# Patient Record
Sex: Female | Born: 1945 | Race: White | Hispanic: No | Marital: Married | Smoking: Never smoker
Health system: Southern US, Community
[De-identification: ages and names within clinical notes are randomized; demographics above are authoritative.]

## PROBLEM LIST (undated history)

## (undated) DIAGNOSIS — J302 Other seasonal allergic rhinitis: Secondary | ICD-10-CM

## (undated) DIAGNOSIS — F32A Depression, unspecified: Secondary | ICD-10-CM

## (undated) DIAGNOSIS — J45909 Unspecified asthma, uncomplicated: Secondary | ICD-10-CM

## (undated) DIAGNOSIS — E785 Hyperlipidemia, unspecified: Secondary | ICD-10-CM

## (undated) DIAGNOSIS — F419 Anxiety disorder, unspecified: Secondary | ICD-10-CM

## (undated) DIAGNOSIS — G4700 Insomnia, unspecified: Secondary | ICD-10-CM

## (undated) DIAGNOSIS — I4510 Unspecified right bundle-branch block: Secondary | ICD-10-CM

## (undated) DIAGNOSIS — R410 Disorientation, unspecified: Secondary | ICD-10-CM

## (undated) DIAGNOSIS — F329 Major depressive disorder, single episode, unspecified: Secondary | ICD-10-CM

## (undated) HISTORY — DX: Insomnia, unspecified: G47.00

## (undated) HISTORY — DX: Hyperlipidemia, unspecified: E78.5

## (undated) HISTORY — PX: COLONOSCOPY: SHX174

## (undated) HISTORY — DX: Unspecified asthma, uncomplicated: J45.909

## (undated) HISTORY — DX: Unspecified right bundle-branch block: I45.10

## (undated) HISTORY — DX: Depression, unspecified: F32.A

## (undated) HISTORY — DX: Anxiety disorder, unspecified: F41.9

## (undated) HISTORY — DX: Other seasonal allergic rhinitis: J30.2

## (undated) HISTORY — DX: Disorientation, unspecified: R41.0

## (undated) HISTORY — DX: Major depressive disorder, single episode, unspecified: F32.9

---

## 1997-12-30 ENCOUNTER — Other Ambulatory Visit: Admission: RE | Admit: 1997-12-30 | Discharge: 1997-12-30 | Payer: Self-pay | Admitting: Gynecology

## 1998-07-18 ENCOUNTER — Other Ambulatory Visit: Admission: RE | Admit: 1998-07-18 | Discharge: 1998-07-18 | Payer: Self-pay | Admitting: Gynecology

## 1999-01-05 ENCOUNTER — Ambulatory Visit (HOSPITAL_COMMUNITY): Admission: RE | Admit: 1999-01-05 | Discharge: 1999-01-05 | Payer: Self-pay | Admitting: Gastroenterology

## 1999-09-25 ENCOUNTER — Other Ambulatory Visit: Admission: RE | Admit: 1999-09-25 | Discharge: 1999-09-25 | Payer: Self-pay | Admitting: Gynecology

## 2000-01-14 ENCOUNTER — Other Ambulatory Visit: Admission: RE | Admit: 2000-01-14 | Discharge: 2000-01-14 | Payer: Self-pay | Admitting: Gynecology

## 2000-09-26 ENCOUNTER — Other Ambulatory Visit: Admission: RE | Admit: 2000-09-26 | Discharge: 2000-09-26 | Payer: Self-pay | Admitting: Gynecology

## 2001-09-21 ENCOUNTER — Other Ambulatory Visit: Admission: RE | Admit: 2001-09-21 | Discharge: 2001-09-21 | Payer: Self-pay | Admitting: Gynecology

## 2002-10-18 ENCOUNTER — Other Ambulatory Visit: Admission: RE | Admit: 2002-10-18 | Discharge: 2002-10-18 | Payer: Self-pay | Admitting: Gynecology

## 2003-10-21 ENCOUNTER — Other Ambulatory Visit: Admission: RE | Admit: 2003-10-21 | Discharge: 2003-10-21 | Payer: Self-pay | Admitting: Gynecology

## 2004-07-22 ENCOUNTER — Ambulatory Visit: Payer: Self-pay | Admitting: Internal Medicine

## 2004-10-06 ENCOUNTER — Ambulatory Visit (HOSPITAL_COMMUNITY): Admission: RE | Admit: 2004-10-06 | Discharge: 2004-10-06 | Payer: Self-pay | Admitting: Internal Medicine

## 2004-10-06 ENCOUNTER — Ambulatory Visit: Payer: Self-pay | Admitting: Internal Medicine

## 2004-11-30 ENCOUNTER — Other Ambulatory Visit: Admission: RE | Admit: 2004-11-30 | Discharge: 2004-11-30 | Payer: Self-pay | Admitting: Gynecology

## 2004-12-24 ENCOUNTER — Ambulatory Visit: Payer: Self-pay | Admitting: Internal Medicine

## 2005-02-01 ENCOUNTER — Ambulatory Visit: Payer: Self-pay | Admitting: Internal Medicine

## 2005-12-27 ENCOUNTER — Ambulatory Visit: Payer: Self-pay | Admitting: Internal Medicine

## 2005-12-27 ENCOUNTER — Other Ambulatory Visit: Admission: RE | Admit: 2005-12-27 | Discharge: 2005-12-27 | Payer: Self-pay | Admitting: Gynecology

## 2006-01-06 ENCOUNTER — Ambulatory Visit: Payer: Self-pay | Admitting: Endocrinology

## 2006-01-10 ENCOUNTER — Ambulatory Visit: Payer: Self-pay | Admitting: Internal Medicine

## 2006-02-07 ENCOUNTER — Ambulatory Visit: Payer: Self-pay | Admitting: Pulmonary Disease

## 2006-02-16 ENCOUNTER — Ambulatory Visit: Payer: Self-pay | Admitting: Pulmonary Disease

## 2006-12-02 ENCOUNTER — Encounter: Payer: Self-pay | Admitting: Endocrinology

## 2006-12-02 DIAGNOSIS — F418 Other specified anxiety disorders: Secondary | ICD-10-CM | POA: Insufficient documentation

## 2007-02-06 ENCOUNTER — Ambulatory Visit: Payer: Self-pay | Admitting: Internal Medicine

## 2007-02-06 DIAGNOSIS — M79609 Pain in unspecified limb: Secondary | ICD-10-CM | POA: Insufficient documentation

## 2007-02-06 DIAGNOSIS — G4700 Insomnia, unspecified: Secondary | ICD-10-CM | POA: Insufficient documentation

## 2007-02-07 ENCOUNTER — Encounter: Payer: Self-pay | Admitting: Internal Medicine

## 2007-02-07 ENCOUNTER — Telehealth: Payer: Self-pay | Admitting: Internal Medicine

## 2007-03-17 ENCOUNTER — Ambulatory Visit: Payer: Self-pay | Admitting: Internal Medicine

## 2007-03-20 ENCOUNTER — Encounter (INDEPENDENT_AMBULATORY_CARE_PROVIDER_SITE_OTHER): Payer: Self-pay | Admitting: *Deleted

## 2007-04-03 ENCOUNTER — Encounter: Admission: RE | Admit: 2007-04-03 | Discharge: 2007-04-03 | Payer: Self-pay | Admitting: Obstetrics

## 2007-04-03 ENCOUNTER — Encounter: Payer: Self-pay | Admitting: Internal Medicine

## 2007-04-03 ENCOUNTER — Ambulatory Visit: Payer: Self-pay | Admitting: Internal Medicine

## 2007-07-11 ENCOUNTER — Encounter: Payer: Self-pay | Admitting: Internal Medicine

## 2007-08-24 ENCOUNTER — Encounter: Payer: Self-pay | Admitting: Internal Medicine

## 2007-08-28 ENCOUNTER — Ambulatory Visit: Payer: Self-pay | Admitting: Internal Medicine

## 2007-08-28 DIAGNOSIS — J069 Acute upper respiratory infection, unspecified: Secondary | ICD-10-CM | POA: Insufficient documentation

## 2007-09-06 ENCOUNTER — Encounter: Payer: Self-pay | Admitting: Internal Medicine

## 2007-09-07 ENCOUNTER — Ambulatory Visit: Payer: Self-pay | Admitting: Internal Medicine

## 2007-09-09 LAB — CONVERTED CEMR LAB
BUN: 11 mg/dL (ref 6–23)
Basophils Absolute: 0.1 10*3/uL (ref 0.0–0.1)
Calcium: 10 mg/dL (ref 8.4–10.5)
Creatinine, Ser: 0.8 mg/dL (ref 0.4–1.2)
Direct LDL: 117.8 mg/dL
Eosinophils Relative: 2.3 % (ref 0.0–5.0)
GFR calc Af Amer: 94 mL/min
GFR calc non Af Amer: 78 mL/min
Glucose, Bld: 105 mg/dL — ABNORMAL HIGH (ref 70–99)
HDL: 57.7 mg/dL (ref >39.0)
Lymphocytes Relative: 28 % (ref 12.0–46.0)
MCV: 87.4 fL (ref 78.0–100.0)
Monocytes Relative: 10.3 % (ref 3.0–12.0)
Neutro Abs: 3.7 10*3/uL (ref 1.4–7.7)
Neutrophils Relative %: 58.6 % (ref 43.0–77.0)
RBC: 4.38 M/uL (ref 3.87–5.11)
Triglycerides: 182 mg/dL — ABNORMAL HIGH (ref 0–149)
WBC: 6.4 10*3/uL (ref 4.5–10.5)

## 2007-09-28 ENCOUNTER — Ambulatory Visit: Payer: Self-pay | Admitting: Internal Medicine

## 2007-10-27 ENCOUNTER — Telehealth: Payer: Self-pay | Admitting: Internal Medicine

## 2008-04-15 ENCOUNTER — Ambulatory Visit: Payer: Self-pay | Admitting: Internal Medicine

## 2008-04-15 LAB — CONVERTED CEMR LAB
ALT: 71 units/L — ABNORMAL HIGH (ref 0–35)
AST: 41 units/L — ABNORMAL HIGH (ref 0–37)
Albumin: 4.1 g/dL (ref 3.5–5.2)
Alkaline Phosphatase: 104 units/L (ref 39–117)
Cholesterol: 201 mg/dL (ref 0–200)
Triglycerides: 125 mg/dL (ref 0–149)

## 2008-04-16 ENCOUNTER — Telehealth: Payer: Self-pay | Admitting: Internal Medicine

## 2008-04-30 ENCOUNTER — Ambulatory Visit: Payer: Self-pay | Admitting: Internal Medicine

## 2008-04-30 DIAGNOSIS — R945 Abnormal results of liver function studies: Secondary | ICD-10-CM

## 2008-05-08 ENCOUNTER — Encounter: Admission: RE | Admit: 2008-05-08 | Discharge: 2008-05-08 | Payer: Self-pay | Admitting: Internal Medicine

## 2008-05-14 ENCOUNTER — Telehealth: Payer: Self-pay | Admitting: Internal Medicine

## 2008-05-14 DIAGNOSIS — N281 Cyst of kidney, acquired: Secondary | ICD-10-CM | POA: Insufficient documentation

## 2008-05-24 ENCOUNTER — Encounter: Admission: RE | Admit: 2008-05-24 | Discharge: 2008-05-24 | Payer: Self-pay | Admitting: Internal Medicine

## 2008-06-07 ENCOUNTER — Encounter: Payer: Self-pay | Admitting: Internal Medicine

## 2008-07-18 ENCOUNTER — Ambulatory Visit: Payer: Self-pay | Admitting: Endocrinology

## 2008-07-18 DIAGNOSIS — R109 Unspecified abdominal pain: Secondary | ICD-10-CM

## 2008-07-18 LAB — CONVERTED CEMR LAB
Bilirubin Urine: NEGATIVE
Glucose, Urine, Semiquant: NEGATIVE
Nitrite: NEGATIVE
Protein, U semiquant: NEGATIVE
Specific Gravity, Urine: 1.02

## 2008-07-31 ENCOUNTER — Ambulatory Visit: Payer: Self-pay | Admitting: Internal Medicine

## 2008-07-31 LAB — CONVERTED CEMR LAB
AST: 25 units/L (ref 0–37)
Albumin: 4.2 g/dL (ref 3.5–5.2)
BUN: 14 mg/dL (ref 6–23)
Calcium: 10.2 mg/dL (ref 8.4–10.5)
Cholesterol: 204 mg/dL — ABNORMAL HIGH (ref 0–200)
GFR calc non Af Amer: 77.52 mL/min (ref >60)
Glucose, Bld: 99 mg/dL (ref 70–99)
Sodium: 144 meq/L (ref 135–145)
Total CHOL/HDL Ratio: 3
Total Protein: 7.4 g/dL (ref 6.0–8.3)
Triglycerides: 96 mg/dL (ref 0.0–149.0)
VLDL: 19.2 mg/dL (ref 0.0–40.0)

## 2008-08-06 ENCOUNTER — Ambulatory Visit: Payer: Self-pay | Admitting: Internal Medicine

## 2008-08-07 ENCOUNTER — Encounter: Payer: Self-pay | Admitting: Internal Medicine

## 2009-02-26 ENCOUNTER — Telehealth: Payer: Self-pay | Admitting: Internal Medicine

## 2009-03-22 HISTORY — PX: COLONOSCOPY: SHX174

## 2009-03-24 ENCOUNTER — Ambulatory Visit: Payer: Self-pay | Admitting: Internal Medicine

## 2009-03-25 LAB — CONVERTED CEMR LAB
Albumin: 3.8 g/dL (ref 3.5–5.2)
Alkaline Phosphatase: 93 units/L (ref 39–117)
BUN: 17 mg/dL (ref 6–23)
Bilirubin, Direct: 0.1 mg/dL (ref 0.0–0.3)
CO2: 31 meq/L (ref 19–32)
Cholesterol: 198 mg/dL (ref 0–200)
LDL Cholesterol: 100 mg/dL — ABNORMAL HIGH (ref 0–99)
Total Bilirubin: 0.8 mg/dL (ref 0.3–1.2)
Total CHOL/HDL Ratio: 3
Total Protein: 7.3 g/dL (ref 6.0–8.3)

## 2009-03-26 ENCOUNTER — Ambulatory Visit: Payer: Self-pay | Admitting: Internal Medicine

## 2009-03-28 ENCOUNTER — Ambulatory Visit (HOSPITAL_COMMUNITY): Admission: RE | Admit: 2009-03-28 | Discharge: 2009-03-28 | Payer: Self-pay | Admitting: Internal Medicine

## 2009-04-10 ENCOUNTER — Telehealth: Payer: Self-pay | Admitting: Internal Medicine

## 2009-05-19 ENCOUNTER — Ambulatory Visit: Payer: Self-pay | Admitting: Internal Medicine

## 2009-05-19 DIAGNOSIS — G8929 Other chronic pain: Secondary | ICD-10-CM | POA: Insufficient documentation

## 2009-05-19 DIAGNOSIS — M25569 Pain in unspecified knee: Secondary | ICD-10-CM

## 2009-05-19 DIAGNOSIS — M25551 Pain in right hip: Secondary | ICD-10-CM | POA: Insufficient documentation

## 2009-05-19 DIAGNOSIS — R079 Chest pain, unspecified: Secondary | ICD-10-CM

## 2009-05-22 ENCOUNTER — Telehealth: Payer: Self-pay | Admitting: Internal Medicine

## 2009-10-06 ENCOUNTER — Ambulatory Visit: Payer: Self-pay | Admitting: Internal Medicine

## 2009-10-07 LAB — CONVERTED CEMR LAB
ALT: 21 units/L (ref 0–35)
AST: 24 units/L (ref 0–37)
Albumin: 4.3 g/dL (ref 3.5–5.2)
Alkaline Phosphatase: 86 units/L (ref 39–117)
Bilirubin Urine: NEGATIVE
Bilirubin, Direct: 0.1 mg/dL (ref 0.0–0.3)
CO2: 31 meq/L (ref 19–32)
Calcium: 10 mg/dL (ref 8.4–10.5)
Chloride: 109 meq/L (ref 96–112)
Cholesterol: 206 mg/dL — ABNORMAL HIGH (ref 0–200)
Direct LDL: 123.9 mg/dL
Eosinophils Relative: 1.6 % (ref 0.0–5.0)
GFR calc non Af Amer: 75.05 mL/min (ref >60)
Glucose, Bld: 99 mg/dL (ref 70–99)
HDL: 66.3 mg/dL (ref >39.00)
Hemoglobin, Urine: NEGATIVE
Lymphocytes Relative: 31.6 % (ref 12.0–46.0)
MCHC: 34.4 g/dL (ref 30.0–36.0)
MCV: 87.7 fL (ref 78.0–100.0)
Monocytes Absolute: 0.4 10*3/uL (ref 0.1–1.0)
Monocytes Relative: 8.5 % (ref 3.0–12.0)
Neutrophils Relative %: 57.6 % (ref 43.0–77.0)
Nitrite: NEGATIVE
Platelets: 260 10*3/uL (ref 150.0–400.0)
RBC: 4.18 M/uL (ref 3.87–5.11)
Sodium: 143 meq/L (ref 135–145)
Specific Gravity, Urine: >=1.03 (ref 1.000–1.030)
TSH: 1.56 microintl units/mL (ref 0.35–5.50)
Total Protein, Urine: NEGATIVE mg/dL
Total Protein: 6.8 g/dL (ref 6.0–8.3)
Triglycerides: 82 mg/dL (ref 0.0–149.0)

## 2009-10-10 ENCOUNTER — Ambulatory Visit: Payer: Self-pay | Admitting: Internal Medicine

## 2009-10-28 ENCOUNTER — Encounter (INDEPENDENT_AMBULATORY_CARE_PROVIDER_SITE_OTHER): Payer: Self-pay | Admitting: *Deleted

## 2009-10-30 ENCOUNTER — Ambulatory Visit: Payer: Self-pay | Admitting: Internal Medicine

## 2009-11-21 ENCOUNTER — Ambulatory Visit: Payer: Self-pay | Admitting: Internal Medicine

## 2010-03-04 ENCOUNTER — Ambulatory Visit: Payer: Self-pay | Admitting: Internal Medicine

## 2010-04-23 NOTE — Assessment & Plan Note (Signed)
Summary: 6 MTH FU $50--STC   Vital Signs:  Patient profile:   65 year old female Height:      63 inches Temp:     97.9 degrees F oral Pulse rate:   69 / minute BP sitting:   100 / 70  (left arm)  Vitals Entered By: Tora Perches (March 26, 2009 11:02 AM) CC: f/u Is Patient Diabetic? No Comments pt refused to weight/vg   CC:  f/u.  History of Present Illness: F/u insomnia, hyperlipidemia, abn MRI finding  Preventive Screening-Counseling & Management  Alcohol-Tobacco     Smoking Status: never  Current Medications (verified): 1)  Lexapro 10 Mg  Tabs (Escitalopram Oxalate) .... Take 1 By Mouth Qd 2)  Aspir-Low 81 Mg Tbec (Aspirin) .Marland Kitchen.. 1 Once Daily Pc 3)  Vitamin D3 1000 Unit  Tabs (Cholecalciferol) .Marland Kitchen.. 1 Qd 4)  Simvastatin 40 Mg Tabs (Simvastatin) .... 2 By Mouth Qd 5)  Zolpidem Tartrate 10 Mg  Tabs (Zolpidem Tartrate) .... 1/2 or 1 By Mouth At [facility] Prn  Allergies (verified): No Known Drug Allergies  Past History:  Past Medical History: Last updated: 03/17/2007 Depression Insomnia Hyperlipidemia  GI Dr Kinnie Scales  Social History: Last updated: 02/06/2007 Occupation: Married Never Smoked  Review of Systems  The patient denies fever, chest pain, abdominal pain, severe indigestion/heartburn, and suspicious skin lesions.    Physical Exam  General:  overweight-appearing.   Eyes:  vision grossly intact.   Ears:  R ear normal and L ear normal.   Nose:  no external deformity and no external erythema.   Mouth:  good dentition and pharynx pink and moist.   Neck:  supple and no masses.   Lungs:  CTA Heart:  RRR Abdomen:  Bowel sounds positive,abdomen soft and non-tender without masses, organomegaly or hernias noted. Msk:  muscle bulk and strength are grossly mormal.  no obvious joint swelling.  gait is normal, painless, and steady.  there is slight tenderness at the left iliac crest.  Neurologic:  alert & oriented X3 and gait normal.   Psych:  Oriented X3,  normally interactive, good eye contact, not anxious appearing, and not depressed appearing.     Impression & Recommendations:  Problem # 1:  INSOMNIA, PERSISTENT (ICD-307.42) Assessment Unchanged On prescription therapy   Problem # 2:  HYPERLIPIDEMIA (ICD-272.4) Assessment: Improved  Her updated medication list for this problem includes:    Simvastatin 40 Mg Tabs (Simvastatin) .Marland Kitchen... 2 by mouth qd  Problem # 3:  RENAL CYST (ICD-593.2) Assessment: Comment Only Discussed. MRI odered The office visit took longer than 20 min with patient councelling for more than 50% of the 20 min    Complete Medication List: 1)  Lexapro 10 Mg Tabs (Escitalopram oxalate) .... Take 1 by mouth qd 2)  Aspir-low 81 Mg Tbec (Aspirin) .Marland Kitchen.. 1 once daily pc 3)  Vitamin D3 1000 Unit Tabs (Cholecalciferol) .Marland Kitchen.. 1 qd 4)  Simvastatin 40 Mg Tabs (Simvastatin) .... 2 by mouth qd 5)  Zolpidem Tartrate 10 Mg Tabs (Zolpidem tartrate) .... 1/2 or 1 by mouth at hs prn  Patient Instructions: 1)  Please schedule a follow-up appointment in 6 months well w/labs.

## 2010-04-23 NOTE — Assessment & Plan Note (Signed)
Summary: FELL-SATURDAY THIS PAST--DR AVP PT-DIFFICULT TO TAKE DEEP BRE...   Vital Signs:  Patient profile:   65 year old female Height:      63 inches (160.02 cm) O2 Sat:      96 % on Room air Temp:     98.5 degrees F (36.94 degrees C) oral Pulse rate:   68 / minute BP sitting:   100 / 72  (left arm) Cuff size:   regular  Vitals Entered ByMarland Kitchen Orlan Leavens (May 19, 2009 11:09 AM)  O2 Flow:  Room air CC: Fell on Sat ? (R) rib broken, also (L) knee pain Is Patient Diabetic? No Pain Assessment Patient in pain? yes     Location: (L) rib area and (L) knee Type: aching   Primary Care Provider:  Tresa Garter MD  CC:  Larey Seat on Sat ? (R) rib broken and also (L) knee pain.  History of Present Illness: here today with complaint of  pain in right ribs and left knee. onset of symptoms was 3 days ago. pain was sudden onset and now occurs in constant but waxing/waning pattern of pain intensity. problem precipitated by accidental fall when tripped over weight machine at gym problem associated with trouble deep breathing or performing sit-ups but not associated with swelling or trouble walking symptoms improved by ice and rest. symptoms worsened with "crunches". no prior hx of same symptoms.  wants to se if rib cracked "or if just bruised"  Current Medications (verified): 1)  Lexapro 10 Mg  Tabs (Escitalopram Oxalate) .... Take 1 By Mouth Qd 2)  Aspir-Low 81 Mg Tbec (Aspirin) .Marland Kitchen.. 1 Once Daily Pc 3)  Vitamin D3 1000 Unit  Tabs (Cholecalciferol) .Marland Kitchen.. 1 Qd 4)  Simvastatin 40 Mg Tabs (Simvastatin) .... 2 By Mouth Qd 5)  Zolpidem Tartrate 10 Mg  Tabs (Zolpidem Tartrate) .... 1/2 or 1 By Mouth At [facility] Prn  Allergies (verified): No Known Drug Allergies  Past History:  Past Medical History: Depression Insomnia Hyperlipidemia    GI Dr Kinnie Scales  Review of Systems  The patient denies syncope, headaches, and abdominal pain.    Physical Exam  General:  alert, well-developed,  well-nourished, and cooperative to examination.    Chest Wall:  brusing along right chest wall in mid axiollary line - ?5/6 region - tneder to touch/light palp Lungs:  normal respiratory effort, no intercostal retractions or use of accessory muscles; normal breath sounds bilaterally - no crackles and no wheezes.    Heart:  normal rate, regular rhythm, no murmur, and no rub. BLE without edema. Msk:  left knee: full range of motion, no joint effusion or swelling. no erythema or abnormal warmth. Stable to ligamentous testing. Nontender to palpation. Neurovascularly intact.    Impression & Recommendations:  Problem # 1:  RIB PAIN, RIGHT SIDED (ICD-786.50) ?rib fx s/p traumatic fall - check xray now - lung exam benign - advised avoidance of painful activities + use of NSAIDs or ice pack until pain improved Orders: T-Ribs Unilateral 2 Views (71100TC)  Problem # 2:  KNEE PAIN, LEFT (ICD-719.46)  exam benign - no ligmanetous or soft tissue injury evident -  FROM w/o swelling or pain -  NSAIDs/ice as needed  Her updated medication list for this problem includes:    Aspir-low 81 Mg Tbec (Aspirin) .Marland Kitchen... 1 once daily pc  Complete Medication List: 1)  Lexapro 10 Mg Tabs (Escitalopram oxalate) .... Take 1 by mouth qd 2)  Aspir-low 81 Mg Tbec (Aspirin) .Marland KitchenMarland KitchenMarland Kitchen 1  once daily pc 3)  Vitamin D3 1000 Unit Tabs (Cholecalciferol) .Marland Kitchen.. 1 qd 4)  Simvastatin 40 Mg Tabs (Simvastatin) .... 2 by mouth qd 5)  Zolpidem Tartrate 10 Mg Tabs (Zolpidem tartrate) .... 1/2 or 1 by mouth at hs prn  Patient Instructions: 1)  it was good to see you today. 2)  xray of ribs ordered today - your results will be called to you later today - 3)  use OTC advil or aleve for pain as needed and ice sore area(s) 2-3x/day as needed  4)  refrain from sit-ups or other painful activities for next 1-2 weeks until brusing improved 5)  if worsening pain or swelling or other problems, please let us know -

## 2010-04-23 NOTE — Procedures (Signed)
Summary: Colonoscopy  Patient: Alizah Sills Note: All result statuses are Final unless otherwise noted.  Tests: (1) Colonoscopy (COL)   COL Colonoscopy           DONE     [facility]     [address]     [place], [place]  [address]           COLONOSCOPY PROCEDURE REPORT           PATIENT:  Rebbecca, Osuna  MR#:  [id number]     BIRTHDATE:  April 20, 1945, 64 yrs. old  GENDER:  female     ENDOSCOPIST:  Iva Boop, MD, [facility]     REF. BY:  Linda Hedges. Plotnikov, M.D.     PROCEDURE DATE:  11/21/2009     PROCEDURE:  Colonoscopy 19147     ASA CLASS:  Class I     INDICATIONS:  Routine Risk Screening     MEDICATIONS:   Fentanyl 50 mcg IV, Versed 5 mg IV           DESCRIPTION OF PROCEDURE:   After the risks benefits and     alternatives of the procedure were thoroughly explained, informed     consent was obtained.  Digital rectal exam was performed and     revealed no abnormalities.   The LB [id number] endoscope     was introduced through the anus and advanced to the cecum, which     was identified by both the appendix and ileocecal valve, without     limitations.  The quality of the prep was excellent, using     MoviPrep.  The instrument was then slowly withdrawn as the colon     was fully examined. Insertion: 3:36 minutes Withdrawal: 9:44     minutes     <<PROCEDUREIMAGES>>           FINDINGS:  A normal appearing cecum, ileocecal valve, and     appendiceal orifice were identified. The ascending, hepatic     flexure, transverse, splenic flexure, descending, sigmoid colon,     and rectum appeared unremarkable.   Retroflexed views in the right     colon and rectum revealed no abnormalities.    The scope was then     withdrawn from the patient and the procedure completed.           COMPLICATIONS:  None     ENDOSCOPIC IMPRESSION:     1) Normal colonoscopy with excellent prep           REPEAT EXAM:  In 10 year(s) for routine screening colonoscopy.           Iva Boop, MD, [facility]           CC:  Linda Hedges. Plotnikov, MD and The Patient           n.     eSIGNED:   Iva Boop at 11/21/2009 02:44 PM           Sandre Kitty, [phone]  Note: An exclamation mark (!) indicates a result that was not dispersed into the flowsheet. Document Creation Date: 11/21/2009 2:45 PM _______________________________________________________________________  (1) Order result status: Final Collection or observation date-time: 11/21/2009 14:36 Requested date-time:  Receipt date-time:  Reported date-time:  Referring Physician:   Ordering Physician: Stan Head [phone]) Specimen Source:  Source: Launa Grill Order Number: [id number] Lab site:   Appended Document: Colonoscopy     Procedures Next Due Date:  Colonoscopy: 11/2019

## 2010-04-23 NOTE — Progress Notes (Signed)
Summary: REFILLS  Phone Note Call from Patient   Summary of Call: Patient is requesting a refill on celexa. She says insurance would not cover lexapro so she takes celexa 40mg  one daily. ALso needs refill on ambien and would like [facility] OK?  Initial call taken by: Lamar Sprinkles, CMA,  May 22, 2009 12:17 PM  Follow-up for Phone Call        Patient left message on triage that she also needs refill of her cholesterol med. Follow-up by: Lucious Groves,  May 23, 2009 12:10 PM  Additional Follow-up for Phone Call Additional follow up Details #1::        ok thx Additional Follow-up by: Tresa Garter MD,  May 23, 2009 1:15 PM    New/Updated Medications: CELEXA 40 MG TABS (CITALOPRAM HYDROBROMIDE) one daily Prescriptions: SIMVASTATIN 40 MG TABS (SIMVASTATIN) 2 by mouth qd  #60 x 12   Entered by:   Lamar Sprinkles, CMA   Authorized by:   Tresa Garter MD   Signed by:   Lamar Sprinkles, CMA on 05/23/2009   Method used:   [facility] to        [id number]       [address]       [place], [place]  [address]       Ph: [phone] or [phone]       Fax: [phone]   RxID:   [id number] CELEXA 40 MG TABS (CITALOPRAM HYDROBROMIDE) one daily  #90 x 1   Entered by:   Lamar Sprinkles, CMA   Authorized by:   Tresa Garter MD   Signed by:   Lamar Sprinkles, CMA on 05/23/2009   Method used:   [facility]        [facility]       [address]       [place], [place]  [address]       Ph: [phone] or [phone]       Fax: [phone]   RxID:   [id number]   Appended Document: REFILLS left message on voicemail to call back to office  Appended Document: REFILLS Spoke w/pt, ambien called in

## 2010-04-23 NOTE — Assessment & Plan Note (Signed)
Summary: CPX/ NWS #   Vital Signs:  Patient profile:   65 year old female Height:      63 inches Temp:     98.3 degrees F oral Pulse rate:   72 / minute Pulse rhythm:   regular Resp:     16 per minute BP sitting:   110 / 68  (left arm) Cuff size:   regular  Vitals Entered By: Lanier Prude, CMA(AAMA) (October 10, 2009 10:25 AM) CC: CPX Is Patient Diabetic? No   Primary Care Provider:  Tresa Garter MD  CC:  CPX.  History of Present Illness: The patient presents for a wellness examination   Preventive Screening-Counseling & Management  Caffeine-Diet-Exercise     Does Patient Exercise: yes  Current Medications (verified): 1)  Celexa 40 Mg Tabs (Citalopram Hydrobromide) .... One Daily 2)  Aspir-Low 81 Mg Tbec (Aspirin) .Marland Kitchen.. 1 Once Daily Pc 3)  Vitamin D3 1000 Unit  Tabs (Cholecalciferol) .Marland Kitchen.. 1 Qd 4)  Simvastatin 40 Mg Tabs (Simvastatin) .... 2 By Mouth Qd 5)  Zolpidem Tartrate 10 Mg  Tabs (Zolpidem Tartrate) .... 1/2 or 1 By Mouth At [facility] Prn  Allergies (verified): No Known Drug Allergies  Past History:  Past Surgical History: Last updated: 03/17/2007 Colon 2001 nl  Family History: Last updated: 02/06/2007 Family History of CAD Female 1st degree relative <60 Family History of CAD Female 1st degree relative <50 Family History Diabetes 1st degree relative  Past Medical History: Depression Insomnia Hyperlipidemia GI Dr Kinnie Scales  Social History: Occupation: Married Never Smoked Regular exercise-yes Does Patient Exercise:  yes  Review of Systems  The patient denies anorexia, fever, weight loss, weight gain, vision loss, decreased hearing, hoarseness, chest pain, syncope, dyspnea on exertion, peripheral edema, prolonged cough, headaches, hemoptysis, abdominal pain, melena, hematochezia, severe indigestion/heartburn, hematuria, incontinence, genital sores, muscle weakness, suspicious skin lesions, transient blindness, difficulty walking, depression, unusual  weight change, abnormal bleeding, enlarged lymph nodes, angioedema, and testicular masses.    Physical Exam  General:  alert, well-developed, well-nourished, and cooperative to examination.    Head:  Normocephalic and atraumatic without obvious abnormalities. No apparent alopecia or balding. Eyes:  vision grossly intact.   Ears:  R ear normal and L ear normal.   Nose:  no external deformity and no external erythema.   Mouth:  good dentition and pharynx pink and moist.   Neck:  supple and no masses.   Lungs:  normal respiratory effort, no intercostal retractions or use of accessory muscles; normal breath sounds bilaterally - no crackles and no wheezes.    Heart:  normal rate, regular rhythm, no murmur, and no rub. BLE without edema. Abdomen:  Bowel sounds positive,abdomen soft and non-tender without masses, organomegaly or hernias noted. Msk:  left knee: full range of motion, no joint effusion or swelling. no erythema or abnormal warmth. Stable to ligamentous testing. Nontender to palpation. Neurovascularly intact.  Pulses:  R and L carotid,radial,femoral,dorsalis pedis and posterior tibial pulses are full and equal bilaterally Extremities:  No clubbing, cyanosis, edema, or deformity noted with normal full range of motion of all joints.   Neurologic:  alert & oriented X3 and gait normal.   Skin:  Intact without suspicious lesions or rashes Cervical Nodes:  No lymphadenopathy noted Inguinal Nodes:  No significant adenopathy Psych:  Cognition and judgment appear intact. Alert and cooperative with normal attention span and concentration. No apparent delusions, illusions, hallucinations   Impression & Recommendations:  Problem # 1:  PHYSICAL EXAMINATION (ICD-V70.0) Assessment  New Health and age related issues were discussed. Available screening tests and vaccinations were discussed as well. Healthy life style including good diet and execise was discussed.  The labs were reviewed with the  patient.  Orders: Gastroenterology Referral (GI) Dr Kinnie Scales EKG w/ Interpretation (93000) RBBB  Problem # 2:  HYPERLIPIDEMIA (ICD-272.4) Assessment: Improved  The following medications were removed from the medication list:    Simvastatin 40 Mg Tabs (Simvastatin) .Marland Kitchen... 2 by mouth qd Her updated medication list for this problem includes:    Lipitor 40 Mg Tabs (Atorvastatin calcium) .Marland Kitchen... 1 by mouth qd  Problem # 3:  ABNORMAL LIVER FUNCTION TESTS (ICD-794.8) Assessment: Improved  Problem # 4:  DEPRESSION (ICD-311) Assessment: Unchanged  Her updated medication list for this problem includes:    Celexa 40 Mg Tabs (Citalopram hydrobromide) ..... One daily  Complete Medication List: 1)  Celexa 40 Mg Tabs (Citalopram hydrobromide) .... One daily 2)  Aspir-low 81 Mg Tbec (Aspirin) .Marland Kitchen.. 1 once daily pc 3)  Vitamin D3 1000 Unit Tabs (Cholecalciferol) .Marland Kitchen.. 1 qd 4)  Zolpidem Tartrate 10 Mg Tabs (Zolpidem tartrate) .... 1/2 or 1 by mouth at hs prn 5)  Lipitor 40 Mg Tabs (Atorvastatin calcium) .Marland Kitchen.. 1 by mouth qd  Other Orders: Tdap => 2yrs IM (98119) Admin 1st Vaccine (14782)  Patient Instructions: 1)  Please schedule a follow-up appointment in 1 year  well w/labs or in 6 mo f/u. 2)  Labs in 3 months  3)  Hepatic Panel prior to visit, ICD-9:995.20 4)  Lipid Panel prior to visit, ICD-9: Prescriptions: LIPITOR 40 MG TABS (ATORVASTATIN CALCIUM) 1 by mouth qd  #90 x 3   Entered and Authorized by:   Tresa Garter MD   Signed by:   Tresa Garter MD on 10/10/2009   Method used:   Print then Give to Patient   RxID:   [id number] CELEXA 40 MG TABS (CITALOPRAM HYDROBROMIDE) one daily  #90 x 3   Entered and Authorized by:   Tresa Garter MD   Signed by:   Tresa Garter MD on 10/10/2009   Method used:   Print then Give to Patient   RxID:   [id number] ZOLPIDEM TARTRATE 10 MG  TABS (ZOLPIDEM TARTRATE) 1/2 or 1 by mouth at hs prn  #90 x 3   Entered and  Authorized by:   Tresa Garter MD   Signed by:   Tresa Garter MD on 10/10/2009   Method used:   Print then Give to Patient   RxID:   [id number]    Immunizations Administered:  Tetanus Vaccine:    Vaccine Type: Tdap    Site: right deltoid    Mfr: GlaxoSmithKline    Dose: 0.5 ml    Route: IM    Given by: Lanier Prude, CMA(AAMA)    Exp. Date: 09/19/2011    Lot #: [id number]    VIS given: 02/07/07 version given October 10, 2009.

## 2010-04-23 NOTE — Progress Notes (Signed)
Summary: zolpidem  Phone Note Other Incoming Call back at fax   [phone] Summary of Call: refill on zolpidem  ok x 1 refill will fax back to the pharmacy/vg  Initial call taken by: Tora Perches,  April 10, 2009 1:32 PM    Prescriptions: ZOLPIDEM TARTRATE 10 MG  TABS (ZOLPIDEM TARTRATE) 1/2 or 1 by mouth at hs prn  #30 x 1   Entered by:   Tora Perches   Authorized by:   Tresa Garter MD   Signed by:   Tora Perches on 04/10/2009   Method used:   Telephoned to ...       [id number]       [address]       [place], [place]  [address]       Ph: [phone] or [phone]       Fax: [phone]   RxID:   [id number]

## 2010-04-23 NOTE — Miscellaneous (Signed)
Summary: [facility]/prep  Clinical Lists Changes  Medications: Added new medication of MOVIPREP 100 GM  SOLR (PEG-KCL-NACL-NASULF-NA ASC-C) As per prep instructions. - Signed Rx of MOVIPREP 100 GM  SOLR (PEG-KCL-NACL-NASULF-NA ASC-C) As per prep instructions.;  #1 x 0;  Signed;  Entered by: Wyona Almas RN;  Authorized by: Iva Boop MD, FACG;  Method used: [facility] [address], [place], [place]  [address], Ph: [phone] or [phone], Fax: [phone] Observations: Added new observation of NKA: T (10/30/2009 10:52)    Prescriptions: MOVIPREP 100 GM  SOLR (PEG-KCL-NACL-NASULF-NA ASC-C) As per prep instructions.  #1 x 0   Entered by:   Wyona Almas RN   Authorized by:   Iva Boop MD, [facility]   Signed by:   Wyona Almas RN on 10/30/2009   Method used:   Electronically to        [facility]       [address]       [place], [place]  [address]       Ph: [phone] or [phone]       Fax: [phone]   RxID:   [id number]

## 2010-04-23 NOTE — Assessment & Plan Note (Signed)
Summary: gastro issues-lb   Vital Signs:  Patient profile:   65 year old female Height:      63 inches Temp:     98.2 degrees F oral Pulse rate:   76 / minute Pulse rhythm:   regular Resp:     16 per minute BP sitting:   120 / 80  (left arm) Cuff size:   regular  Vitals Entered By: Lanier Prude, Beverly Gust) (March 04, 2010 4:17 PM) CC: Heartburn. "feels like bread gets stuck" Is Patient Diabetic? No   Primary Care Fotini Lemus:  Tresa Garter MD  CC:  Heartburn. "feels like bread gets stuck".  History of Present Illness: C/o GERD and bread getting stuck in the esopagus x 4-6 weeks.  Current Medications (verified): 1)  Celexa 40 Mg Tabs (Citalopram Hydrobromide) .... One Daily 2)  Aspir-Low 81 Mg Tbec (Aspirin) .Marland Kitchen.. 1 Once Daily Pc 3)  Vitamin D3 1000 Unit  Tabs (Cholecalciferol) .Marland Kitchen.. 1 Qd 4)  Zolpidem Tartrate 10 Mg  Tabs (Zolpidem Tartrate) .... 1/2 or 1 By Mouth At [facility] Prn 5)  Lipitor 40 Mg Tabs (Atorvastatin Calcium) .Marland Kitchen.. 1 By Mouth Qd 6)  Tums 500 Mg Chew (Calcium Carbonate Antacid) .... As Needed  Allergies (verified): No Known Drug Allergies  Past History:  Past Surgical History: Last updated: 03/17/2007 Colon 2001 nl  Social History: Last updated: 10/10/2009 Occupation: Married Never Smoked Regular exercise-yes  Past Medical History: Depression Insomnia Hyperlipidemia GI Dr Kinnie Scales GERD  Review of Systems       The patient complains of fever, weight loss, weight gain, and severe indigestion/heartburn.  The patient denies anorexia, chest pain, dyspnea on exertion, abdominal pain, melena, and hematochezia.    Physical Exam  General:  alert, well-developed, well-nourished, and cooperative to examination.    Head:  Normocephalic and atraumatic without obvious abnormalities. No apparent alopecia or balding. Mouth:  good dentition and pharynx pink and moist.   Neck:  supple and no masses.   Lungs:  normal respiratory effort, no intercostal  retractions or use of accessory muscles; normal breath sounds bilaterally - no crackles and no wheezes.    Heart:  normal rate, regular rhythm, no murmur, and no rub. BLE without edema. Abdomen:  Bowel sounds positive,abdomen soft and non-tender without masses, organomegaly or hernias noted. Msk:  left knee: full range of motion, no joint effusion or swelling. no erythema or abnormal warmth. Stable to ligamentous testing. Nontender to palpation. Neurovascularly intact.  Neurologic:  alert & oriented X3 and gait normal.   Skin:  Intact without suspicious lesions or rashes Psych:  Cognition and judgment appear intact. Alert and cooperative with normal attention span and concentration. No apparent delusions, illusions, hallucinations   Impression & Recommendations:  Problem # 1:  DYSPHAGIA UNSPECIFIED (ICD-787.20) Assessment New  Orders: Gastroenterology Referral (GI)  Problem # 2:  GERD (ICD-530.81) Assessment: New D/c MVI Her updated medication list for this problem includes:    Tums 500 Mg Chew (Calcium carbonate antacid) .Marland Kitchen... As needed    Omeprazole 40 Mg Cpdr (Omeprazole) .Marland Kitchen... 1 by mouth bid  Orders: Gastroenterology Referral (GI)  Problem # 3:  HYPERLIPIDEMIA (ICD-272.4) Assessment: Unchanged  Her updated medication list for this problem includes:    Lipitor 40 Mg Tabs (Atorvastatin calcium) .Marland Kitchen... 1 by mouth qd  Problem # 4:  DEPRESSION (ICD-311) Assessment: Unchanged  Her updated medication list for this problem includes:    Celexa 40 Mg Tabs (Citalopram hydrobromide) ..... One daily  Complete Medication List: 1)  Celexa 40 Mg Tabs (Citalopram hydrobromide) .... One daily 2)  Aspir-low 81 Mg Tbec (Aspirin) .Marland Kitchen.. 1 once daily pc 3)  Vitamin D3 1000 Unit Tabs (Cholecalciferol) .Marland Kitchen.. 1 qd 4)  Zolpidem Tartrate 10 Mg Tabs (Zolpidem tartrate) .... 1/2 or 1 by mouth at hs prn 5)  Lipitor 40 Mg Tabs (Atorvastatin calcium) .Marland Kitchen.. 1 by mouth qd 6)  Tums 500 Mg Chew (Calcium  carbonate antacid) .... As needed 7)  Omeprazole 40 Mg Cpdr (Omeprazole) .Marland Kitchen.. 1 by mouth bid  Patient Instructions: 1)  Call if you are not better in a reasonable amount of time or if worse.  Prescriptions: ZOLPIDEM TARTRATE 10 MG  TABS (ZOLPIDEM TARTRATE) 1/2 or 1 by mouth at hs prn  #30 x 6   Entered and Authorized by:   Tresa Garter MD   Signed by:   Tresa Garter MD on 03/04/2010   Method used:   Print then Give to Patient   RxID:   [id number] LIPITOR 40 MG TABS (ATORVASTATIN CALCIUM) 1 by mouth qd  #30 x 11   Entered and Authorized by:   Tresa Garter MD   Signed by:   Tresa Garter MD on 03/04/2010   Method used:   Electronically to        [id number] (ret[facility])       [address]       [place], [place]  [address]       Ph: [phone] or [phone]       Fax: [phone]   RxID:   [id number] CELEXA 40 MG TABS (CITALOPRAM HYDROBROMIDE) one daily  #30 x 11   Entered and Authorized by:   Tresa Garter MD   Signed by:   Tresa Garter MD on 03/04/2010   Method used:   Electronically to        [id number] (retail)       [address]       [place], [place]  [address]       Ph: [phone] or [phone]       Fax: [phone]   RxID:   [id number] OMEPRAZOLE 40 MG CPDR (OMEPRAZOLE) 1 by mouth bid  #60 x 11   Entered and Authorized by:   Tresa Garter MD   Signed by:   Tresa Garter MD on 03/04/2010   Method used:   Electronically to        [id number] (retail)       [address]       [place], [place]  [address]       Ph: [phone] or [phone]       Fax: [phone]   RxID:   [id number]    Orders Added: 1)  Gastroenterology Referral [GI] 2)  Est. Patient Level IV [585[address]]

## 2010-04-23 NOTE — Letter (Signed)
Summary: Moviprep Instructions  [facility]  [address]   [place], [place] [address]   Phone: [phone]  Fax: [phone]       [place]elyn [place]    Sep 13, 1945    MRN: [id number]        Procedure Day /Date:  Friday   11-21-09     Arrival Time:  1:00 p.m.     Procedure Time:  2:00 p.m.     Location of Procedure:                    x   [facility] (4th Floor)                        PREPARATION FOR COLONOSCOPY WITH MOVIPREP   Starting 5 days prior to your procedure  11-16-09 do not eat nuts, seeds, popcorn, corn, beans, peas,  salads, or any raw vegetables.  Do not take any fiber supplements (e.g. Metamucil, Citrucel, and Benefiber).  THE DAY BEFORE YOUR PROCEDURE         DATE:  11-20-09  DAY: Thursday  1.  Drink clear liquids the entire day-NO SOLID FOOD  2.  Do not drink anything colored red or purple.  Avoid juices with pulp.  No orange juice.  3.  Drink at least 64 oz. (8 glasses) of fluid/clear liquids during the day to prevent dehydration and help the prep work efficiently.  CLEAR LIQUIDS INCLUDE: Water Jello Ice Popsicles Tea (sugar ok, no milk/cream) Powdered fruit flavored drinks Coffee (sugar ok, no milk/cream) Gatorade Juice: apple, white grape, white cranberry  Lemonade Clear bullion, consomm, broth Carbonated beverages (any kind) Strained chicken noodle soup Hard Candy                             4.  In the morning, mix first dose of MoviPrep solution:    Empty 1 Pouch A and 1 Pouch B into the disposable container    Add lukewarm drinking water to the top line of the container. Mix to dissolve    Refrigerate (mixed solution should be used within 24 hrs)  5.  Begin drinking the prep at 5:00 p.m. The MoviPrep container is divided by 4 marks.   Every 15 minutes drink the solution down to the next mark (approximately 8 oz) until the full liter is complete.   6.  Follow completed prep with 16 oz of clear liquid of your choice  (Nothing red or purple).  Continue to drink clear liquids until bedtime.  7.  Before going to bed, mix second dose of MoviPrep solution:    Empty 1 Pouch A and 1 Pouch B into the disposable container    Add lukewarm drinking water to the top line of the container. Mix to dissolve    Refrigerate  THE DAY OF YOUR PROCEDURE      DATE: 11-21-09  DAY: Friday  Beginning at  9:00 a.m. (5 hours before procedure):         1. Every 15 minutes, drink the solution down to the next mark (approx 8 oz) until the full liter is complete.  2. Follow completed prep with 16 oz. of clear liquid of your choice.    3. You may drink clear liquids until 12:00 p.m.   (2 HOURS BEFORE PROCEDURE).   MEDICATION INSTRUCTIONS  Unless otherwise instructed, you should take regular prescription medications with a small sip  of water   as early as possible the morning of your procedure.          OTHER INSTRUCTIONS  You will need a responsible adult at least 63 years of age to accompany you and drive you home.   This person must remain in the waiting room during your procedure.  Wear loose fitting clothing that is easily removed.  Leave jewelry and other valuables at home.  However, you may wish to bring a book to read or  an iPod/MP3 player to listen to music as you wait for your procedure to start.  Remove all body piercing jewelry and leave at home.  Total time from sign-in until discharge is approximately 2-3 hours.  You should go home directly after your procedure and rest.  You can resume normal activities the  day after your procedure.  The day of your procedure you should not:   Drive   Make legal decisions   Operate machinery   Drink alcohol   Return to work  You will receive specific instructions about eating, activities and medications before you leave.    The above instructions have been reviewed and explained to me by   Evelyn Almas RN  October 30, 2009 11:27 AM     I fully  understand and can verbalize these instructions _____________________________ Date _________

## 2010-04-24 ENCOUNTER — Encounter (INDEPENDENT_AMBULATORY_CARE_PROVIDER_SITE_OTHER): Payer: Self-pay | Admitting: *Deleted

## 2010-04-29 NOTE — Letter (Addendum)
Summary: Referral - not able to see patient  [facility]  [address] [place], [place] [address]   Phone: [phone]  Fax: [phone]    April 24, 2010  Jacinta Shoe, MD [address] [place], [place] [address]   Re:   [place] DOB:  1945/11/27 MRN:   [id number]    Dear Dr. Posey Rea:  Thank you for your kind referral of the above patient.  We have attempted to schedule the recommended procedure EGD but have not been able to schedule because:  X   The patient was not available by phone and/or has not returned our calls.  ___ The patient declined to schedule the procedure at this time.  We appreciate the referral and hope that we will have the opportunity to treat this patient in the future.    Sincerely,    [facility] [phone]

## 2010-06-01 ENCOUNTER — Other Ambulatory Visit: Payer: Self-pay

## 2010-06-01 ENCOUNTER — Other Ambulatory Visit: Payer: Self-pay | Admitting: Internal Medicine

## 2010-06-01 ENCOUNTER — Encounter (INDEPENDENT_AMBULATORY_CARE_PROVIDER_SITE_OTHER): Payer: Self-pay | Admitting: *Deleted

## 2010-06-01 DIAGNOSIS — T887XXA Unspecified adverse effect of drug or medicament, initial encounter: Secondary | ICD-10-CM

## 2010-06-01 DIAGNOSIS — Z79899 Other long term (current) drug therapy: Secondary | ICD-10-CM

## 2010-06-01 LAB — LIPID PANEL
Cholesterol: 191 mg/dL (ref 0–200)
HDL: 56.2 mg/dL (ref >39.00)
VLDL: 24.8 mg/dL (ref 0.0–40.0)

## 2010-06-01 LAB — HEPATIC FUNCTION PANEL
ALT: 29 U/L (ref 0–35)
AST: 26 U/L (ref 0–37)
Albumin: 4.2 g/dL (ref 3.5–5.2)
Alkaline Phosphatase: 101 U/L (ref 39–117)

## 2010-06-08 ENCOUNTER — Encounter: Payer: Self-pay | Admitting: Endocrinology

## 2010-06-08 ENCOUNTER — Ambulatory Visit (INDEPENDENT_AMBULATORY_CARE_PROVIDER_SITE_OTHER): Payer: Medicare HMO | Admitting: Endocrinology

## 2010-06-08 DIAGNOSIS — J069 Acute upper respiratory infection, unspecified: Secondary | ICD-10-CM

## 2010-06-16 ENCOUNTER — Other Ambulatory Visit: Payer: Self-pay | Admitting: Internal Medicine

## 2010-06-18 NOTE — Assessment & Plan Note (Signed)
Summary: SORE THROAT /NWS   Vital Signs:  Patient profile:   65 year old female Height:      63 inches (160.02 cm) O2 Sat:      96 % on Room air Temp:     98.6 degrees F (37.00 degrees C) oral Pulse rate:   69 / minute Pulse rhythm:   regular BP sitting:   102 / 74  (left arm) Cuff size:   regular  Vitals Entered By: Brenton Grills CMA Duncan Dull) (June 08, 2010 10:54 AM)  O2 Flow:  Room air CC: Sore throat, cough x 2 days/AVP pt/aj Is Patient Diabetic? No   Primary Provider:  Tresa Garter MD  CC:  Sore throat and cough x 2 days/AVP pt/aj.  History of Present Illness: 2 days of mod pain at the throat, and assoc slightly prod cough.  Current Medications (verified): 1)  Celexa 40 Mg Tabs (Citalopram Hydrobromide) .... One Daily 2)  Aspir-Low 81 Mg Tbec (Aspirin) .Marland Kitchen.. 1 Once Daily Pc 3)  Vitamin D3 1000 Unit  Tabs (Cholecalciferol) .Marland Kitchen.. 1 Qd 4)  Zolpidem Tartrate 10 Mg  Tabs (Zolpidem Tartrate) .... 1/2 or 1 By Mouth At [facility] Prn 5)  Lipitor 40 Mg Tabs (Atorvastatin Calcium) .Marland Kitchen.. 1 By Mouth Qd 6)  Tums 500 Mg Chew (Calcium Carbonate Antacid) .... As Needed 7)  Omeprazole 40 Mg Cpdr (Omeprazole) .Marland Kitchen.. 1 By Mouth Bid  Allergies (verified): No Known Drug Allergies  Past History:  Past Medical History: Last updated: 03/04/2010 Depression Insomnia Hyperlipidemia GI Dr Kinnie Scales GERD  Review of Systems  The patient denies dyspnea on exertion.         fever is resolved.    Physical Exam  General:  no distress  Head:  head: no deformity eyes: no periorbital swelling, no proptosis external nose and ears are normal mouth: no lesion seen Ears:  left tm is slightly red.  right is hard to see, due to sharp turn of the eac Neck:  Supple without thyroid enlargement or tenderness.  Lungs:  Clear to auscultation bilaterally. Normal respiratory effort.    Impression & Recommendations:  Problem # 1:  uri new  Problem # 2:  DEPRESSION (ICD-311) this is a high risk of  interaction between zithromax and celexa  Medications Added to Medication List This Visit: 1)  Azithromycin 500 Mg Tabs (Azithromycin) .Marland Kitchen.. 1 tab once daily 2)  Promethazine-codeine 6.25-10 Mg/70ml Syrp (Promethazine-codeine) .Marland Kitchen.. 1 taspoon every 4 hrs as needed for cough  Other Orders: Est. Patient Level IV (62130)  Patient Instructions: 1)  take azithromycin 500 mg once daily 2)  while on this, you should take just 1/2 of your citalopram pill. 3)  here is a prescription for cough syrup.   Prescriptions: PROMETHAZINE-CODEINE 6.25-10 MG/5ML SYRP (PROMETHAZINE-CODEINE) 1 taspoon every 4 hrs as needed for cough  #8 oz x 1   Entered and Authorized by:   Minus Breeding MD   Signed by:   Minus Breeding MD on 06/08/2010   Method used:   Print then Give to Patient   RxID:   [id number] AZITHROMYCIN 500 MG TABS (AZITHROMYCIN) 1 tab once daily  #6 x 0   Entered and Authorized by:   Minus Breeding MD   Signed by:   Minus Breeding MD on 06/08/2010   Method used:   Electronically to        [id number] ([facility]       [address]  [place], [place]  [address]       Ph: [phone] or [phone]       Fax: [phone]   RxID:   [id number]    Orders Added: 1)  Est. Patient Level IV [[address]]

## 2010-08-07 NOTE — Assessment & Plan Note (Signed)
[facility]                               PULMONARY OFFICE NOTE   NAME:Hallgren, KODI GUERRERA                       MRN:          [id number]  DATE:02/07/2006                            DOB:          [birth year]    HISTORY OF PRESENT ILLNESS:  This is a patient of Dr. Loren Racer, who has a  history of hyperlipidemia who presents for an acute office visit.  The  patient complains over the last 4 weeks she has had intermittent pain along  the right elbow.  She has pain with lifting objects, especially grocery  bags.  She had been working out exercising, lifting weights, and noticed  that she was having pain just on the right side whenever she tried to lift  something such as a bag of groceries.  She denies any neck or shoulder pain,  extremity weakness, tingling or paresthesias, or chest pain.   The patient also complains over the last 4 days she has had intermittent  left ear pain and swollen glands.  She denies any nasal congestion, sore  throat, fever, or change in hearing.   PAST MEDICAL HISTORY:  Reviewed.   CURRENT MEDICATIONS:  Reviewed.   PHYSICAL EXAM:  The patient is a pleasant female in no acute distress.  She  is afebrile with stable vital signs.  O2 saturation is 97% on room air.  HEENT:  Nasal mucosa is normal.  Posterior pharynx is clear.  Right TM and  EAC are normal.  Left EAC is red and swollen.  External pinna is slightly  swollen and tender to touch.  NECK:  Supple with a positive left anterior cervical adenopathy.  Lung sounds are clear.  CARDIAC:  Regular rate.  ABDOMEN:  Soft, benign.  EXTREMITIES:  Warm without any calf tenderness, clubbing, or edema.  The  upper extremities have equal strength bilaterally.  Normal hand grips.  The  patient has tenderness along the external right elbow.  No ecchymosis or  swelling is noted.  Normal hand grips.  Cervical range of motion and should range of motion are normal without any  radicular  symptoms.  Pulses are intact bilaterally.   IMPRESSION AND PLAN:  1. External and internal otitis media.  The patient is to begin Omnicef      times 7 days, Cipro otic HC drops 3 drops to the left ear twice daily      times 7 days.  The patient is to return if symptoms do not improve.  2. Right elbow pain suspect tendinitis.  The patient is to begin Motrin      600 mg three times daily      times 5 days.  Alternate ice and heat to the area.  The patient is to      follow back up with Dr. Posey Rea as scheduled or sooner if any      bruising.      Rubye Oaks, NP  Electronically Signed      Lonzo Cloud. Kriste Basque, MD  Electronically Signed   TP/MedQ  DD: 02/07/2006  DT: 02/07/2006  Job #:  [id number] 

## 2010-08-07 NOTE — Assessment & Plan Note (Signed)
[facility]                             PRIMARY CARE OFFICE NOTE   NAME:Evelyn [place], Evelyn Murphy                       MRN:          [id number]  DATE:01/10/2006                            DOB:          Nov 24, 1945    The patient is a 65 year old female who presents for a wellness examination.   ALLERGIES:  No known drug allergies.   Past medical history, family history, social history, as per October 17, 2003  note.   CURRENT MEDICATIONS:  1. Lipitor 20 mg daily.  2. Lexapro 10 mg daily.  3. Ambien 10 mg at night.   REVIEW OF SYSTEMS:  Problems with insomnia without sleeping pill.  No muscle  aches or pain.  Denies being depressed when on Lexapro.  Exercising five  days a week.  Regular GYN care with her gynecologist.  The rest is negative.   PHYSICAL EXAMINATION:  VITAL SIGNS:  Blood pressure 102/70, pulse 78, temp  98.9.  GENERAL:  She is in no acute distress.  Looks well.  HEENT:  Moist mucosa.  NECK:  Supple.  No thyromegaly or bruits.  LUNGS:  Clear to auscultation and percussion.  No wheezes or rales.  HEART:  S1 and S2.  No murmur, no gallop.  ABDOMEN:  Soft, nontender.  No organomegaly, no masses felt.  EXTREMITIES:  Lower extremities without edema.  She is alert, oriented and  cooperative.  Denies being depressed.  SKIN:  Clear.   Labs on December 27, 2005, CBC normal, CMET normal.  Cholesterol 241, LDL 161.  TSH normal.  Urinalysis normal.   ASSESSMENT/PLAN:  Normal wellness examination:  Age/health-related issues discussed.  Healthy  lifestyle discussed.  Mammogram is pending.  Bone density scan is pending.  She is getting Pap smears yearly with Dr. Chevis Pretty.  She will be due  colonoscopy in a couple of years.  Advised to take baby      aspirin daily, calcium with vitamin D.  1. Upper respiratory tract infection, recently treated.  She has had a      chest x-ray.  2. Dyslipidemia:  Suboptimal control.  Will increase Lipitor to 40 mg  daily as tolerated.  Obtain lipids __________ in 2-3 months, fasting.    ______________________________  Georgina Quint. Plotnikov, MD    AVP/MedQ  DD: 01/13/2006  DT: 01/14/2006  Job #: [id number]

## 2010-10-21 ENCOUNTER — Telehealth: Payer: Self-pay | Admitting: *Deleted

## 2010-10-21 NOTE — Telephone Encounter (Signed)
Pharm sent fax req RF of Zolpidem 10 mg 1/2 to 1 q hs prn # 30. OK?

## 2010-10-22 NOTE — Telephone Encounter (Signed)
RX called into pharmacy/ok per MD

## 2010-10-22 NOTE — Telephone Encounter (Signed)
One month only

## 2010-11-20 ENCOUNTER — Telehealth: Payer: Self-pay | Admitting: *Deleted

## 2010-11-20 MED ORDER — ZOLPIDEM TARTRATE 10 MG PO TABS: 10.0000 mg | ORAL_TABLET | Freq: Every evening | ORAL | Status: DC | PRN

## 2010-11-20 NOTE — Telephone Encounter (Signed)
rf req for Zolpidem 10mg  1/2-1 po qhs # 30. Last filled 10/22/10. Ok to Rf in AVP's absence?

## 2010-11-20 NOTE — Telephone Encounter (Signed)
Ok to refill x 5 

## 2011-03-16 ENCOUNTER — Other Ambulatory Visit: Payer: Self-pay | Admitting: Internal Medicine

## 2011-03-30 ENCOUNTER — Other Ambulatory Visit: Payer: Self-pay | Admitting: Internal Medicine

## 2011-04-26 ENCOUNTER — Ambulatory Visit: Payer: Medicare HMO | Admitting: Internal Medicine

## 2011-06-25 ENCOUNTER — Telehealth: Payer: Self-pay | Admitting: *Deleted

## 2011-06-25 MED ORDER — ZOLPIDEM TARTRATE 10 MG PO TABS: 10.0000 mg | ORAL_TABLET | Freq: Every evening | ORAL | Status: DC | PRN

## 2011-06-25 NOTE — Telephone Encounter (Signed)
OK to fill this prescription with additional refills x5 Thank you!  

## 2011-06-25 NOTE — Telephone Encounter (Signed)
Rx printed for MD signature

## 2011-06-25 NOTE — Telephone Encounter (Signed)
R'cd fax from [facility] for refill of Zolpidem-last written 11/20/2010 #30 with 5 refills-please advise on refill

## 2011-06-29 NOTE — Telephone Encounter (Signed)
Rx signed and faxed.

## 2011-07-02 ENCOUNTER — Other Ambulatory Visit (INDEPENDENT_AMBULATORY_CARE_PROVIDER_SITE_OTHER): Payer: Medicare HMO

## 2011-07-02 ENCOUNTER — Other Ambulatory Visit: Payer: Self-pay | Admitting: *Deleted

## 2011-07-02 ENCOUNTER — Telehealth: Payer: Self-pay

## 2011-07-02 DIAGNOSIS — E785 Hyperlipidemia, unspecified: Secondary | ICD-10-CM

## 2011-07-02 DIAGNOSIS — R945 Abnormal results of liver function studies: Secondary | ICD-10-CM

## 2011-07-02 LAB — CBC WITH DIFFERENTIAL/PLATELET
Basophils Absolute: 0.1 10*3/uL (ref 0.0–0.1)
Basophils Relative: 0.8 % (ref 0.0–3.0)
Eosinophils Absolute: 0.1 10*3/uL (ref 0.0–0.7)
HCT: 39.4 % (ref 36.0–46.0)
Hemoglobin: 12.9 g/dL (ref 12.0–15.0)
Lymphocytes Relative: 29.5 % (ref 12.0–46.0)
Monocytes Absolute: 0.4 10*3/uL (ref 0.1–1.0)
Neutro Abs: 3.8 10*3/uL (ref 1.4–7.7)
Platelets: 282 10*3/uL (ref 150.0–400.0)
RBC: 4.45 Mil/uL (ref 3.87–5.11)
WBC: 6.3 10*3/uL (ref 4.5–10.5)

## 2011-07-02 LAB — LIPID PANEL
Cholesterol: 166 mg/dL (ref 0–200)
LDL Cholesterol: 100 mg/dL — ABNORMAL HIGH (ref 0–99)
Total CHOL/HDL Ratio: 3
Triglycerides: 49 mg/dL (ref 0.0–149.0)
VLDL: 9.8 mg/dL (ref 0.0–40.0)

## 2011-07-02 LAB — COMPREHENSIVE METABOLIC PANEL
ALT: 30 U/L (ref 0–35)
CO2: 29 mEq/L (ref 19–32)
Chloride: 103 mEq/L (ref 96–112)
GFR: 76.79 mL/min (ref >60.00)
Sodium: 141 mEq/L (ref 135–145)
Total Bilirubin: 0.3 mg/dL (ref 0.3–1.2)
Total Protein: 7.3 g/dL (ref 6.0–8.3)

## 2011-07-02 LAB — TSH: TSH: 2.04 u[IU]/mL (ref 0.35–5.50)

## 2011-07-02 NOTE — Telephone Encounter (Signed)
Pt came into the lab today but there are no orders in EPIC. Please advise on lab test, thanks!

## 2011-07-03 NOTE — Telephone Encounter (Signed)
Orders were entered Thx

## 2011-07-05 LAB — URINALYSIS, ROUTINE W REFLEX MICROSCOPIC
Hgb urine dipstick: NEGATIVE
Ketones, ur: NEGATIVE
Leukocytes, UA: NEGATIVE
Nitrite: NEGATIVE
Urobilinogen, UA: 0.2 (ref 0.0–1.0)

## 2011-07-06 ENCOUNTER — Encounter: Payer: Self-pay | Admitting: Internal Medicine

## 2011-07-06 ENCOUNTER — Ambulatory Visit (INDEPENDENT_AMBULATORY_CARE_PROVIDER_SITE_OTHER): Payer: BC Managed Care – PPO | Admitting: Internal Medicine

## 2011-07-06 VITALS — BP 110/82 | HR 84 | Temp 98.0°F | Resp 16 | Ht 62.0 in

## 2011-07-06 DIAGNOSIS — R945 Abnormal results of liver function studies: Secondary | ICD-10-CM

## 2011-07-06 DIAGNOSIS — F329 Major depressive disorder, single episode, unspecified: Secondary | ICD-10-CM

## 2011-07-06 DIAGNOSIS — M25519 Pain in unspecified shoulder: Secondary | ICD-10-CM

## 2011-07-06 DIAGNOSIS — M25511 Pain in right shoulder: Secondary | ICD-10-CM | POA: Insufficient documentation

## 2011-07-06 DIAGNOSIS — E785 Hyperlipidemia, unspecified: Secondary | ICD-10-CM

## 2011-07-06 DIAGNOSIS — H6120 Impacted cerumen, unspecified ear: Secondary | ICD-10-CM

## 2011-07-06 MED ORDER — VITAMIN D 1000 UNITS PO TABS: 1000.0000 [IU] | ORAL_TABLET | Freq: Every day | ORAL | Status: AC

## 2011-07-06 MED ORDER — DICLOFENAC SODIUM 1 % TD GEL: 1.0000 "application " | Freq: Four times a day (QID) | TRANSDERMAL | Status: DC

## 2011-07-06 MED ORDER — CITALOPRAM HYDROBROMIDE 40 MG PO TABS: 40.0000 mg | ORAL_TABLET | Freq: Every day | ORAL | Status: DC

## 2011-07-06 MED ORDER — ATORVASTATIN CALCIUM 40 MG PO TABS: 40.0000 mg | ORAL_TABLET | Freq: Every day | ORAL | Status: DC

## 2011-07-06 NOTE — Progress Notes (Signed)
  Subjective:    Patient ID: Evelyn Murphy, female    DOB: October 11, 1945, 66 y.o.   MRN: [id number]  HPI  C/o R shoulder pain x weeks - she did have an ortho appt and an injection.  C/o R earache F/u anxiety, insomnia  Review of Systems  Constitutional: Negative for chills, activity change, appetite change, fatigue and unexpected weight change.  HENT: Negative for congestion, mouth sores and sinus pressure.   Eyes: Negative for visual disturbance.  Respiratory: Negative for cough and chest tightness.   Gastrointestinal: Negative for nausea and abdominal pain.  Genitourinary: Negative for frequency, difficulty urinating and vaginal pain.  Musculoskeletal: Positive for arthralgias. Negative for back pain and gait problem.  Skin: Negative for pallor and rash.  Neurological: Negative for dizziness, tremors, weakness, numbness and headaches.  Psychiatric/Behavioral: Positive for sleep disturbance. Negative for suicidal ideas and confusion. The patient is not nervous/anxious.         Objective:   Physical Exam  Constitutional: She appears well-developed. No distress.  HENT:  Head: Normocephalic.  Right Ear: External ear normal.  Left Ear: External ear normal.  Nose: Nose normal.  Mouth/Throat: Oropharynx is clear and moist.  Eyes: Conjunctivae are normal. Pupils are equal, round, and reactive to light. Right eye exhibits no discharge. Left eye exhibits no discharge.  Neck: Normal range of motion. Neck supple. No JVD present. No tracheal deviation present. No thyromegaly present.  Cardiovascular: Normal rate, regular rhythm and normal heart sounds.   Pulmonary/Chest: No stridor. No respiratory distress. She has no wheezes.  Abdominal: Soft. Bowel sounds are normal. She exhibits no distension and no mass. There is no tenderness. There is no rebound and no guarding.  Musculoskeletal: She exhibits tenderness (R and L traps are tender; R>L). She exhibits no edema.  Lymphadenopathy:    She  has no cervical adenopathy.  Neurological: She displays normal reflexes. No cranial nerve deficit. She exhibits normal muscle tone. Coordination normal.  Skin: No rash noted. No erythema.  Psychiatric: She has a normal mood and affect. Her behavior is normal. Judgment and thought content normal.    Lab Results  Component Value Date   WBC 6.3 07/02/2011   HGB 12.9 07/02/2011   HCT 39.4 07/02/2011   PLT 282.0 07/02/2011   GLUCOSE 98 07/02/2011   CHOL 166 07/02/2011   TRIG 49.0 07/02/2011   HDL 55.90 07/02/2011   LDLDIRECT 123.9 10/06/2009   LDLCALC 100* 07/02/2011   ALT 30 07/02/2011   AST 28 07/02/2011   NA 141 07/02/2011   K 4.7 07/02/2011   CL 103 07/02/2011   CREATININE 0.8 07/02/2011   BUN 26* 07/02/2011   CO2 29 07/02/2011   TSH 2.04 07/02/2011      Procedure Note :     Procedure :  Ear irrigation   Indication:  Cerumen impaction R>L   Risks, including pain, dizziness, eardrum perforation, bleeding, infection and others as well as benefits were explained to the patient in detail. Verbal consent was obtained and the patient agreed to proceed.    We used "The [employer] Device" field with lukewarm water for irrigation. A large amount wax was recovered. Procedure has also required manual wax removal with an ear loop.   Tolerated well. Complications: None.   Postprocedure instructions :  Call if problems.      Assessment & Plan:

## 2011-07-06 NOTE — Assessment & Plan Note (Signed)
resolved 

## 2011-07-06 NOTE — Assessment & Plan Note (Signed)
4/13 B R>L Irrigated

## 2011-07-06 NOTE — Assessment & Plan Note (Signed)
Continue with current prescription therapy as reflected on the Med list.  

## 2011-07-06 NOTE — Assessment & Plan Note (Signed)
Continue with current prescription therapy as reflected on the Med list. Hold lipitor if myalgias

## 2011-07-06 NOTE — Assessment & Plan Note (Signed)
4/13 s/p ortho eval/inj Voltaren gel F/u w/ortho

## 2011-08-05 ENCOUNTER — Telehealth: Payer: Self-pay

## 2011-08-05 NOTE — Telephone Encounter (Signed)
Form faxed

## 2011-08-05 NOTE — Telephone Encounter (Signed)
Pt called requesting to know the status of Voltaren gel PA. Pa was received this week and is currently being processed by MD to fax back. I made several attempts to call pt to inform her but she is unable to hear me. I will continue attempting to contact her.

## 2011-08-06 NOTE — Telephone Encounter (Signed)
Approval received per Misty Stanley, CMA. Pharmacy notified of same.

## 2011-09-08 ENCOUNTER — Other Ambulatory Visit: Payer: Self-pay | Admitting: Sports Medicine

## 2011-09-08 DIAGNOSIS — M25519 Pain in unspecified shoulder: Secondary | ICD-10-CM

## 2011-09-14 ENCOUNTER — Ambulatory Visit
Admission: RE | Admit: 2011-09-14 | Discharge: 2011-09-14 | Disposition: A | Discharge status: Home / Self Care | Payer: BC Managed Care – PPO | Source: Ambulatory Visit | Attending: Sports Medicine | Admitting: Sports Medicine

## 2011-09-14 DIAGNOSIS — M25519 Pain in unspecified shoulder: Secondary | ICD-10-CM

## 2011-10-21 HISTORY — PX: SHOULDER ARTHROSCOPY: SHX128

## 2011-12-20 ENCOUNTER — Telehealth: Payer: Self-pay | Admitting: *Deleted

## 2011-12-20 NOTE — Telephone Encounter (Signed)
OK to fill this prescription with additional refills x5 Thank you!  

## 2011-12-20 NOTE — Telephone Encounter (Signed)
Rf req for Zolpidem 10 mg. Last filled 11/19/2011. Ok to Rf?

## 2011-12-21 MED ORDER — ZOLPIDEM TARTRATE 10 MG PO TABS: 10.0000 mg | ORAL_TABLET | Freq: Every evening | ORAL | Status: DC | PRN

## 2011-12-21 NOTE — Telephone Encounter (Signed)
Done

## 2011-12-24 ENCOUNTER — Other Ambulatory Visit: Payer: Self-pay | Admitting: *Deleted

## 2011-12-24 MED ORDER — ZOLPIDEM TARTRATE 10 MG PO TABS: 10.0000 mg | ORAL_TABLET | Freq: Every evening | ORAL | Status: DC | PRN

## 2012-01-06 ENCOUNTER — Other Ambulatory Visit: Payer: Self-pay | Admitting: *Deleted

## 2012-01-06 ENCOUNTER — Other Ambulatory Visit (INDEPENDENT_AMBULATORY_CARE_PROVIDER_SITE_OTHER): Payer: BC Managed Care – PPO

## 2012-01-06 DIAGNOSIS — E785 Hyperlipidemia, unspecified: Secondary | ICD-10-CM

## 2012-01-06 DIAGNOSIS — R945 Abnormal results of liver function studies: Secondary | ICD-10-CM

## 2012-01-06 LAB — CBC WITH DIFFERENTIAL/PLATELET
Basophils Relative: 0.6 % (ref 0.0–3.0)
Eosinophils Relative: 2.9 % (ref 0.0–5.0)
Lymphocytes Relative: 35.4 % (ref 12.0–46.0)
Neutrophils Relative %: 53.2 % (ref 43.0–77.0)
RBC: 4.15 Mil/uL (ref 3.87–5.11)
WBC: 4.3 10*3/uL — ABNORMAL LOW (ref 4.5–10.5)

## 2012-01-06 LAB — COMPREHENSIVE METABOLIC PANEL
Albumin: 3.9 g/dL (ref 3.5–5.2)
BUN: 23 mg/dL (ref 6–23)
CO2: 28 mEq/L (ref 19–32)
Calcium: 9.7 mg/dL (ref 8.4–10.5)
Chloride: 106 mEq/L (ref 96–112)
Glucose, Bld: 110 mg/dL — ABNORMAL HIGH (ref 70–99)
Potassium: 3.9 mEq/L (ref 3.5–5.1)

## 2012-01-06 LAB — LIPID PANEL
Cholesterol: 191 mg/dL (ref 0–200)
Total CHOL/HDL Ratio: 3
Triglycerides: 78 mg/dL (ref 0.0–149.0)

## 2012-01-11 ENCOUNTER — Encounter: Payer: Self-pay | Admitting: Internal Medicine

## 2012-01-11 ENCOUNTER — Ambulatory Visit (INDEPENDENT_AMBULATORY_CARE_PROVIDER_SITE_OTHER): Payer: BC Managed Care – PPO | Admitting: Internal Medicine

## 2012-01-11 VITALS — BP 110/74 | HR 76 | Temp 98.0°F | Resp 16 | Ht 62.5 in

## 2012-01-11 DIAGNOSIS — G4700 Insomnia, unspecified: Secondary | ICD-10-CM

## 2012-01-11 DIAGNOSIS — M25511 Pain in right shoulder: Secondary | ICD-10-CM

## 2012-01-11 DIAGNOSIS — R7989 Other specified abnormal findings of blood chemistry: Secondary | ICD-10-CM

## 2012-01-11 DIAGNOSIS — E785 Hyperlipidemia, unspecified: Secondary | ICD-10-CM

## 2012-01-11 DIAGNOSIS — M25519 Pain in unspecified shoulder: Secondary | ICD-10-CM

## 2012-01-11 MED ORDER — CITALOPRAM HYDROBROMIDE 40 MG PO TABS: 40.0000 mg | ORAL_TABLET | Freq: Every day | ORAL | Status: DC

## 2012-01-11 MED ORDER — NORTRIPTYLINE HCL 25 MG PO CAPS: 25.0000 mg | ORAL_CAPSULE | Freq: Every day | ORAL | Status: DC

## 2012-01-11 MED ORDER — DICLOFENAC SODIUM 1 % TD GEL: 4.0000 g | Freq: Four times a day (QID) | TRANSDERMAL | Status: DC

## 2012-01-11 MED ORDER — ATORVASTATIN CALCIUM 40 MG PO TABS: 40.0000 mg | ORAL_TABLET | Freq: Every day | ORAL | Status: DC

## 2012-01-11 NOTE — Assessment & Plan Note (Signed)
Mild and chronic ?etiol - repeat q 6 mo

## 2012-01-11 NOTE — Assessment & Plan Note (Signed)
Added Nortriptyline

## 2012-01-11 NOTE — Assessment & Plan Note (Signed)
Post-op pain is present Ibuprofen prn

## 2012-01-11 NOTE — Progress Notes (Signed)
  Subjective:    Patient ID: Evelyn Murphy, female    DOB: [birth year], 66 y.o.   MRN: [id number]  HPI  C/o R shoulder pain x weeks - she did have surgery 6 wks ago F/u anxiety, insomnia. Insomnia is worse  BP Readings from Last 3 Encounters:  01/11/12 110/74  07/06/11 110/82  06/08/10 102/74      Review of Systems  Constitutional: Negative for chills, activity change, appetite change, fatigue and unexpected weight change.  HENT: Negative for congestion, mouth sores and sinus pressure.   Eyes: Negative for visual disturbance.  Respiratory: Negative for cough and chest tightness.   Gastrointestinal: Negative for nausea and abdominal pain.  Genitourinary: Negative for frequency, difficulty urinating and vaginal pain.  Musculoskeletal: Positive for arthralgias. Negative for back pain and gait problem.  Skin: Negative for pallor and rash.  Neurological: Negative for dizziness, tremors, weakness, numbness and headaches.  Psychiatric/Behavioral: Positive for disturbed wake/sleep cycle. Negative for suicidal ideas and confusion. The patient is not nervous/anxious.         Objective:   Physical Exam  Constitutional: She appears well-developed. No distress.  HENT:  Head: Normocephalic.  Right Ear: External ear normal.  Left Ear: External ear normal.  Nose: Nose normal.  Mouth/Throat: Oropharynx is clear and moist.  Eyes: Conjunctivae normal are normal. Pupils are equal, round, and reactive to light. Right eye exhibits no discharge. Left eye exhibits no discharge.  Neck: Normal range of motion. Neck supple. No JVD present. No tracheal deviation present. No thyromegaly present.  Cardiovascular: Normal rate, regular rhythm and normal heart sounds.   Pulmonary/Chest: No stridor. No respiratory distress. She has no wheezes.  Abdominal: Soft. Bowel sounds are normal. She exhibits no distension and no mass. There is no tenderness. There is no rebound and no guarding.  Musculoskeletal: She  exhibits tenderness (R and L traps are tender; R>L). She exhibits no edema.  Lymphadenopathy:    She has no cervical adenopathy.  Neurological: She displays normal reflexes. No cranial nerve deficit. She exhibits normal muscle tone. Coordination normal.  Skin: No rash noted. No erythema.  Psychiatric: She has a normal mood and affect. Her behavior is normal. Judgment and thought content normal.    Lab Results  Component Value Date   WBC 4.3* 01/06/2012   HGB 11.9* 01/06/2012   HCT 36.0 01/06/2012   PLT 277.0 01/06/2012   GLUCOSE 110* 01/06/2012   CHOL 191 01/06/2012   TRIG 78.0 01/06/2012   HDL 63.00 01/06/2012   LDLDIRECT 123.9 10/06/2009   LDLCALC 112* 01/06/2012   ALT 33 01/06/2012   AST 27 01/06/2012   NA 140 01/06/2012   K 3.9 01/06/2012   CL 106 01/06/2012   CREATININE 0.7 01/06/2012   BUN 23 01/06/2012   CO2 28 01/06/2012   TSH 2.04 07/02/2011           Assessment & Plan:

## 2012-01-11 NOTE — Assessment & Plan Note (Signed)
Continue with current prescription therapy as reflected on the Med list.  

## 2012-01-17 ENCOUNTER — Other Ambulatory Visit: Payer: Self-pay | Admitting: Internal Medicine

## 2012-01-17 DIAGNOSIS — Z0000 Encounter for general adult medical examination without abnormal findings: Secondary | ICD-10-CM

## 2012-07-03 ENCOUNTER — Telehealth: Payer: Self-pay | Admitting: *Deleted

## 2012-07-03 NOTE — Telephone Encounter (Signed)
RF req for zolpidem 10 mg. Last filled 06/07/12. Ok to Rf?

## 2012-07-04 MED ORDER — ZOLPIDEM TARTRATE 10 MG PO TABS: 10.0000 mg | ORAL_TABLET | Freq: Every evening | ORAL | Status: DC | PRN

## 2012-07-04 NOTE — Telephone Encounter (Signed)
OK to fill this prescription with additional refills x5 Thank you!  

## 2012-07-04 NOTE — Telephone Encounter (Signed)
Done

## 2012-07-05 ENCOUNTER — Other Ambulatory Visit (INDEPENDENT_AMBULATORY_CARE_PROVIDER_SITE_OTHER): Payer: BC Managed Care – PPO

## 2012-07-05 DIAGNOSIS — Z0000 Encounter for general adult medical examination without abnormal findings: Secondary | ICD-10-CM

## 2012-07-05 LAB — URINALYSIS, ROUTINE W REFLEX MICROSCOPIC
Bilirubin Urine: NEGATIVE
Hgb urine dipstick: NEGATIVE
Total Protein, Urine: NEGATIVE
Urine Glucose: NEGATIVE

## 2012-07-05 LAB — BASIC METABOLIC PANEL
BUN: 20 mg/dL (ref 6–23)
CO2: 31 mEq/L (ref 19–32)
Chloride: 104 mEq/L (ref 96–112)
Creatinine, Ser: 0.7 mg/dL (ref 0.4–1.2)
Glucose, Bld: 97 mg/dL (ref 70–99)

## 2012-07-05 LAB — HEPATIC FUNCTION PANEL
Alkaline Phosphatase: 113 U/L (ref 39–117)
Bilirubin, Direct: 0.1 mg/dL (ref 0.0–0.3)
Total Bilirubin: 0.4 mg/dL (ref 0.3–1.2)
Total Protein: 7.6 g/dL (ref 6.0–8.3)

## 2012-07-05 LAB — TSH: TSH: 2 u[IU]/mL (ref 0.35–5.50)

## 2012-07-05 LAB — CBC WITH DIFFERENTIAL/PLATELET
Basophils Relative: 0.6 % (ref 0.0–3.0)
Eosinophils Absolute: 0.1 10*3/uL (ref 0.0–0.7)
Lymphocytes Relative: 34.3 % (ref 12.0–46.0)
MCHC: 33.5 g/dL (ref 30.0–36.0)
Neutrophils Relative %: 54.9 % (ref 43.0–77.0)
Platelets: 268 10*3/uL (ref 150.0–400.0)
RBC: 4.51 Mil/uL (ref 3.87–5.11)
WBC: 4.5 10*3/uL (ref 4.5–10.5)

## 2012-07-05 LAB — LIPID PANEL
HDL: 72.6 mg/dL (ref >39.00)
VLDL: 24.4 mg/dL (ref 0.0–40.0)

## 2012-07-10 ENCOUNTER — Ambulatory Visit (INDEPENDENT_AMBULATORY_CARE_PROVIDER_SITE_OTHER): Payer: BC Managed Care – PPO | Admitting: Internal Medicine

## 2012-07-10 ENCOUNTER — Encounter: Payer: Self-pay | Admitting: Internal Medicine

## 2012-07-10 VITALS — BP 110/72 | HR 80 | Temp 97.8°F | Resp 16 | Ht 63.0 in | Wt 140.0 lb

## 2012-07-10 DIAGNOSIS — R9431 Abnormal electrocardiogram [ECG] [EKG]: Secondary | ICD-10-CM | POA: Insufficient documentation

## 2012-07-10 DIAGNOSIS — M25511 Pain in right shoulder: Secondary | ICD-10-CM

## 2012-07-10 DIAGNOSIS — E785 Hyperlipidemia, unspecified: Secondary | ICD-10-CM

## 2012-07-10 DIAGNOSIS — H6123 Impacted cerumen, bilateral: Secondary | ICD-10-CM

## 2012-07-10 DIAGNOSIS — Z0000 Encounter for general adult medical examination without abnormal findings: Secondary | ICD-10-CM | POA: Insufficient documentation

## 2012-07-10 DIAGNOSIS — H6120 Impacted cerumen, unspecified ear: Secondary | ICD-10-CM

## 2012-07-10 DIAGNOSIS — G4700 Insomnia, unspecified: Secondary | ICD-10-CM

## 2012-07-10 DIAGNOSIS — M25519 Pain in unspecified shoulder: Secondary | ICD-10-CM

## 2012-07-10 DIAGNOSIS — F329 Major depressive disorder, single episode, unspecified: Secondary | ICD-10-CM

## 2012-07-10 MED ORDER — LUBIPROSTONE 24 MCG PO CAPS: 24.0000 ug | ORAL_CAPSULE | Freq: Two times a day (BID) | ORAL | Status: DC

## 2012-07-10 MED ORDER — NORTRIPTYLINE HCL 25 MG PO CAPS: 25.0000 mg | ORAL_CAPSULE | Freq: Every day | ORAL | Status: DC

## 2012-07-10 MED ORDER — CITALOPRAM HYDROBROMIDE 40 MG PO TABS: 40.0000 mg | ORAL_TABLET | Freq: Every day | ORAL | Status: DC

## 2012-07-10 MED ORDER — ZOLPIDEM TARTRATE 10 MG PO TABS: 10.0000 mg | ORAL_TABLET | Freq: Every evening | ORAL | Status: DC | PRN

## 2012-07-10 MED ORDER — VITAMIN D 1000 UNITS PO TABS: 1000.0000 [IU] | ORAL_TABLET | Freq: Every day | ORAL | Status: AC

## 2012-07-10 MED ORDER — ATORVASTATIN CALCIUM 40 MG PO TABS: 40.0000 mg | ORAL_TABLET | Freq: Every day | ORAL | Status: DC

## 2012-07-10 NOTE — Assessment & Plan Note (Signed)
Continue with current prescription therapy as reflected on the Med list.  

## 2012-07-10 NOTE — Assessment & Plan Note (Signed)
RTC for ear irrigation

## 2012-07-10 NOTE — Assessment & Plan Note (Signed)
Restart Amitriptyline 

## 2012-07-10 NOTE — Progress Notes (Signed)
Subjective:    Patient ID: Evelyn Murphy, female    DOB: April 28, 1945, 67 y.o.   MRN: [id number]  HPI  The patient is here for a wellness exam. The patient has been doing well overall without major physical or psychological issues going on lately. The patient needs to address  chronic hypertension that has been well controlled with medicines; to address chronic  hyperlipidemia controlled with medicines as well; and to address insomnia  Wt Readings from Last 3 Encounters:  07/10/12 140 lb (63.504 kg)   BP Readings from Last 3 Encounters:  07/10/12 110/72  01/11/12 110/74  07/06/11 110/82      Review of Systems  Constitutional: Negative for fever, chills, diaphoresis, activity change, appetite change, fatigue and unexpected weight change.  HENT: Negative for hearing loss, ear pain, congestion, sore throat, sneezing, mouth sores, neck pain, dental problem, voice change, postnasal drip and sinus pressure.   Eyes: Negative for pain and visual disturbance.  Respiratory: Negative for cough, chest tightness, wheezing and stridor.   Cardiovascular: Negative for chest pain, palpitations and leg swelling.  Gastrointestinal: Negative for nausea, vomiting, abdominal pain, blood in stool, abdominal distention and rectal pain.  Genitourinary: Negative for dysuria, hematuria, decreased urine volume, vaginal bleeding, vaginal discharge, difficulty urinating, vaginal pain and menstrual problem.  Musculoskeletal: Negative for back pain, joint swelling and gait problem.  Skin: Negative for color change, rash and wound.  Neurological: Negative for dizziness, tremors, syncope, speech difficulty and light-headedness.  Hematological: Negative for adenopathy.  Psychiatric/Behavioral: Positive for sleep disturbance. Negative for suicidal ideas, hallucinations, behavioral problems, confusion, dysphoric mood and decreased concentration. The patient is not hyperactive.        Objective:   Physical Exam   Constitutional: She appears well-developed. No distress.  Obese  HENT:  Head: Normocephalic.  Right Ear: External ear normal.  Left Ear: External ear normal.  Nose: Nose normal.  Mouth/Throat: Oropharynx is clear and moist.  Wax B  Eyes: Conjunctivae are normal. Pupils are equal, round, and reactive to light. Right eye exhibits no discharge. Left eye exhibits no discharge.  Neck: Normal range of motion. Neck supple. No JVD present. No tracheal deviation present. No thyromegaly present.  Cardiovascular: Normal rate, regular rhythm and normal heart sounds.   Pulmonary/Chest: No stridor. No respiratory distress. She has no wheezes.  Abdominal: Soft. Bowel sounds are normal. She exhibits no distension and no mass. There is no tenderness. There is no rebound and no guarding.  Musculoskeletal: She exhibits no edema and no tenderness.  Lymphadenopathy:    She has no cervical adenopathy.  Neurological: She displays normal reflexes. No cranial nerve deficit. She exhibits normal muscle tone. Coordination normal.  Skin: No rash noted. No erythema.  Psychiatric: She has a normal mood and affect. Her behavior is normal. Judgment and thought content normal.    Lab Results  Component Value Date   WBC 4.5 07/05/2012   HGB 13.1 07/05/2012   HCT 39.1 07/05/2012   PLT 268.0 07/05/2012   GLUCOSE 97 07/05/2012   CHOL 241* 07/05/2012   TRIG 122.0 07/05/2012   HDL 72.60 07/05/2012   LDLDIRECT 143.4 07/05/2012   LDLCALC 112* 01/06/2012   ALT 29 07/05/2012   AST 28 07/05/2012   NA 141 07/05/2012   K 4.1 07/05/2012   CL 104 07/05/2012   CREATININE 0.7 07/05/2012   BUN 20 07/05/2012   CO2 31 07/05/2012   TSH 2.00 07/05/2012         Assessment & Plan:

## 2012-07-10 NOTE — Assessment & Plan Note (Signed)
Better  

## 2012-07-10 NOTE — Assessment & Plan Note (Signed)
Card cons Dr Daleen Squibb

## 2012-07-10 NOTE — Assessment & Plan Note (Signed)
We discussed age appropriate health related issues, including available/recomended screening tests and vaccinations. We discussed a need for adhering to healthy diet and exercise. Labs/EKG were reviewed/ordered. All questions were answered.  Zostavax adviced

## 2012-07-18 ENCOUNTER — Encounter: Payer: BC Managed Care – PPO | Admitting: Internal Medicine

## 2012-07-20 ENCOUNTER — Telehealth: Payer: Self-pay | Admitting: Internal Medicine

## 2012-07-20 NOTE — Telephone Encounter (Signed)
Patient Information:  Caller Name: Saide  Phone: [phone]  Patient: Evelyn Murphy, Evelyn Murphy  Gender: Female  DOB: 1945-07-26  Age: 67 Years  PCP: Plotnikov, Alex (Adults only)  Office Follow Up:  Does the office need to follow up with this patient?:  Yes  Instructions For The Office: Pt needs an appt for tomorrow 07/21/12 for evaluation.  No appts seen in [facility].  Please call her to advise.    Symptoms  Reason For Call & Symptoms: Pt calling that she is having so wheezing early in the AM.  Started 07/18/12.   In the past she has had an Advair in the past  (10 years) and it helped.  She is having no SX now. Triaged Breathing Problems from [facility] and needs to be seen in 24 hrs for evaluation of early morning wheezing.  Reviewed Health History In EMR: Yes  Reviewed Medications In EMR: Yes  Reviewed Allergies In EMR: Yes  Reviewed Surgeries / Procedures: Yes  Date of Onset of Symptoms: 07/18/2012  Treatments Tried: Hot tea.  Treatments Tried Worked: No  Guideline(s) Used:  No Protocol Available - Information Only  Disposition Per Guideline:   Discuss with PCP and Callback by Nurse Today  Reason For Disposition Reached:   Nursing judgment  Advice Given: For any changes or worsening of SX call back.   Patient Will Follow Care Advice:  YES

## 2012-07-22 ENCOUNTER — Ambulatory Visit (INDEPENDENT_AMBULATORY_CARE_PROVIDER_SITE_OTHER): Payer: BC Managed Care – PPO | Admitting: Family Medicine

## 2012-07-22 ENCOUNTER — Encounter: Payer: Self-pay | Admitting: Family Medicine

## 2012-07-22 VITALS — BP 100/64 | Temp 97.8°F

## 2012-07-22 DIAGNOSIS — J45909 Unspecified asthma, uncomplicated: Secondary | ICD-10-CM

## 2012-07-22 MED ORDER — PREDNISONE 20 MG PO TABS: ORAL_TABLET | ORAL | Status: DC

## 2012-07-22 NOTE — Progress Notes (Signed)
  Subjective:    Patient ID: Evelyn Murphy, female    DOB: 03-17-1946, 66 y.o.   MRN: [id number]  HPI  Evelyn Murphy is a 67 year old female nonsmoker who comes in today with a five-day history of allergic rhinitis and shortness of breath and wheezing  Her wheezing seems to come and go. She's never had asthma before. She has had a history of allergic rhinitis for which she takes over-the-counter medications.  Review of Systems    review of systems otherwise negative Objective:   Physical Exam Well-developed well-nourished female no acute distress HEENT negative neck was supple no adenopathy lungs are clear except for symmetrical late mild expiratory wheezing       Assessment & Plan:  Allergic rhinitis with mild asthma plan prednisone burst and taper

## 2012-07-22 NOTE — Patient Instructions (Signed)
Prednisone 20 mg,,,,,,, 2 tabs x3 days or until you feel a lot better then begin to taper as outlined  Return when necessary

## 2012-08-02 ENCOUNTER — Other Ambulatory Visit: Payer: Self-pay | Admitting: Internal Medicine

## 2012-08-02 NOTE — Telephone Encounter (Signed)
The patient called and is hoping to get her medication for Amitiza changed to a different dose.  She states this medication is not working.    Pt's callback - [phone]

## 2012-08-03 NOTE — Telephone Encounter (Signed)
Amitiza was at max dose We can try Linzess instead Thx

## 2012-08-04 MED ORDER — LINACLOTIDE 290 MCG PO CAPS: 1.0000 | ORAL_CAPSULE | Freq: Every day | ORAL | Status: DC

## 2012-08-04 NOTE — Telephone Encounter (Signed)
Notified pt with md response. Will try Linzess inform pt will leave some samples if med work for her to give md call back & he will send rx into pharmacy...lmb

## 2012-08-18 ENCOUNTER — Institutional Professional Consult (permissible substitution): Payer: BC Managed Care – PPO | Admitting: Cardiovascular Disease

## 2012-09-05 ENCOUNTER — Encounter: Payer: Self-pay | Admitting: Internal Medicine

## 2012-09-11 ENCOUNTER — Encounter: Payer: Self-pay | Admitting: Cardiovascular Disease

## 2012-09-11 ENCOUNTER — Ambulatory Visit (INDEPENDENT_AMBULATORY_CARE_PROVIDER_SITE_OTHER): Payer: Medicare Other | Admitting: Cardiovascular Disease

## 2012-09-11 VITALS — BP 106/82 | HR 92 | Ht 63.0 in

## 2012-09-11 DIAGNOSIS — E785 Hyperlipidemia, unspecified: Secondary | ICD-10-CM | POA: Diagnosis not present

## 2012-09-11 NOTE — Assessment & Plan Note (Signed)
Evelyn Murphy presents today for a second opinion regarding risk factors for coronary artery disease. She has a history of hyperlipidemia. She has a very strong family history of coronary artery disease with multiple family members having stents, heart attacks, and bypass surgery.  Last LDL was 143.  We had a long discussion regarding her goal LDL. I suspect that her LDL should be between 70 and 100.  We discussed possibly changing her atorvastatin to Crestor. We also discussed moderating her diet. I suspect that she eats a bit more carbohydrates that she should. She exercises an hour and a half to 2 hours every day but despite that still is carrying  a little bit of extra weight.  I've given her some information on the [facility] low glycemic index diet.   She worked on a good diet exercise weight loss program. If her lipids are still elevated when I see her again in 6 months we'll consider changing her atorvastatin to Crestor.

## 2012-09-11 NOTE — Patient Instructions (Addendum)
  Your physician wants you to follow-up in: 6 months  You will receive a reminder letter in the mail two months in advance. If you don't receive a letter, please call our office to schedule the follow-up appointment. Your physician recommends that you return for a FASTING lipid profile: 1 week prior to visit  [facility] Low Glycemic Diet (Source: [facility], 2006) Low Glycemic Foods (20-49) (Decrease risk of developing heart disease) Breakfast Cereals: All-Bran All-Bran Fruit 'n Oats Fiber One Oatmeal (not instant) Oat bran Fruits and fruit juices: (Limit to 1-2 servings per day) Apples Apricots (fresh & dried) Blackberries Blueberries Cherries Cranberries Peaches Pears Plums Prunes Grapefruit Raspberries Strawberries Tangerine Apple juice Grapefruit juice Tomato juice Beans and legumes (fresh-cooked): Black-eyed peas Butter beans Chick peas Lentils  Green beans Lima beans Kidney beans Navy beans Pinto beans Snow peas Non-starchy vegetables: Asparagus, avocado, broccoli, cabbage, cauliflower, celery, cucumber, greens, lettuce, mushrooms, peppers, tomatoes, okra, onions, spinach, summer squash Grains: Barley Bulgur Rye Wild rice Nuts and oils : Almonds Peanuts Sunflower seeds Hazelnuts Pecans Walnuts Oils that are liquid at room temperature Dairy, fish, meat, soy, and eggs: Milk, skim Lowfat cheese Yogurt, lowfat, fruit sugar sweetened Lean red meat Fish  Skinless chicken & [place] Shellfish Egg whites (up to 3 daily) Soy products  Egg yolks (up to 7 or _____ per week) Moderate Glycemic Foods (50-69) Breakfast Cereals: Bran Buds Bran Chex Just Right Mini-Wheats  Special K Swiss muesli Fruits: Banana (under-ripe) Dates Figs Grapes Kiwi Mango Oranges Raisins Fruit Juices: Cranberry juice Orange juice Beans and legumes: Boston-type baked beans Canned pinto, kidney, or navy beans Green peas Vegetables: Beets Carrots  Sweet potato Yam Corn  on the cob Breads: Pita (pocket) bread Oat bran bread Pumpernickel bread Rye bread Wheat bread, high fiber  Grains: Cornmeal Rice, brown Rice, white Couscous Pasta: Macaroni Pizza, cheese Ravioli, meat filled Spaghetti, white  Nuts: Cashews Macadamia Snacks: Chocolate Ice cream, lowfat Muffin Popcorn High Glycemic Foods (70-100)  Breakfast Cereals: Cheerios Corn Chex Corn Flakes Cream of Wheat Grape Nuts Grape Nut Flakes Grits Nutri-Grain Puffed Rice Puffed Wheat Rice Chex Rice Krispies Shredded Wheat Team Total Fruits: Pineapple Watermelon Banana (over-ripe) Beverages: Sodas, sweet tea, pineapple juice Vegetables: Potato, baked, boiled, fried, mashed [place] fries Canned or frozen corn Parsnips Winter squash Breads: Most breads (white and whole grain) Bagels Bread sticks Bread stuffing Kaiser roll Dinner rolls Grains: Rice, instant Tapioca, with milk Candy and most cookies Snacks: Donuts Corn chips Jelly beans Pretzels Pastries

## 2012-09-11 NOTE — Progress Notes (Signed)
     Evelyn Murphy Date of Birth  [birth year]       [facility]    [place] [address]  [address] [place], [place]  [address]   [place], [place]  [address] [phone]     [phone]   Fax  [phone]    Fax [phone]  Problem List: 1. Strong family history of coronary artery disease 2. Hyperlipidemia  History of Present Illness:  Martika is a 67 yo with family hx of CAD ( father died at 80 of MI, mother had MI at 24, CABG x 2, brother has had multiple stents).  Both aunts diet of heart disease.  She is concerned about her risks.   She denies any CP or dyspnea.  She exercises every day - 45 minutes on treadmill, 40 minutes on stationary bike,.  She also adds weights 3-4 days a week.    She has eats a low fat diet,   Current Outpatient Prescriptions on File Prior to Visit  Medication Sig Dispense Refill  . atorvastatin (LIPITOR) 40 MG tablet Take 1 tablet (40 mg total) by mouth daily.  90 tablet  3  . cholecalciferol (VITAMIN D) 1000 UNITS tablet Take 1 tablet (1,000 Units total) by mouth daily.  100 tablet  3  . citalopram (CELEXA) 40 MG tablet Take 1 tablet (40 mg total) by mouth daily.  90 tablet  3  . Linaclotide 290 MCG CAPS Take 1 capsule by mouth daily.  14 capsule  0  . zolpidem (AMBIEN) 10 MG tablet Take 1 tablet (10 mg total) by mouth at bedtime as needed for sleep.  90 tablet  1   No current facility-administered medications on file prior to visit.    No Known Allergies  Past Medical History  Diagnosis Date  . Hyperlipidemia   . Depression   . Insomnia     Past Surgical History  Procedure Laterality Date  . Shoulder arthroscopy  10/2011    R Dr Tomasa Blase    History  Smoking status  . Never Smoker   Smokeless tobacco  . Not on file    History  Alcohol Use No    Family History  Problem Relation Age of Onset  . Heart disease Mother 39    CABG  . Diabetes Mother   . Heart disease Father   . COPD  Father   . Heart disease Brother   . Diabetes Brother     Reviw of Systems:  Reviewed in the HPI.  All other systems are negative.  Physical Exam: Blood pressure 106/82, pulse 92, height 5\' 3"  (1.6 m), weight 0 lb (0 kg). General: Well developed, well nourished, in no acute distress.  Head: Normocephalic, atraumatic, sclera non-icteric, mucus membranes are moist,   Neck: Supple. Carotids are 2 + without bruits. No JVD   Lungs: Clear   Heart: RR, normla S1, S2  Abdomen: Soft, non-tender, non-distended with normal bowel sounds.  Msk:  Strength and tone are normal   Extremities: No clubbing or cyanosis. No edema.  Distal pedal pulses are 2+ and equal    Neuro: CN II - XII intact.  Alert and oriented X 3.   Psych:  Normal   ECG: April 2014:  NSR, RBBB.    Assessment / Plan:

## 2012-10-24 DIAGNOSIS — L608 Other nail disorders: Secondary | ICD-10-CM | POA: Diagnosis not present

## 2012-12-27 DIAGNOSIS — Z23 Encounter for immunization: Secondary | ICD-10-CM | POA: Diagnosis not present

## 2013-01-15 ENCOUNTER — Ambulatory Visit: Payer: BC Managed Care – PPO | Admitting: Internal Medicine

## 2013-01-17 ENCOUNTER — Ambulatory Visit: Payer: Medicare Other | Admitting: Internal Medicine

## 2013-01-24 ENCOUNTER — Other Ambulatory Visit (INDEPENDENT_AMBULATORY_CARE_PROVIDER_SITE_OTHER): Payer: Medicare Other

## 2013-01-24 ENCOUNTER — Encounter: Payer: Self-pay | Admitting: Internal Medicine

## 2013-01-24 ENCOUNTER — Ambulatory Visit (INDEPENDENT_AMBULATORY_CARE_PROVIDER_SITE_OTHER): Payer: Medicare Other | Admitting: Internal Medicine

## 2013-01-24 VITALS — BP 106/80 | HR 80 | Temp 97.1°F | Resp 16

## 2013-01-24 DIAGNOSIS — H6120 Impacted cerumen, unspecified ear: Secondary | ICD-10-CM | POA: Diagnosis not present

## 2013-01-24 DIAGNOSIS — F3289 Other specified depressive episodes: Secondary | ICD-10-CM

## 2013-01-24 DIAGNOSIS — K219 Gastro-esophageal reflux disease without esophagitis: Secondary | ICD-10-CM

## 2013-01-24 DIAGNOSIS — F329 Major depressive disorder, single episode, unspecified: Secondary | ICD-10-CM | POA: Diagnosis not present

## 2013-01-24 DIAGNOSIS — E785 Hyperlipidemia, unspecified: Secondary | ICD-10-CM

## 2013-01-24 DIAGNOSIS — H6122 Impacted cerumen, left ear: Secondary | ICD-10-CM

## 2013-01-24 DIAGNOSIS — R1310 Dysphagia, unspecified: Secondary | ICD-10-CM

## 2013-01-24 DIAGNOSIS — Z23 Encounter for immunization: Secondary | ICD-10-CM

## 2013-01-24 LAB — HEPATIC FUNCTION PANEL
Albumin: 4.4 g/dL (ref 3.5–5.2)
Alkaline Phosphatase: 102 U/L (ref 39–117)
Total Protein: 7.6 g/dL (ref 6.0–8.3)

## 2013-01-24 LAB — BASIC METABOLIC PANEL
Chloride: 100 mEq/L (ref 96–112)
GFR: 90.63 mL/min (ref >60.00)
Potassium: 3.9 mEq/L (ref 3.5–5.1)
Sodium: 137 mEq/L (ref 135–145)

## 2013-01-24 LAB — LIPID PANEL
Cholesterol: 231 mg/dL — ABNORMAL HIGH (ref 0–200)
HDL: 72.2 mg/dL (ref >39.00)
Total CHOL/HDL Ratio: 3
Triglycerides: 154 mg/dL — ABNORMAL HIGH (ref 0.0–149.0)
VLDL: 30.8 mg/dL (ref 0.0–40.0)

## 2013-01-24 MED ORDER — PANTOPRAZOLE SODIUM 40 MG PO TBEC: 40.0000 mg | DELAYED_RELEASE_TABLET | Freq: Every day | ORAL | Status: DC

## 2013-01-24 MED ORDER — ZOLPIDEM TARTRATE 10 MG PO TABS: 10.0000 mg | ORAL_TABLET | Freq: Every evening | ORAL | Status: DC | PRN

## 2013-01-24 MED ORDER — CITALOPRAM HYDROBROMIDE 40 MG PO TABS: 40.0000 mg | ORAL_TABLET | Freq: Every day | ORAL | Status: DC

## 2013-01-24 MED ORDER — NORTRIPTYLINE HCL 25 MG PO CAPS: 25.0000 mg | ORAL_CAPSULE | ORAL | Status: DC | PRN

## 2013-01-24 MED ORDER — ATORVASTATIN CALCIUM 40 MG PO TABS: 40.0000 mg | ORAL_TABLET | Freq: Every day | ORAL | Status: DC

## 2013-01-24 NOTE — Assessment & Plan Note (Signed)
Continue with current prescription therapy as reflected on the Med list.  

## 2013-01-24 NOTE — Assessment & Plan Note (Signed)
Silent w/relapsing mild dysphagia ?stricture 11/14

## 2013-01-24 NOTE — Assessment & Plan Note (Signed)
?  2012 Silent GERD w/relapsing mild dysphagia ?stricture 11/14  Protonix qd x 2 mo GI ref if not well

## 2013-01-24 NOTE — Progress Notes (Signed)
Pre-visit discussion using our clinic review tool. No additional management support is needed unless otherwise documented below in the visit note.  

## 2013-01-24 NOTE — Progress Notes (Signed)
Subjective:    HPI   The patient needs to address  chronic hypertension that has been well controlled with medicines; to address chronic  hyperlipidemia controlled with medicines as well; and to address insomnia C/o rice and bread get stuck x wks (Has had it in the past) - PPI helped  Wt Readings from Last 3 Encounters:  07/10/12 140 lb (63.504 kg)   BP Readings from Last 3 Encounters:  01/24/13 106/80  09/11/12 106/82  07/22/12 100/64      Review of Systems  Constitutional: Negative for fever, chills, diaphoresis, activity change, appetite change, fatigue and unexpected weight change.  HENT: Negative for congestion, dental problem, ear pain, hearing loss, mouth sores, postnasal drip, sinus pressure, sneezing, sore throat and voice change.   Eyes: Negative for pain and visual disturbance.  Respiratory: Negative for cough, chest tightness, wheezing and stridor.   Cardiovascular: Negative for chest pain, palpitations and leg swelling.  Gastrointestinal: Negative for nausea, vomiting, abdominal pain, blood in stool, abdominal distention and rectal pain.  Genitourinary: Negative for dysuria, hematuria, decreased urine volume, vaginal bleeding, vaginal discharge, difficulty urinating, vaginal pain and menstrual problem.  Musculoskeletal: Negative for back pain, gait problem, joint swelling and neck pain.  Skin: Negative for color change, rash and wound.  Neurological: Negative for dizziness, tremors, syncope, speech difficulty and light-headedness.  Hematological: Negative for adenopathy.  Psychiatric/Behavioral: Positive for sleep disturbance. Negative for suicidal ideas, hallucinations, behavioral problems, confusion, dysphoric mood and decreased concentration. The patient is not hyperactive.        Objective:   Physical Exam  Constitutional: She appears well-developed. No distress.  Obese  HENT:  Head: Normocephalic.  Right Ear: External ear normal.  Left Ear: External  ear normal.  Nose: Nose normal.  Mouth/Throat: Oropharynx is clear and moist.  Wax B  Eyes: Conjunctivae are normal. Pupils are equal, round, and reactive to light. Right eye exhibits no discharge. Left eye exhibits no discharge.  Neck: Normal range of motion. Neck supple. No JVD present. No tracheal deviation present. No thyromegaly present.  Cardiovascular: Normal rate, regular rhythm and normal heart sounds.   Pulmonary/Chest: No stridor. No respiratory distress. She has no wheezes.  Abdominal: Soft. Bowel sounds are normal. She exhibits no distension and no mass. There is no tenderness. There is no rebound and no guarding.  Musculoskeletal: She exhibits no edema and no tenderness.  Lymphadenopathy:    She has no cervical adenopathy.  Neurological: She displays normal reflexes. No cranial nerve deficit. She exhibits normal muscle tone. Coordination normal.  Skin: No rash noted. No erythema.  Psychiatric: She has a normal mood and affect. Her behavior is normal. Judgment and thought content normal.  Wax L  Lab Results  Component Value Date   WBC 4.5 07/05/2012   HGB 13.1 07/05/2012   HCT 39.1 07/05/2012   PLT 268.0 07/05/2012   GLUCOSE 97 07/05/2012   CHOL 241* 07/05/2012   TRIG 122.0 07/05/2012   HDL 72.60 07/05/2012   LDLDIRECT 143.4 07/05/2012   LDLCALC 112* 01/06/2012   ALT 29 07/05/2012   AST 28 07/05/2012   NA 141 07/05/2012   K 4.1 07/05/2012   CL 104 07/05/2012   CREATININE 0.7 07/05/2012   BUN 20 07/05/2012   CO2 31 07/05/2012   TSH 2.00 07/05/2012     Procedure Note :     Procedure :  Ear irrigation   Indication:  Cerumen impaction L   Risks, including pain, dizziness, eardrum perforation, bleeding, infection and  others as well as benefits were explained to the patient in detail. Verbal consent was obtained and the patient agreed to proceed.    We used "The Elephant Ear Irrigation Device" filled with lukewarm water for irrigation. A large amount wax was recovered. Procedure  has also required manual wax removal with an ear loop.   Tolerated well. Complications: None.   Postprocedure instructions :  Call if problems.       Assessment & Plan:

## 2013-01-24 NOTE — Assessment & Plan Note (Signed)
Will irrigate 

## 2013-01-26 ENCOUNTER — Telehealth: Payer: Self-pay | Admitting: *Deleted

## 2013-01-26 DIAGNOSIS — E785 Hyperlipidemia, unspecified: Secondary | ICD-10-CM

## 2013-01-26 NOTE — Telephone Encounter (Signed)
Will discuss labs next week with Dr Elease Hashimoto.

## 2013-01-26 NOTE — Telephone Encounter (Signed)
I would stay on 40 mg/d of Lipitor Thx

## 2013-01-26 NOTE — Telephone Encounter (Signed)
Message copied by Antony Odea on Fri Jan 26, 2013 12:55 PM ------      Message from: Vesta Mixer      Created: Fri Jan 26, 2013 12:03 PM       Labs are ok . Chol and trigs are a bit high.  Work on diet and exercise       ------

## 2013-01-26 NOTE — Telephone Encounter (Signed)
Message copied by Antony Odea on Fri Jan 26, 2013 12:44 PM ------      Message from: Vesta Mixer      Created: Fri Jan 26, 2013 12:03 PM       Labs are ok . Chol and trigs are a bit high.  Work on diet and exercise       ------

## 2013-01-26 NOTE — Telephone Encounter (Signed)
Pt wants PCP to review her labs 01/24/13 and advise as to whether you think she needs any med changes. She states Dr. Elease Hashimoto suggested increasing Lipitor to 80 mg/day. She trusts both MDs, but wants PCP's opinion, due to her elevated liver enzymes. Please advise

## 2013-01-29 NOTE — Telephone Encounter (Signed)
PT WILL CONTINUE CURRENT DOSE/ 3 MONTH LAB DATE TO CHECK LIVER AGAIN/ PT WILL TRY BETTER DIET AND EXERCISE ALONG WITH CURRENT DOSE.

## 2013-01-29 NOTE — Telephone Encounter (Signed)
Left mess for patient to call back.  

## 2013-01-30 NOTE — Telephone Encounter (Signed)
February sounds good for labs Thx

## 2013-01-30 NOTE — Telephone Encounter (Signed)
Pt informed --she wants to know what labs she needs to do. She states Dr. Elease Hashimoto ordered labs for February. She wants PCP to speak to Dr. Elease Hashimoto and advise her on when and where she needs to get her labs done. Please advise

## 2013-02-01 NOTE — Telephone Encounter (Signed)
Pt.notified

## 2013-04-24 ENCOUNTER — Other Ambulatory Visit: Payer: BC Managed Care – PPO

## 2013-05-01 ENCOUNTER — Other Ambulatory Visit (INDEPENDENT_AMBULATORY_CARE_PROVIDER_SITE_OTHER): Payer: Medicare Other

## 2013-05-01 ENCOUNTER — Telehealth: Payer: Self-pay

## 2013-05-01 DIAGNOSIS — R945 Abnormal results of liver function studies: Secondary | ICD-10-CM

## 2013-05-01 DIAGNOSIS — L608 Other nail disorders: Secondary | ICD-10-CM | POA: Diagnosis not present

## 2013-05-01 LAB — HEPATIC FUNCTION PANEL
ALBUMIN: 4.1 g/dL (ref 3.5–5.2)
ALK PHOS: 109 U/L (ref 39–117)
ALT: 83 U/L — ABNORMAL HIGH (ref 0–35)
AST: 76 U/L — ABNORMAL HIGH (ref 0–37)
Bilirubin, Direct: 0 mg/dL (ref 0.0–0.3)
Total Bilirubin: 0.5 mg/dL (ref 0.3–1.2)
Total Protein: 7.2 g/dL (ref 6.0–8.3)

## 2013-05-01 NOTE — Telephone Encounter (Signed)
Received call from [facility] patient there no order in system.After reviewing chart hepatic panel order entered.

## 2013-05-03 ENCOUNTER — Other Ambulatory Visit: Payer: Self-pay | Admitting: *Deleted

## 2013-05-03 ENCOUNTER — Telehealth: Payer: Self-pay | Admitting: Cardiovascular Disease

## 2013-05-03 DIAGNOSIS — R748 Abnormal levels of other serum enzymes: Secondary | ICD-10-CM

## 2013-05-03 DIAGNOSIS — E7800 Pure hypercholesterolemia, unspecified: Secondary | ICD-10-CM

## 2013-05-03 DIAGNOSIS — E782 Mixed hyperlipidemia: Secondary | ICD-10-CM

## 2013-05-03 NOTE — Telephone Encounter (Signed)
Left pt a message to call back. 

## 2013-05-03 NOTE — Telephone Encounter (Signed)
New message         Pt is returning nurses call

## 2013-05-03 NOTE — Telephone Encounter (Signed)
Patient aware of elevated liver enzymes, need to see lipid clinic,and order to stop Lipitor per Dr.[name]. Referral sent to [facility] for lipid clinic.

## 2013-05-03 NOTE — Telephone Encounter (Signed)
Follow Up ° °Pt returned call//  °

## 2013-05-09 ENCOUNTER — Ambulatory Visit: Payer: Medicare Other | Admitting: Pharmacist

## 2013-05-16 ENCOUNTER — Ambulatory Visit (INDEPENDENT_AMBULATORY_CARE_PROVIDER_SITE_OTHER): Payer: Medicare Other | Admitting: Pharmacist

## 2013-05-16 DIAGNOSIS — Z79899 Other long term (current) drug therapy: Secondary | ICD-10-CM | POA: Diagnosis not present

## 2013-05-16 DIAGNOSIS — E785 Hyperlipidemia, unspecified: Secondary | ICD-10-CM | POA: Diagnosis not present

## 2013-05-16 LAB — HEPATIC FUNCTION PANEL
ALK PHOS: 106 U/L (ref 39–117)
ALT: 36 U/L — ABNORMAL HIGH (ref 0–35)
AST: 26 U/L (ref 0–37)
Albumin: 4.3 g/dL (ref 3.5–5.2)
Bilirubin, Direct: 0 mg/dL (ref 0.0–0.3)
Total Bilirubin: 0.4 mg/dL (ref 0.3–1.2)
Total Protein: 7.7 g/dL (ref 6.0–8.3)

## 2013-05-16 MED ORDER — ROSUVASTATIN CALCIUM 10 MG PO TABS: 10.0000 mg | ORAL_TABLET | Freq: Every day | ORAL | Status: DC

## 2013-05-16 NOTE — Patient Instructions (Signed)
1.  Get liver enzymes retested today. 2.  Start eating breakfast daily.  Prefer high fiber cereal or cereal bar + fruit.  Metamucil is also an option to increase fiber - 1 tablespoon in 8 ounces of water/juice. 3.  Start Crestor 10 mg once daily. 4.  In six weeks will check advanced lipid test and liver (06/25/13 fasting)  3 days later review with Ysidro Evert (06/28/13)

## 2013-05-16 NOTE — Progress Notes (Signed)
Patient is a pleasant 68 y.o. WF referred to lipid clinic by Dr. Acie Fredrickson after LFTs trended up while she was taking atorvastatin 40 mg qd.  She stopped atorvastatin 2 weeks ago per Dr. Elmarie Shiley recommendation, and now following up with me. Her labs in 04/2013 showed ALT 83, AST 76 on statin. Patient has been taking atorvastatin for years, but LFTs only started trending up over past few months.  Patient doesn't recall having an abdominal U/S, and I don't see one in her medical records.  She does carry her weight in her abdominal region, and given LFTs only slightly elevated, they may be elevated due to fatty liver.  She has very strong family history of early heart disease, therefore a compelling reason to be on daily statin therapy.  Her LDL was last 135 mg/dL on lipitor 40 mg qd.  She doesn't recall taking other lipid lowering medications in the past.  Risk Factors:  Family h/o CAD, age - LDL goal < 100 preferably given strong family history; non-HDL goal < 130 Meds:  Off lipitor 40 mg qd x 2 weeks due to LFTs trending up Intolerant:  None  Family history:  Father died at 29 y.o. With MI, Mother had MI and CABG on two occasions (13 and 51 y.o.), brother has multiple stents, aunt has h/o CVA. Social history:  Denies alcohol use.  Denies tobacco use. Diet:  Skips breakfast typically, and often skips lunch as well.  She "snacks" most days of the week on salty/sugary foods such as potato chips or cookies.  Dinner is typically cooked at home, but she goes out a few times per week.  Denies fried foods or soda.  Eats red meat 1-2 times per week.  Rarely eats fish.  She is not opposed to eating breakfast, just doesn't do it.  She won't eat oatmeal.  She wants to try and cut out snack food as well. Exercise:  Great with this.  Works out 60 minutes daily on treadmill, then another 20 minutes on exercise bike.  She gets her 10,000 steps each day before she leaves the gym.  Goes to gym 6 days per week.  Labs:   Today  (05/16/13):  ALT 36 ,   AST 26 (off lipitor x 2 weeks) Early 05/03/2013:  ALT 83, AST 76 (on lipitor 40 mg qd) 01/2013:  TC 231, TG 154, HDL 72, LDL 135, ALT 51, AST 39 (on lipitor 40 mg qd)  Current Outpatient Prescriptions  Medication Sig Dispense Refill  . cholecalciferol (VITAMIN D) 1000 UNITS tablet Take 1 tablet (1,000 Units total) by mouth daily.  100 tablet  3  . citalopram (CELEXA) 40 MG tablet Take 1 tablet (40 mg total) by mouth daily.  90 tablet  3  . Linaclotide 290 MCG CAPS Take 1 capsule by mouth daily.  14 capsule  0  . nortriptyline (PAMELOR) 25 MG capsule Take 1-2 capsules (25-50 mg total) by mouth as needed.  180 capsule  3  . pantoprazole (PROTONIX) 40 MG tablet Take 1 tablet (40 mg total) by mouth daily.  30 tablet  3  . zolpidem (AMBIEN) 10 MG tablet Take 1 tablet (10 mg total) by mouth at bedtime as needed for sleep.  90 tablet  1   No current facility-administered medications for this visit.   No Known Allergies  Family History  Problem Relation Age of Onset  . Heart disease Mother 32    CABG  . Diabetes Mother   . Heart  disease Father   . COPD Father   . Heart disease Brother   . Diabetes Brother

## 2013-05-16 NOTE — Assessment & Plan Note (Addendum)
Given LFTs improved off atorvastatin, will restart statin today, but will use a different agent.  Given elevated LDL on lipitor, will start with Crestor 10 mg qd.  Given very strong family h/o CAD at young age, will check an NMR in 6 weeks and see if LDL-P is at her goal of < 1000 nmol/L (at least < 1300).  She is agreeable to dietary changes.  Exercise is great.  Hopefully with weight loss her LFTs will continue to improve.  1.  Got liver enzymes retested today - LFTs improved off lipitor x 2 weeks.  Patient notified. 2.  Start eating breakfast daily.  Prefer high fiber cereal or cereal bar + fruit.  Metamucil is also an option to increase fiber - 1 tablespoon in 8 ounces of water/juice. 3.  Significantly reduce her snacking with salty/sugary foods.  She agrees to reduce this or find healthier alternative. 4.  Start Crestor 10 mg once daily. 5.  In six weeks will check advanced lipid test and liver (06/25/13 fasting)  3 days later review with Ysidro Evert (06/28/13)

## 2013-05-21 DIAGNOSIS — N390 Urinary tract infection, site not specified: Secondary | ICD-10-CM | POA: Diagnosis not present

## 2013-05-21 DIAGNOSIS — Z130 Encounter for screening for diseases of the blood and blood-forming organs and certain disorders involving the immune mechanism: Secondary | ICD-10-CM | POA: Diagnosis not present

## 2013-05-21 DIAGNOSIS — Z124 Encounter for screening for malignant neoplasm of cervix: Secondary | ICD-10-CM | POA: Diagnosis not present

## 2013-05-21 DIAGNOSIS — N760 Acute vaginitis: Secondary | ICD-10-CM | POA: Diagnosis not present

## 2013-05-21 DIAGNOSIS — Z1231 Encounter for screening mammogram for malignant neoplasm of breast: Secondary | ICD-10-CM | POA: Diagnosis not present

## 2013-06-11 DIAGNOSIS — R1032 Left lower quadrant pain: Secondary | ICD-10-CM | POA: Diagnosis not present

## 2013-06-11 DIAGNOSIS — R319 Hematuria, unspecified: Secondary | ICD-10-CM | POA: Diagnosis not present

## 2013-06-11 DIAGNOSIS — K5900 Constipation, unspecified: Secondary | ICD-10-CM | POA: Diagnosis not present

## 2013-06-25 ENCOUNTER — Other Ambulatory Visit: Payer: Medicare Other

## 2013-06-25 ENCOUNTER — Other Ambulatory Visit (INDEPENDENT_AMBULATORY_CARE_PROVIDER_SITE_OTHER): Payer: Medicare Other

## 2013-06-25 DIAGNOSIS — E785 Hyperlipidemia, unspecified: Secondary | ICD-10-CM | POA: Diagnosis not present

## 2013-06-25 DIAGNOSIS — Z79899 Other long term (current) drug therapy: Secondary | ICD-10-CM

## 2013-06-25 LAB — HEPATIC FUNCTION PANEL
ALT: 33 U/L (ref 0–35)
AST: 34 U/L (ref 0–37)
Albumin: 4.3 g/dL (ref 3.5–5.2)
Alkaline Phosphatase: 94 U/L (ref 39–117)
BILIRUBIN DIRECT: 0 mg/dL (ref 0.0–0.3)
TOTAL PROTEIN: 7.7 g/dL (ref 6.0–8.3)
Total Bilirubin: 0.8 mg/dL (ref 0.3–1.2)

## 2013-06-26 LAB — NMR LIPOPROFILE WITH LIPIDS
CHOLESTEROL, TOTAL: 251 mg/dL — AB (ref <200)
HDL PARTICLE NUMBER: 53.6 umol/L (ref >=30.5)
HDL Size: 9.1 nm — ABNORMAL LOW (ref >=9.2)
HDL-C: 76 mg/dL (ref >=40)
LARGE HDL: 11.5 umol/L (ref >=4.8)
LARGE VLDL-P: 4 nmol/L — AB (ref <=2.7)
LDL (calc): 150 mg/dL — ABNORMAL HIGH (ref <100)
LDL Particle Number: 2053 nmol/L — ABNORMAL HIGH (ref <1000)
LDL Size: 20.7 nm (ref >20.5)
LP-IR Score: 60 — ABNORMAL HIGH (ref <=45)
Small LDL Particle Number: 976 nmol/L — ABNORMAL HIGH (ref <=527)
TRIGLYCERIDES: 127 mg/dL (ref <150)
VLDL Size: 58.7 nm — ABNORMAL HIGH (ref <=46.6)

## 2013-06-28 ENCOUNTER — Ambulatory Visit (INDEPENDENT_AMBULATORY_CARE_PROVIDER_SITE_OTHER): Payer: Medicare Other | Admitting: Pharmacist

## 2013-06-28 DIAGNOSIS — Z79899 Other long term (current) drug therapy: Secondary | ICD-10-CM | POA: Diagnosis not present

## 2013-06-28 DIAGNOSIS — E785 Hyperlipidemia, unspecified: Secondary | ICD-10-CM | POA: Diagnosis not present

## 2013-06-28 MED ORDER — ROSUVASTATIN CALCIUM 40 MG PO TABS: 40.0000 mg | ORAL_TABLET | Freq: Every day | ORAL | Status: DC

## 2013-06-28 NOTE — Assessment & Plan Note (Signed)
Given LDL-P number > 2000 nmol/L on Crestor 10 mg qd, and her strong family h/o premature CAD, will increase Crestor to 40 mg qd, and continue her current diet/exercise regimen.  Will recheck blood work in 3 months.  If LDL-P number remains well above 1300 nmol/L at that time, may consider combination therapy.  She has been working on weight loss.  She has not weighed herself, and wants to wait another month to do so.  She refused being weighed this morning.   Plan: 1.  Increase Crestor to 40 mg once daily.  If start getting muscle aches okay to start Co-Enzyme Q-10 at dose of 200 mg daily (over the counter).  If muscle aches still occur, reduce Crestor to 1/2 tablet daily (20 mg) 2.  Continue diet and exercise regimen. 3.  Recheck cholesterol 10/03/13 (anytime after 7:30 am - fasting), then see me 10/04/13 at 11:00 AM

## 2013-06-28 NOTE — Patient Instructions (Signed)
1.  Increase Crestor to 40 mg once daily.  If start getting muscle aches okay to start Co-Enzyme Q-10 at dose of 200 mg daily (over the counter).  If muscle aches still occur, reduce Crestor to 1/2 tablet daily (20 mg) 2.  Continue diet and exercise regimen. 3.  Recheck cholesterol 10/03/13 (anytime after 7:30 am - fasting), then see me 10/04/13 at 11:00 AM

## 2013-06-28 NOTE — Progress Notes (Signed)
Patient is a pleasant 68 y.o. WF referred to lipid clinic by Dr. Acie Fredrickson after LFTs trended up while she was taking atorvastatin 40 mg qd.  She stopped atorvastatin for 2 weeks, LFTs normalized, and then she was started on Crestor 10 mg qd. Her labs in 04/2013 showed ALT 83, AST 76 on statin, and LFTs were ALT 36, AST 26 off statin, and now ALT 33, AST 34 on Crestor 10 mg qd.  Liver enzymes are normal on daily Crestor and she is slowly losing weight as well.  Patient doesn't recall having an abdominal U/S, and I don't see one in her medical records.  She does carry her weight in her abdominal region, and given LFTs only slightly elevated in past, they may have been elevated due to fatty liver.  She has very strong family history of early heart disease, therefore a compelling reason to be on daily statin therapy.  Her LDL was last 135 mg/dL on lipitor 40 mg qd, and now LDL is 150 on Crestor 10 mg qd.  LDL-P number is 2000 nmol/L on Crestor 10 mg qd as well.  Risk Factors:  Family h/o CAD, age - LDL goal < 100 preferably given strong family history; non-HDL goal < 130;  LDL-P number goal at least < 1300, and prefer < 1000 if possible. Meds:  Crestor 10 mg qd Intolerant:  None, however LFTs slightly elevated on Lipitor 40 mg in the past.  Family history:  Father died at 44 y.o. With MI, Mother had MI and CABG on two occasions (94 and 57 y.o.), brother has multiple stents, aunt has h/o CVA. Social history:  Denies alcohol use.  Denies tobacco use. Diet:  Much improved.  She now drinks a fruit and kale smoothie every morning for breakfast, and a low fat option lunch.  Dinner is typically cooked at home, but she goes out a few times per week.  Denies fried foods or soda.  Eats red meat 1-2 times per week.  Rarely eats fish.  She has cut out all the snack food she was eating in the past (potato chips, cookies, etc). Exercise:  Great with this.  Works out 60 minutes daily on treadmill, then another 20 minutes on  exercise bike.  She gets her 10,000 steps each day before she leaves the gym.  Goes to gym 6-7 days per week.  Labs:   06/2013:  LDL-P number 2053, LDL 150, HDL 76, TG 127, TC 251, small LDL particle number [id number], LFTs normal (Crestor 10 mg qd) 05/16/13:  ALT 36 ,   AST 26 (off lipitor x 2 weeks) Early 05/03/2013:  ALT 83, AST 76 (on lipitor 40 mg qd) 01/2013:  TC 231, TG 154, HDL 72, LDL 135, ALT 51, AST 39 (on lipitor 40 mg qd)  Current Outpatient Prescriptions  Medication Sig Dispense Refill  . cholecalciferol (VITAMIN D) 1000 UNITS tablet Take 1 tablet (1,000 Units total) by mouth daily.  100 tablet  3  . citalopram (CELEXA) 40 MG tablet Take 1 tablet (40 mg total) by mouth daily.  90 tablet  3  . Linaclotide 290 MCG CAPS Take 1 capsule by mouth daily.  14 capsule  0  . nortriptyline (PAMELOR) 25 MG capsule Take 1-2 capsules (25-50 mg total) by mouth as needed.  180 capsule  3  . pantoprazole (PROTONIX) 40 MG tablet Take 1 tablet (40 mg total) by mouth daily.  30 tablet  3  . rosuvastatin (CRESTOR) 10 MG tablet Take  1 tablet (10 mg total) by mouth daily.  30 tablet  5  . zolpidem (AMBIEN) 10 MG tablet Take 1 tablet (10 mg total) by mouth at bedtime as needed for sleep.  90 tablet  1   No current facility-administered medications for this visit.   No Known Allergies  Family History  Problem Relation Age of Onset  . Heart disease Mother 83    CABG  . Diabetes Mother   . Heart disease Father   . COPD Father   . Heart disease Brother   . Diabetes Brother

## 2013-06-29 ENCOUNTER — Telehealth: Payer: Self-pay | Admitting: Cardiovascular Disease

## 2013-06-29 NOTE — Telephone Encounter (Signed)
New message     For Evelyn Murphy Pt want to talk to you regarding medication you put her on

## 2013-07-02 MED ORDER — EZETIMIBE-SIMVASTATIN 10-40 MG PO TABS: 1.0000 | ORAL_TABLET | Freq: Every day | ORAL | Status: DC

## 2013-07-02 NOTE — Telephone Encounter (Signed)
Follow up      Talk to Ysidro Evert Have questions regarding her medication you prescribed

## 2013-07-02 NOTE — Telephone Encounter (Signed)
Patient called to tell me that Crestor isn't covered by her insurance, and it will cost > $200 per month.  Was able to give her samples of Crestor 10 mg qd initially, and this got LDL-P down to 2,000 nmol/L.  Since she needed further reduction tried to increase to Crestor 40 mg qd.  Stopped lipitor in past due to elevated LFTs, which may have been due to fatty liver.  Risk Factors: Family h/o CAD, age - LDL goal < 100 preferably given strong family history; non-HDL goal < 130; LDL-P number goal at least < 1300, and prefer < 1000 if possible.   Plan: 1.  Will change Crestor 40 mg qd to Vytorin 10/40 mg qd.  She will call back if this costs too much and will consider simvastatin 40 mg alone or trying atorvastatin again. 2.  Patient agreeable to plan.  To Dr. Acie Fredrickson as FYI only.

## 2013-07-03 ENCOUNTER — Telehealth: Payer: Self-pay | Admitting: *Deleted

## 2013-07-03 NOTE — Telephone Encounter (Signed)
Follow Up  Pt states that her Insurance wouldn't cover Vytorin either. Requests a call back to discuss

## 2013-07-03 NOTE — Telephone Encounter (Signed)
PA done for VYTORIN for insurer [facility]

## 2013-07-04 ENCOUNTER — Other Ambulatory Visit: Payer: Self-pay

## 2013-07-04 MED ORDER — SIMVASTATIN 40 MG PO TABS: 40.0000 mg | ORAL_TABLET | Freq: Every day | ORAL | Status: DC

## 2013-07-04 NOTE — Telephone Encounter (Signed)
Vytorin and Crestor not covered by her insurance, and Lipitor appeared to elevate LFTs.  Will have patient start taking simvastatin 40 mg qhs, and in 3 months if LDL isn't adequately controlled, will have her change to Vytorin as we should be able to get a prior authorization done for it at this time. Patient agreeable to this.  Simvastatin 40 mg sent to pharmacy.

## 2013-07-05 ENCOUNTER — Other Ambulatory Visit: Payer: Self-pay

## 2013-07-05 MED ORDER — SIMVASTATIN 40 MG PO TABS: 40.0000 mg | ORAL_TABLET | Freq: Every day | ORAL | Status: DC

## 2013-07-24 ENCOUNTER — Ambulatory Visit: Payer: BC Managed Care – PPO | Admitting: Internal Medicine

## 2013-07-31 ENCOUNTER — Ambulatory Visit (INDEPENDENT_AMBULATORY_CARE_PROVIDER_SITE_OTHER): Payer: Medicare Other | Admitting: Internal Medicine

## 2013-07-31 ENCOUNTER — Encounter: Payer: Self-pay | Admitting: Internal Medicine

## 2013-07-31 VITALS — BP 112/70 | HR 72 | Temp 97.9°F | Resp 16

## 2013-07-31 DIAGNOSIS — K5900 Constipation, unspecified: Secondary | ICD-10-CM | POA: Diagnosis not present

## 2013-07-31 DIAGNOSIS — R945 Abnormal results of liver function studies: Secondary | ICD-10-CM

## 2013-07-31 MED ORDER — NORTRIPTYLINE HCL 75 MG PO CAPS: 75.0000 mg | ORAL_CAPSULE | Freq: Every day | ORAL | Status: DC

## 2013-07-31 MED ORDER — LUBIPROSTONE 24 MCG PO CAPS
24.0000 ug | ORAL_CAPSULE | Freq: Two times a day (BID) | ORAL | Status: DC
Start: 2013-07-31 — End: 2013-08-22

## 2013-07-31 MED ORDER — PANTOPRAZOLE SODIUM 40 MG PO TBEC: 40.0000 mg | DELAYED_RELEASE_TABLET | Freq: Every day | ORAL | Status: DC

## 2013-07-31 NOTE — Progress Notes (Signed)
   Subjective:    HPI   The patient needs to address  chronic hypertension that has been well controlled with medicines; to address chronic  hyperlipidemia controlled with medicines as well - now on simvastatin due to LFT elevation; and to address insomnia  F/u rice and bread get stuck x wks (Has had it in the past) - PPI helped a lot  Wt Readings from Last 3 Encounters:  07/10/12 140 lb (63.504 kg)   BP Readings from Last 3 Encounters:  07/31/13 112/70  01/24/13 106/80  09/11/12 106/82      Review of Systems  Constitutional: Negative for fever, chills, diaphoresis, activity change, appetite change, fatigue and unexpected weight change.  HENT: Negative for congestion, dental problem, ear pain, hearing loss, mouth sores, postnasal drip, sinus pressure, sneezing, sore throat and voice change.   Eyes: Negative for pain and visual disturbance.  Respiratory: Negative for cough, chest tightness, wheezing and stridor.   Cardiovascular: Negative for chest pain, palpitations and leg swelling.  Gastrointestinal: Negative for nausea, vomiting, abdominal pain, blood in stool, abdominal distention and rectal pain.  Genitourinary: Negative for dysuria, hematuria, decreased urine volume, vaginal bleeding, vaginal discharge, difficulty urinating, vaginal pain and menstrual problem.  Musculoskeletal: Negative for back pain, gait problem, joint swelling and neck pain.  Skin: Negative for color change, rash and wound.  Neurological: Negative for dizziness, tremors, syncope, speech difficulty and light-headedness.  Hematological: Negative for adenopathy.  Psychiatric/Behavioral: Positive for sleep disturbance. Negative for suicidal ideas, hallucinations, behavioral problems, confusion, dysphoric mood and decreased concentration. The patient is not hyperactive.        Objective:   Physical Exam  Constitutional: She appears well-developed. No distress.  Obese  HENT:  Head: Normocephalic.  Right  Ear: External ear normal.  Left Ear: External ear normal.  Nose: Nose normal.  Mouth/Throat: Oropharynx is clear and moist.  Wax B  Eyes: Conjunctivae are normal. Pupils are equal, round, and reactive to light. Right eye exhibits no discharge. Left eye exhibits no discharge.  Neck: Normal range of motion. Neck supple. No JVD present. No tracheal deviation present. No thyromegaly present.  Cardiovascular: Normal rate, regular rhythm and normal heart sounds.   Pulmonary/Chest: No stridor. No respiratory distress. She has no wheezes.  Abdominal: Soft. Bowel sounds are normal. She exhibits no distension and no mass. There is no tenderness. There is no rebound and no guarding.  Musculoskeletal: She exhibits no edema and no tenderness.  Lymphadenopathy:    She has no cervical adenopathy.  Neurological: She displays normal reflexes. No cranial nerve deficit. She exhibits normal muscle tone. Coordination normal.  Skin: No rash noted. No erythema.  Psychiatric: She has a normal mood and affect. Her behavior is normal. Judgment and thought content normal.    Lab Results  Component Value Date   WBC 4.5 07/05/2012   HGB 13.1 07/05/2012   HCT 39.1 07/05/2012   PLT 268.0 07/05/2012   GLUCOSE 97 01/24/2013   CHOL 231* 01/24/2013   TRIG 127 06/25/2013   HDL 72.20 01/24/2013   LDLDIRECT 135.5 01/24/2013   LDLCALC 150* 06/25/2013   ALT 33 06/25/2013   AST 34 06/25/2013   NA 137 01/24/2013   K 3.9 01/24/2013   CL 100 01/24/2013   CREATININE 0.7 01/24/2013   BUN 17 01/24/2013   CO2 30 01/24/2013   TSH 2.00 07/05/2012           Assessment & Plan:

## 2013-07-31 NOTE — Assessment & Plan Note (Signed)
Tart Amitiza

## 2013-07-31 NOTE — Assessment & Plan Note (Signed)
Mild due to Lipitor - resolved off Lipitor

## 2013-07-31 NOTE — Progress Notes (Signed)
Pre visit review using our clinic review tool, if applicable. No additional management support is needed unless otherwise documented below in the visit note. 

## 2013-08-03 ENCOUNTER — Other Ambulatory Visit: Payer: Self-pay | Admitting: Internal Medicine

## 2013-08-08 ENCOUNTER — Telehealth: Payer: Self-pay | Admitting: *Deleted

## 2013-08-08 NOTE — Telephone Encounter (Signed)
OK to fill this prescription with additional refills x1 Thank you!  

## 2013-08-08 NOTE — Telephone Encounter (Signed)
Rf req for Ambien 10 mg 1 po qhs prn sleep. # 90. Ok to Rf?

## 2013-08-09 NOTE — Telephone Encounter (Signed)
RX called in per MD. 

## 2013-08-22 ENCOUNTER — Other Ambulatory Visit: Payer: Self-pay | Admitting: *Deleted

## 2013-08-22 MED ORDER — LUBIPROSTONE 24 MCG PO CAPS: 24.0000 ug | ORAL_CAPSULE | Freq: Two times a day (BID) | ORAL | Status: DC

## 2013-10-01 ENCOUNTER — Other Ambulatory Visit (INDEPENDENT_AMBULATORY_CARE_PROVIDER_SITE_OTHER): Payer: Medicare Other

## 2013-10-01 DIAGNOSIS — E785 Hyperlipidemia, unspecified: Secondary | ICD-10-CM

## 2013-10-01 DIAGNOSIS — Z79899 Other long term (current) drug therapy: Secondary | ICD-10-CM

## 2013-10-01 LAB — HEPATIC FUNCTION PANEL
ALK PHOS: 97 U/L (ref 39–117)
ALT: 22 U/L (ref 0–35)
AST: 24 U/L (ref 0–37)
Albumin: 4.4 g/dL (ref 3.5–5.2)
Bilirubin, Direct: 0.1 mg/dL (ref 0.0–0.3)
TOTAL PROTEIN: 7.8 g/dL (ref 6.0–8.3)
Total Bilirubin: 0.6 mg/dL (ref 0.2–1.2)

## 2013-10-02 LAB — NMR LIPOPROFILE WITH LIPIDS
Cholesterol, Total: 241 mg/dL — ABNORMAL HIGH (ref <200)
HDL PARTICLE NUMBER: 39.9 umol/L (ref >=30.5)
HDL Size: 8.8 nm — ABNORMAL LOW (ref >=9.2)
HDL-C: 71 mg/dL (ref >=40)
LARGE VLDL-P: 2.5 nmol/L (ref <=2.7)
LDL (calc): 139 mg/dL — ABNORMAL HIGH (ref <100)
LDL Particle Number: 2041 nmol/L — ABNORMAL HIGH (ref <1000)
LDL SIZE: 20.9 nm (ref >20.5)
LP-IR Score: 46 — ABNORMAL HIGH (ref <=45)
Large HDL-P: 7.8 umol/L (ref >=4.8)
Small LDL Particle Number: 814 nmol/L — ABNORMAL HIGH (ref <=527)
Triglycerides: 157 mg/dL — ABNORMAL HIGH (ref <150)
VLDL Size: 44.6 nm (ref <=46.6)

## 2013-10-03 ENCOUNTER — Other Ambulatory Visit: Payer: Medicare Other

## 2013-10-04 ENCOUNTER — Ambulatory Visit (INDEPENDENT_AMBULATORY_CARE_PROVIDER_SITE_OTHER): Payer: Medicare Other | Admitting: Pharmacist

## 2013-10-04 DIAGNOSIS — E785 Hyperlipidemia, unspecified: Secondary | ICD-10-CM

## 2013-10-04 DIAGNOSIS — Z79899 Other long term (current) drug therapy: Secondary | ICD-10-CM

## 2013-10-04 MED ORDER — EZETIMIBE 10 MG PO TABS: 10.0000 mg | ORAL_TABLET | Freq: Every day | ORAL | Status: DC

## 2013-10-04 NOTE — Progress Notes (Signed)
Patient is a pleasant 68 y.o. WF referred to lipid clinic by Dr. Acie Fredrickson after LFTs trended up while she was taking atorvastatin 40 mg qd.  She stopped atorvastatin for 2 weeks, LFTs normalized, and then she was started on Crestor 10 mg qd, which ultimately showed normal LFTs.  Due to insurance not covering Crestor (>$200 per month), patient was started on simvastatin 40 mg in 06/2013.  LFTs normal today, and LDL-P ~ 2000 nmol/L on simvastatin.  Patient doesn't recall having an abdominal U/S, and I don't see one in her medical records.  She does carry her weight in her abdominal region, and given LFTs only slightly elevated in past, they may have been elevated due to fatty liver.  She has very strong family history of early heart disease, therefore a compelling reason to be on daily statin therapy and get LDL-P number to at least < 1300.   Her LDL was last 135 mg/dL on lipitor 40 mg qd,  LDL was 150 on Crestor 10 mg qd, and now LDL is 139 mg/dL on simvastatin 40 mg qd.  LDL-P number is 2000 nmol/L on Simvastatin 40 mg qd.  Risk Factors:  Family h/o CAD, age - LDL goal < 100 preferably given strong family history; non-HDL goal < 130;  LDL-P number goal at least < 1300, and prefer < 1000 if possible. Meds:  Simvastatin 40 mg qd Intolerant:  None, however LFTs slightly elevated on Lipitor 40 mg in the past. Crestor and Vytorin not covered by her insurance.  Family history:  Father died at 6 y.o. With MI, Mother had MI and CABG on two occasions (32 and 63 y.o.), brother has multiple stents, aunt has h/o CVA. Social history:  Denies alcohol use.  Denies tobacco use. Diet:  Much improved.  She now drinks a fruit and kale smoothie every morning for breakfast, and a low fat option lunch.  Dinner is typically cooked at home, but she goes out a few times per week.  Denies fried foods or soda.  Eats red meat 1-2 times per week.  Rarely eats fish.  She has cut out all the snack food she was eating in the past (potato  chips, cookies, etc). Exercise:  Great with this.  Works out 60 minutes daily on treadmill, then another 20 minutes on exercise bike.  She gets her 10,000 steps each day before she leaves the gym.  Goes to gym 6-7 days per week.  Labs:  09/2013:  LDL-P number 2041, LDL 139, HDL 71, TG 157, TC 241, small LDL particle number [id number], LFTs normal (simvastatin 40 mg qd)  06/2013:  LDL-P number 2053, LDL 150, HDL 76, TG 127, TC 251, small LDL particle number 976, LFTs normal (Crestor 10 mg qd) 05/16/13:  ALT 36 ,   AST 26 (off lipitor x 2 weeks) Early 05/03/2013:  ALT 83, AST 76 (on lipitor 40 mg qd) 01/2013:  TC 231, TG 154, HDL 72, LDL 135, ALT 51, AST 39 (on lipitor 40 mg qd)  Current Outpatient Prescriptions  Medication Sig Dispense Refill  . citalopram (CELEXA) 40 MG tablet Take 1 tablet (40 mg total) by mouth daily.  90 tablet  3  . lubiprostone (AMITIZA) 24 MCG capsule Take 1 capsule (24 mcg total) by mouth 2 (two) times daily with a meal.  60 capsule  11  . nortriptyline (PAMELOR) 75 MG capsule Take 1 capsule (75 mg total) by mouth at bedtime.  90 capsule  3  . pantoprazole (  PROTONIX) 40 MG tablet Take 1 tablet (40 mg total) by mouth daily.  90 tablet  3  . simvastatin (ZOCOR) 40 MG tablet Take 1 tablet (40 mg total) by mouth at bedtime.  90 tablet  1  . zolpidem (AMBIEN) 10 MG tablet Take 1 tablet (10 mg total) by mouth at bedtime as needed for sleep.  90 tablet  1   No current facility-administered medications for this visit.   Allergies  Allergen Reactions  . Linzess [Linaclotide]     Too strong  . Lipitor [Atorvastatin]     elev LFTs    Family History  Problem Relation Age of Onset  . Heart disease Mother 6    CABG  . Diabetes Mother   . Heart disease Father   . COPD Father   . Heart disease Brother   . Diabetes Brother

## 2013-10-04 NOTE — Assessment & Plan Note (Addendum)
Patient tolerating Simvastatin 40 mg qd, LFTs normal, and LDL trended down to 139 mg/dL. LDL-P number is still elevated ~ 2000 nmol/L, with goal of < 1300 nmol/L given her age and family h/o CAD.  Patient has h/o constipation so will not use BAS, and doesn't meet criteria for SPIRE clinical trial.  Will add Zetia 10 mg qd in hopes of getting another 20% LDL reduction.  It appears her insurance doesn't cover Vytorin from what she says, so we will put her on Zetia 10 mg along with simvastatin 40 mg qd.  Hopefully due to data behind IMPROVE-IT and SHARP we will be able to get Zetia covered if there is an issue.  Will recheck NMR / LFTs in 3 months, and see me a few days later. Plan: 1.  Add Zetia 10 mg once daily.  Take anytime. 2.  Continue Simvastatin 40 mg once daily in the evening. 3.  If insurance denies Zetia, can try a prior authorization as IMPROVE-IT and SHARP were positive -  will try to change to Vytorin (combo of simvastatin and Zetia) to see if this can maybe get through. 4.  Recheck NMR / Liver in 3 months (12/31/13 fasting labs, lab opens at 7:30 am), and see Ysidro Evert on 01/03/14 at 11:30

## 2013-10-04 NOTE — Patient Instructions (Signed)
1.  Add Zetia 10 mg once daily.  Take anytime. 2.  Continue Simvastatin 40 mg once daily in the evening. 3.  If insurance denies Zetia, will try to change to Vytorin (combo of simvastatin and Zetia) 4.  Recheck NMR / Liver in 3 months (12/31/13 fasting labs, lab opens at 7:30 am), and see Ysidro Evert on 01/03/14 at 11:30

## 2013-10-09 ENCOUNTER — Encounter: Payer: Self-pay | Admitting: Physician Assistant

## 2013-10-09 ENCOUNTER — Ambulatory Visit (INDEPENDENT_AMBULATORY_CARE_PROVIDER_SITE_OTHER): Payer: Medicare Other | Admitting: Physician Assistant

## 2013-10-09 VITALS — BP 100/70 | HR 78 | Temp 98.4°F | Resp 18

## 2013-10-09 DIAGNOSIS — H6120 Impacted cerumen, unspecified ear: Secondary | ICD-10-CM | POA: Diagnosis not present

## 2013-10-09 DIAGNOSIS — J069 Acute upper respiratory infection, unspecified: Secondary | ICD-10-CM

## 2013-10-09 DIAGNOSIS — H6123 Impacted cerumen, bilateral: Secondary | ICD-10-CM

## 2013-10-09 NOTE — Progress Notes (Signed)
Pre visit review using our clinic review tool, if applicable. No additional management support is needed unless otherwise documented below in the visit note. 

## 2013-10-09 NOTE — Patient Instructions (Addendum)
Over the counter earwax softener is available for use as needed if ears begin to feel full again, to prevent obstruction.  Plain Over the Counter Mucinex (NOT Mucinex D) for thick secretions  Force NON dairy fluids, drinking plenty of water is best.    Over the Counter Flonase OR Nasacort AQ 1 spray in each nostril twice a day as needed. Use the "crossover" technique into opposite nostril spraying toward opposite ear @ 45 degree angle, not straight up into nostril.   Plain Over the Counter Allegra (NOT D )  160 daily , OR Loratidine 10 mg , OR Zyrtec 10 mg @ bedtime  as needed for itchy eyes & sneezing.  Saline Irrigation and Saline Sprays can also help reduce symptoms.  If emergency symptoms discussed during visit developed, seek medical attention immediately.  Followup as needed, or for worsening or persistent symptoms despite treatment.    Upper Respiratory Infection, Adult An upper respiratory infection (URI) is also known as the common cold. It is often caused by a type of germ (virus). Colds are easily spread (contagious). You can pass it to others by kissing, coughing, sneezing, or drinking out of the same glass. Usually, you get better in 1 or 2 weeks.  HOME CARE   Only take medicine as told by your doctor.  Use a warm mist humidifier or breathe in steam from a hot shower.  Drink enough water and fluids to keep your pee (urine) clear or pale yellow.  Get plenty of rest.  Return to work when your temperature is back to normal or as told by your doctor. You may use a face mask and wash your hands to stop your cold from spreading. GET HELP RIGHT AWAY IF:   After the first few days, you feel you are getting worse.  You have questions about your medicine.  You have chills, shortness of breath, or brown or red spit (mucus).  You have yellow or brown snot (nasal discharge) or pain in the face, especially when you bend forward.  You have a fever, puffy (swollen) neck, pain  when you swallow, or white spots in the back of your throat.  You have a bad headache, ear pain, sinus pain, or chest pain.  You have a high-pitched whistling sound when you breathe in and out (wheezing).  You have a lasting cough or cough up blood.  You have sore muscles or a stiff neck. MAKE SURE YOU:   Understand these instructions.  Will watch your condition.  Will get help right away if you are not doing well or get worse. Document Released: 08/25/2007 Document Revised: 05/31/2011 Document Reviewed: 07/13/2010 [facility] Patient Information 2015 [place], [place]. This information is not intended to replace advice given to you by your health care provider. Make sure you discuss any questions you have with your health care provider. Cerumen Impaction A cerumen impaction is when the wax in your ear forms a plug. This plug usually causes reduced hearing. Sometimes it also causes an earache or dizziness. Removing a cerumen impaction can be difficult and painful. The wax sticks to the ear canal. The canal is sensitive and bleeds easily. If you try to remove a heavy wax buildup with a cotton tipped swab, you may push it in further. Irrigation with water, suction, and small ear curettes may be used to clear out the wax. If the impaction is fixed to the skin in the ear canal, ear drops may be needed for a few days to loosen  the wax. People who build up a lot of wax frequently can use ear wax removal products available in your local drugstore. SEEK MEDICAL CARE IF:  You develop an earache, increased hearing loss, or marked dizziness. Document Released: 04/15/2004 Document Revised: 05/31/2011 Document Reviewed: 06/05/2009 [facility] Patient Information 2015 [place], [place]. This information is not intended to replace advice given to you by your health care provider. Make sure you discuss any questions you have with your health care provider.

## 2013-10-09 NOTE — Progress Notes (Signed)
Subjective:    Patient ID: Evelyn Murphy, female    DOB: 06/17/1945, 68 y.o.   MRN: [id number]  Cough This is a new problem. The current episode started in the past 7 days (3 days ago). The problem has been gradually worsening. The problem occurs hourly. The cough is productive of sputum. Associated symptoms include chills, ear congestion, ear pain (mostly left ear) and a fever. Pertinent negatives include no chest pain, headaches, heartburn, hemoptysis, myalgias, nasal congestion, postnasal drip, rash, rhinorrhea, sore throat, shortness of breath, sweats, weight loss or wheezing (has noticed this recently, has resolved now.). Nothing aggravates the symptoms. Treatments tried: aspirin. The treatment provided no relief. There is no history of asthma, COPD or environmental allergies.   She states that she has had a cerumen impaction in the past, requiring lavage in office. This feels similar, per pt.   Review of Systems  Constitutional: Positive for fever and chills. Negative for weight loss.  HENT: Positive for ear pain (mostly left ear) and hearing loss. Negative for postnasal drip, rhinorrhea, sinus pressure, sore throat and tinnitus.   Respiratory: Positive for cough. Negative for hemoptysis, shortness of breath and wheezing (has noticed this recently, has resolved now.).   Cardiovascular: Negative for chest pain.  Gastrointestinal: Negative for heartburn, nausea, vomiting and diarrhea.  Musculoskeletal: Negative for myalgias.  Skin: Negative for rash.  Allergic/Immunologic: Negative for environmental allergies.  Neurological: Negative for syncope and headaches.  All other systems reviewed and are negative.  Past Medical History  Diagnosis Date  . Hyperlipidemia   . Depression   . Insomnia     History   Social History  . Marital Status: Married    Spouse Name: N/A    Number of Children: N/A  . Years of Education: N/A   Occupational History  . Not on file.   Social History  Main Topics  . Smoking status: Never Smoker   . Smokeless tobacco: Not on file  . Alcohol Use: No  . Drug Use: No  . Sexual Activity: Not on file   Other Topics Concern  . Not on file   Social History Narrative  . No narrative on file    Past Surgical History  Procedure Laterality Date  . Shoulder arthroscopy  10/2011    R Dr Meyer Cory    Family History  Problem Relation Age of Onset  . Heart disease Mother 26    CABG  . Diabetes Mother   . Heart disease Father   . COPD Father   . Heart disease Brother   . Diabetes Brother     Allergies  Allergen Reactions  . Linzess [Linaclotide]     Too strong  . Lipitor [Atorvastatin]     elev LFTs    Current Outpatient Prescriptions on File Prior to Visit  Medication Sig Dispense Refill  . citalopram (CELEXA) 40 MG tablet Take 1 tablet (40 mg total) by mouth daily.  90 tablet  3  . ezetimibe (ZETIA) 10 MG tablet Take 1 tablet (10 mg total) by mouth daily.  30 tablet  5  . lubiprostone (AMITIZA) 24 MCG capsule Take 1 capsule (24 mcg total) by mouth 2 (two) times daily with a meal.  60 capsule  11  . nortriptyline (PAMELOR) 75 MG capsule Take 1 capsule (75 mg total) by mouth at bedtime.  90 capsule  3  . pantoprazole (PROTONIX) 40 MG tablet Take 1 tablet (40 mg total) by mouth daily.  90 tablet  3  .  simvastatin (ZOCOR) 40 MG tablet Take 1 tablet (40 mg total) by mouth at bedtime.  90 tablet  1  . zolpidem (AMBIEN) 10 MG tablet Take 1 tablet (10 mg total) by mouth at bedtime as needed for sleep.  90 tablet  1   No current facility-administered medications on file prior to visit.    EXAM: BP 100/70  Pulse 78  Temp(Src) 98.4 F (36.9 C) (Oral)  Resp 18     Objective:   Physical Exam  Nursing note and vitals reviewed. Constitutional: She is oriented to person, place, and time. She appears well-developed and well-nourished. No distress.  HENT:  Head: Normocephalic and atraumatic.  Nose: Nose normal.  Mouth/Throat:  Oropharynx is clear and moist. No oropharyngeal exudate.  Bilat cerumen impaction. Lavage successful in office. Bilat TMs normal. Bilat frontal and max sinuses non-ttp.  Eyes: Conjunctivae and EOM are normal. Pupils are equal, round, and reactive to light.  Neck: Neck supple.  Cardiovascular: Normal rate, regular rhythm and intact distal pulses.   Pulmonary/Chest: Effort normal and breath sounds normal. No stridor. No respiratory distress. She has no wheezes. She has no rales. She exhibits no tenderness.  No wheezes heard.  Lymphadenopathy:    She has no cervical adenopathy.  Neurological: She is alert and oriented to person, place, and time.  Skin: Skin is warm and dry. No rash noted. She is not diaphoretic. No erythema. No pallor.  Psychiatric: She has a normal mood and affect. Her behavior is normal. Judgment and thought content normal.     Lab Results  Component Value Date   WBC 4.5 07/05/2012   HGB 13.1 07/05/2012   HCT 39.1 07/05/2012   PLT 268.0 07/05/2012   GLUCOSE 97 01/24/2013   CHOL 231* 01/24/2013   TRIG 157* 10/01/2013   HDL 72.20 01/24/2013   LDLDIRECT 135.5 01/24/2013   LDLCALC 139* 10/01/2013   ALT 22 10/01/2013   AST 24 10/01/2013   NA 137 01/24/2013   K 3.9 01/24/2013   CL 100 01/24/2013   CREATININE 0.7 01/24/2013   BUN 17 01/24/2013   CO2 30 01/24/2013   TSH 2.00 07/05/2012        Assessment & Plan:  Roan was seen today for left earache and cough.  Diagnoses and associated orders for this visit:  URI (upper respiratory infection) Comments: Mild cough, infrequent, will have pt push fluids, rest, add mucinex to help bring up mucus.  Cerumen impaction, bilateral Comments: Lavage in office. Advised on OTC ear wax softening drops to prevent reoccurrence.    Pt will push fluids, rest, and add OTC mucinex to help cold symptoms. OTC earwax softener as needed if ears begin to feel full again.  Return precautions provided, and patient handout on URI and cerumen  impaction.  Plan to follow up as needed, or for worsening or persistent symptoms despite treatment.  Patient Instructions  Over the counter earwax softener is available for use as needed if ears begin to feel full again, to prevent obstruction.  Plain Over the Counter Mucinex (NOT Mucinex D) for thick secretions  Force NON dairy fluids, drinking plenty of water is best.    Over the Counter Flonase OR Nasacort AQ 1 spray in each nostril twice a day as needed. Use the "crossover" technique into opposite nostril spraying toward opposite ear @ 45 degree angle, not straight up into nostril.   Plain Over the Counter Allegra (NOT D )  160 daily , OR Loratidine 10 mg , OR  Zyrtec 10 mg @ bedtime  as needed for itchy eyes & sneezing.  Saline Irrigation and Saline Sprays can also help reduce symptoms.  If emergency symptoms discussed during visit developed, seek medical attention immediately.  Followup as needed, or for worsening or persistent symptoms despite treatment.

## 2013-10-29 ENCOUNTER — Ambulatory Visit (INDEPENDENT_AMBULATORY_CARE_PROVIDER_SITE_OTHER): Payer: Medicare Other | Admitting: Internal Medicine

## 2013-10-29 ENCOUNTER — Encounter: Payer: Self-pay | Admitting: Internal Medicine

## 2013-10-29 VITALS — BP 112/82 | HR 81 | Temp 98.0°F

## 2013-10-29 DIAGNOSIS — H6982 Other specified disorders of Eustachian tube, left ear: Secondary | ICD-10-CM

## 2013-10-29 DIAGNOSIS — R05 Cough: Secondary | ICD-10-CM | POA: Diagnosis not present

## 2013-10-29 DIAGNOSIS — R059 Cough, unspecified: Secondary | ICD-10-CM

## 2013-10-29 DIAGNOSIS — H6980 Other specified disorders of Eustachian tube, unspecified ear: Secondary | ICD-10-CM

## 2013-10-29 MED ORDER — BENZONATATE 200 MG PO CAPS: 200.0000 mg | ORAL_CAPSULE | Freq: Three times a day (TID) | ORAL | Status: DC | PRN

## 2013-10-29 NOTE — Progress Notes (Signed)
Patient refused to be weighed.

## 2013-10-29 NOTE — Patient Instructions (Signed)
Go to [facility] for eustachian tube dysfunction. Drink thin  fluids liberally through the day and chew sugarless gum . Do the Valsalva maneuver several times a day to "pop" ears open. Flonase  OR Nasacort AQ 1 spray in each nostril twice a day as needed. Use the "crossover" technique as discussed.

## 2013-10-29 NOTE — Progress Notes (Signed)
   Subjective:    Patient ID: Evelyn Murphy, female    DOB: 1945-09-03, 68 y.o.   MRN: [id number]  HPI   Approximately 10 days ago she had her ears washed out at an urgent care by a 46 assistant.  Since that time @ approximately 3-4 PM she'll experience an earache which lasts overnight. She's been taking 3 aspirin for relief  She also has a nonproductive cough which is associated with some urinary incontinence  The cough is paroxysmal and brief.  In the past she's been treated with Detrol for incontinence.      Review of Systems Frontal headache, facial pain , nasal purulence, dental pain, sore throat , otic pain or otic discharge denied. No fever , chills or sweats.     Objective:   Physical Exam Pertinent positive findings include: The left tympanic membrane has decreased light reflex; fluid collection behind eardrum is suggested.  General appearance:good health ;well nourished; no acute distress or increased work of breathing is present.  No  lymphadenopathy about the head, neck, or axilla noted.   Eyes: No conjunctival inflammation or lid edema is present. There is no scleral icterus.  Ears:  External ear exam shows no significant lesions or deformities.  Otoscopic examination reveals clear canals, tympanic membranes are intact bilaterally without bulging, retraction, inflammation or discharge.  Nose:  External nasal examination shows no deformity or inflammation. Nasal mucosa are pink and moist without lesions or exudates. No septal dislocation or deviation.No obstruction to airflow.   Oral exam: Dental hygiene is good; lips and gums are healthy appearing.There is no oropharyngeal erythema or exudate noted.   Neck:  No deformities, thyromegaly, masses, or tenderness noted.   Supple with full range of motion without pain.   Heart:  Normal rate and regular rhythm. S1 and S2 normal without gallop, murmur, click, rub or other extra sounds.   Lungs:Chest clear to  auscultation; no wheezes, rhonchi,rales ,or rubs present.No increased work of breathing.    Extremities:  No cyanosis, edema, or clubbing  noted    Skin: Warm & dry         Assessment & Plan:  #1 eustachian tube dysfunction  Plan: as per protocol in AVS

## 2013-10-29 NOTE — Progress Notes (Signed)
Pre visit review using our clinic review tool, if applicable. No additional management support is needed unless otherwise documented below in the visit note. 

## 2013-11-22 ENCOUNTER — Ambulatory Visit (INDEPENDENT_AMBULATORY_CARE_PROVIDER_SITE_OTHER)
Admission: RE | Admit: 2013-11-22 | Discharge: 2013-11-22 | Disposition: A | Discharge status: Home / Self Care | Payer: Medicare Other | Source: Ambulatory Visit | Attending: Internal Medicine | Admitting: Internal Medicine

## 2013-11-22 ENCOUNTER — Encounter: Payer: Self-pay | Admitting: Internal Medicine

## 2013-11-22 ENCOUNTER — Ambulatory Visit (INDEPENDENT_AMBULATORY_CARE_PROVIDER_SITE_OTHER): Payer: Medicare Other | Admitting: Internal Medicine

## 2013-11-22 VITALS — BP 102/70 | HR 68 | Temp 98.0°F | Resp 16

## 2013-11-22 DIAGNOSIS — M25519 Pain in unspecified shoulder: Secondary | ICD-10-CM | POA: Diagnosis not present

## 2013-11-22 DIAGNOSIS — R079 Chest pain, unspecified: Secondary | ICD-10-CM

## 2013-11-22 DIAGNOSIS — M25511 Pain in right shoulder: Secondary | ICD-10-CM

## 2013-11-22 DIAGNOSIS — R1310 Dysphagia, unspecified: Secondary | ICD-10-CM

## 2013-11-22 DIAGNOSIS — Z0000 Encounter for general adult medical examination without abnormal findings: Secondary | ICD-10-CM | POA: Diagnosis not present

## 2013-11-22 DIAGNOSIS — M25512 Pain in left shoulder: Secondary | ICD-10-CM

## 2013-11-22 NOTE — Assessment & Plan Note (Signed)
ECHO GI consult

## 2013-11-22 NOTE — Assessment & Plan Note (Signed)
Continue with current prescription therapy as reflected on the Med list. GI consult - sx's are worse

## 2013-11-22 NOTE — Assessment & Plan Note (Signed)
May need an injection

## 2013-11-22 NOTE — Progress Notes (Signed)
Subjective:    Chest Pain  This is a new problem. The current episode started in the past 7 days (sun night). The onset quality is sudden. The problem occurs constantly. The problem has been resolved. The pain is present in the substernal region (between shoulder blades). The pain is mild. The quality of the pain is described as heavy and dull. Radiates to: shoulder blades. Pertinent negatives include no abdominal pain, back pain, cough, diaphoresis, dizziness, fever, irregular heartbeat, nausea, near-syncope, orthopnea, palpitations, shortness of breath, vomiting or weakness.   C/o L shoulder pain - worse w/ROM  The patient needs to address  chronic hypertension that has been well controlled with medicines; to address chronic  hyperlipidemia controlled with medicines as well - now on simvastatin due to LFT elevation; and to address insomnia  F/u rice and bread get stuck x long time (Has had it in the past) - PPI helped a lot  Wt Readings from Last 3 Encounters:  07/10/12 140 lb (63.504 kg)   BP Readings from Last 3 Encounters:  11/22/13 102/70  10/29/13 112/82  10/09/13 100/70      Review of Systems  Constitutional: Negative for fever, chills, diaphoresis, activity change, appetite change, fatigue and unexpected weight change.  HENT: Negative for congestion, dental problem, ear pain, hearing loss, mouth sores, postnasal drip, sinus pressure, sneezing, sore throat and voice change.   Eyes: Negative for pain and visual disturbance.  Respiratory: Negative for cough, chest tightness, shortness of breath, wheezing and stridor.   Cardiovascular: Negative for chest pain, palpitations, orthopnea, leg swelling and near-syncope.  Gastrointestinal: Negative for nausea, vomiting, abdominal pain, blood in stool, abdominal distention and rectal pain.  Genitourinary: Negative for dysuria, hematuria, decreased urine volume, vaginal bleeding, vaginal discharge, difficulty urinating, vaginal pain  and menstrual problem.  Musculoskeletal: Negative for back pain, gait problem, joint swelling and neck pain.  Skin: Negative for color change, rash and wound.  Neurological: Negative for dizziness, tremors, syncope, speech difficulty, weakness and light-headedness.  Hematological: Negative for adenopathy.  Psychiatric/Behavioral: Positive for sleep disturbance. Negative for suicidal ideas, hallucinations, behavioral problems, confusion, dysphoric mood and decreased concentration. The patient is not hyperactive.        Objective:   Physical Exam  Constitutional: She appears well-developed. No distress.  Obese  HENT:  Head: Normocephalic.  Right Ear: External ear normal.  Left Ear: External ear normal.  Nose: Nose normal.  Mouth/Throat: Oropharynx is clear and moist.  Wax B  Eyes: Conjunctivae are normal. Pupils are equal, round, and reactive to light. Right eye exhibits no discharge. Left eye exhibits no discharge.  Neck: Normal range of motion. Neck supple. No JVD present. No tracheal deviation present. No thyromegaly present.  Cardiovascular: Normal rate, regular rhythm and normal heart sounds.   Pulmonary/Chest: No stridor. No respiratory distress. She has no wheezes.  Abdominal: Soft. Bowel sounds are normal. She exhibits no distension and no mass. There is no tenderness. There is no rebound and no guarding.  Musculoskeletal: She exhibits no edema and no tenderness.  Lymphadenopathy:    She has no cervical adenopathy.  Neurological: She displays normal reflexes. No cranial nerve deficit. She exhibits normal muscle tone. Coordination normal.  Skin: No rash noted. No erythema.  Psychiatric: She has a normal mood and affect. Her behavior is normal. Judgment and thought content normal.  L shoulder is tender w/ROM  Lab Results  Component Value Date   WBC 4.5 07/05/2012   HGB 13.1 07/05/2012   HCT 39.1  07/05/2012   PLT 268.0 07/05/2012   GLUCOSE 97 01/24/2013   CHOL 231* 01/24/2013    TRIG 157* 10/01/2013   HDL 72.20 01/24/2013   LDLDIRECT 135.5 01/24/2013   LDLCALC 139* 10/01/2013   ALT 22 10/01/2013   AST 24 10/01/2013   NA 137 01/24/2013   K 3.9 01/24/2013   CL 100 01/24/2013   CREATININE 0.7 01/24/2013   BUN 17 01/24/2013   CO2 30 01/24/2013   TSH 2.00 07/05/2012           Assessment & Plan:

## 2013-11-22 NOTE — Progress Notes (Signed)
Pre visit review using our clinic review tool, if applicable. No additional management support is needed unless otherwise documented below in the visit note. 

## 2013-11-22 NOTE — Assessment & Plan Note (Signed)
Better  

## 2013-11-27 ENCOUNTER — Other Ambulatory Visit (HOSPITAL_COMMUNITY): Payer: Medicare Other

## 2013-11-29 ENCOUNTER — Encounter: Payer: Self-pay | Admitting: Internal Medicine

## 2013-11-30 DIAGNOSIS — Z23 Encounter for immunization: Secondary | ICD-10-CM | POA: Diagnosis not present

## 2013-12-04 ENCOUNTER — Other Ambulatory Visit (HOSPITAL_COMMUNITY): Payer: Medicare Other

## 2013-12-05 ENCOUNTER — Ambulatory Visit (HOSPITAL_COMMUNITY): Payer: Medicare Other | Attending: Cardiovascular Disease | Admitting: Radiology

## 2013-12-05 DIAGNOSIS — E785 Hyperlipidemia, unspecified: Secondary | ICD-10-CM | POA: Insufficient documentation

## 2013-12-05 DIAGNOSIS — R079 Chest pain, unspecified: Secondary | ICD-10-CM

## 2013-12-05 DIAGNOSIS — R072 Precordial pain: Secondary | ICD-10-CM | POA: Diagnosis not present

## 2013-12-05 DIAGNOSIS — I10 Essential (primary) hypertension: Secondary | ICD-10-CM | POA: Insufficient documentation

## 2013-12-05 DIAGNOSIS — M25512 Pain in left shoulder: Secondary | ICD-10-CM

## 2013-12-05 NOTE — Progress Notes (Signed)
Echocardiogram performed.  

## 2013-12-11 ENCOUNTER — Other Ambulatory Visit: Payer: Self-pay | Admitting: Internal Medicine

## 2013-12-11 DIAGNOSIS — R931 Abnormal findings on diagnostic imaging of heart and coronary circulation: Secondary | ICD-10-CM

## 2013-12-20 ENCOUNTER — Ambulatory Visit (INDEPENDENT_AMBULATORY_CARE_PROVIDER_SITE_OTHER): Payer: Medicare Other | Admitting: Physician Assistant

## 2013-12-20 ENCOUNTER — Encounter: Payer: Self-pay | Admitting: Physician Assistant

## 2013-12-20 VITALS — BP 105/72 | HR 70 | Ht 63.0 in | Wt 140.0 lb

## 2013-12-20 DIAGNOSIS — R079 Chest pain, unspecified: Secondary | ICD-10-CM

## 2013-12-20 DIAGNOSIS — E785 Hyperlipidemia, unspecified: Secondary | ICD-10-CM

## 2013-12-20 DIAGNOSIS — I5189 Other ill-defined heart diseases: Secondary | ICD-10-CM | POA: Insufficient documentation

## 2013-12-20 DIAGNOSIS — I519 Heart disease, unspecified: Secondary | ICD-10-CM

## 2013-12-20 DIAGNOSIS — I4510 Unspecified right bundle-branch block: Secondary | ICD-10-CM | POA: Diagnosis not present

## 2013-12-20 NOTE — Patient Instructions (Signed)
Your physician recommends that you continue on your current medications as directed. Please refer to the Current Medication list given to you today.  Your physician has requested that you have an exercise tolerance test. For further information please visit [email or web address]. Please also follow instruction sheet, as given.  Your physician recommends that you schedule a follow-up appointment in: 3 months with Dr.[name]

## 2013-12-20 NOTE — Progress Notes (Signed)
[facility], [place], [place]  [address] Phone: [phone] Fax:  [phone]  Date:  12/20/2013   Patient ID:  Evelyn, Murphy 1945/07/27, MRN [id number]   PCP:  Walker Kehr, MD  Cardiologist:  Dr. Acie Fredrickson   History of Present Illness: Evelyn Murphy is a 68 y.o. female with no history of CAD but a strong family history of such as well as HLD. She has seen Dr. Acie Fredrickson in the past for risk stratification given that her dad died of MI at 1 and mom had MI at 46. He wanted her to work on her cholesterol so she has been followed in our pharm-driven lipid clinic. She presents for evaluation of chest pain. A month ago (early September), she had a day where she just didn't feel quite right. She describes a vague chest discomfort that she has a hard time further describing. It was on/off lasting hours at a time. She denies associated SOB, nausea, vomiting, palpitations, or syncope. It was not made worse with anything. She saw her PCP at which time she also noted sensation of rice and bread getting stuck on the way down. She has been taking PPI since that time without any recurrence of either dysphagia or CP. She exercises 2 hours a day on the elliptical and treadmill without any recent exertional symptoms or limitations.   2D echo ordered by PCP 12/05/13: LVEF 55-60%, mild LVH, normal LA size, trivial AI, trace MR, mild TR, grade 1 diastolic diastolic dysfunction, indeterminate LV Filling pressure. She has also been referred to GI. She is scheduled for endoscopy.  Recent Labs: 01/24/2013: Creatinine 0.7; Direct LDL 135.5; HDL Cholesterol by NMR 72.20; Potassium 3.9  10/01/2013: ALT 22; LDL (calc) 139*   Wt Readings from Last 3 Encounters:  12/20/13 140 lb (63.504 kg)  07/10/12 140 lb (63.504 kg)     Past Medical History  Diagnosis Date  . Hyperlipidemia   . Anxiety   . Insomnia   . RBBB     Current Outpatient Prescriptions  Medication Sig Dispense Refill  . citalopram  (CELEXA) 40 MG tablet Take 1 tablet (40 mg total) by mouth daily.  90 tablet  3  . ezetimibe (ZETIA) 10 MG tablet Take 1 tablet (10 mg total) by mouth daily.  30 tablet  5  . nortriptyline (PAMELOR) 75 MG capsule Take 1 capsule (75 mg total) by mouth at bedtime.  90 capsule  3  . pantoprazole (PROTONIX) 40 MG tablet Take 1 tablet (40 mg total) by mouth daily.  90 tablet  3  . simvastatin (ZOCOR) 40 MG tablet Take 1 tablet (40 mg total) by mouth at bedtime.  90 tablet  1  . zolpidem (AMBIEN) 10 MG tablet Take 10 mg by mouth at bedtime as needed for sleep.       No current facility-administered medications for this visit.    Allergies:   Linzess and Lipitor   Social History:  The patient  reports that she has never smoked. She does not have any smokeless tobacco history on file. She reports that she does not drink alcohol or use illicit drugs.   Family History:  The patient's family history includes COPD in her father; Diabetes in her brother and mother; Heart disease in her brother and father; Heart disease (age of onset: 40) in her mother.   ROS:  Please see the history of present illness.   All other systems reviewed and negative.   PHYSICAL  EXAM:  VS:  BP 105/72  Pulse 70  Ht 5\' 3"  (1.6 m)  Wt 140 lb (63.504 kg)  BMI 24.81 kg/m2 Well nourished, well developed, well appearing WF in no acute distress HEENT: normal Neck: no JVD, carotids without bruit Cardiac:  normal S1, S2; RRR; no murmur Lungs:  clear to auscultation bilaterally, no wheezing, rhonchi or rales Abd: soft, nontender, no hepatomegaly Ext: no edema Skin: warm and dry Neuro:  moves all extremities spontaneously, no focal abnormalities noted  EKG:  11/22/13 (patient deferred EKG today): NSR 81bpm, RBBB, nonspecific ST-T changes likely r/t RBBB  ASSESSMENT AND PLAN:  1. Chest pain - atypical, possibly related to GERD or stricture. No recurrence of symptoms. EKG not significantly changed. She exercises 2 hours a day  without any recent limitation. Given diastolic dysfunction, HLD, and family history of CAD, the patient is interested in a stress test for risk stratification - will plan an ETT which will also give [place] some information on her blood pressure with exercise. If symptoms recur, we might consider event monitoring since the discomfort was very vague. She will let us know if it recurs.  2. Diastolic dysfunction by EKG - no hx of HTN. Asymptomatic. No clinical history or evidence of CHF. See above. Salt restriction discussed along with warning signs of CHF. 3. HLD - followed in our lipid clinic with appointment later this month.  Dispo: F/u Dr. Acie Fredrickson in 3 months.  Signed, Melina Copa, PA-C  12/20/2013 2:30 PM

## 2013-12-28 DIAGNOSIS — D3131 Benign neoplasm of right choroid: Secondary | ICD-10-CM | POA: Diagnosis not present

## 2013-12-31 ENCOUNTER — Other Ambulatory Visit (INDEPENDENT_AMBULATORY_CARE_PROVIDER_SITE_OTHER): Payer: Medicare Other

## 2013-12-31 ENCOUNTER — Other Ambulatory Visit: Payer: Medicare Other

## 2013-12-31 DIAGNOSIS — Z79899 Other long term (current) drug therapy: Secondary | ICD-10-CM

## 2013-12-31 DIAGNOSIS — E785 Hyperlipidemia, unspecified: Secondary | ICD-10-CM | POA: Diagnosis not present

## 2013-12-31 LAB — LIPID PANEL
Cholesterol: 182 mg/dL (ref 0–200)
HDL: 60.5 mg/dL (ref >39.00)
LDL Cholesterol: 91 mg/dL (ref 0–99)
NonHDL: 121.5
TRIGLYCERIDES: 153 mg/dL — AB (ref 0.0–149.0)
Total CHOL/HDL Ratio: 3
VLDL: 30.6 mg/dL (ref 0.0–40.0)

## 2013-12-31 LAB — HEPATIC FUNCTION PANEL
ALK PHOS: 93 U/L (ref 39–117)
ALT: 27 U/L (ref 0–35)
AST: 26 U/L (ref 0–37)
Albumin: 4 g/dL (ref 3.5–5.2)
Bilirubin, Direct: 0.1 mg/dL (ref 0.0–0.3)
TOTAL PROTEIN: 8.1 g/dL (ref 6.0–8.3)
Total Bilirubin: 0.6 mg/dL (ref 0.2–1.2)

## 2014-01-02 ENCOUNTER — Telehealth: Payer: Self-pay | Admitting: Pharmacist

## 2014-01-02 DIAGNOSIS — E785 Hyperlipidemia, unspecified: Secondary | ICD-10-CM

## 2014-01-02 NOTE — Telephone Encounter (Signed)
Called pt to discuss results of lipid profile.   She is currently taking simvastatin 40mg  and Zetia 5mg  (cutting 10mg  tablet in half). States she is tolerating these with no issues.    Risk Factors: Family h/o CAD, age - LDL goal < 100 preferably given strong family history; non-HDL goal < 130; LDL-P number goal at least < 1300, and prefer < 1000 if possible.  Meds: Simvastatin 40 mg qd, Zetia 5mg  daily   Labs:  12/2013: LDL 91, HDL- 61, TG- 153, TC- 182 09/2013: LDL-P number 2041, LDL 139, HDL 71, TG 157, TC 241, small LDL particle number 814, LFTs normal (simvastatin 40 mg qd)  06/2013: LDL-P number 2053, LDL 150, HDL 76, TG 127, TC 251, small LDL particle number 976, LFTs normal (Crestor 10 mg qd)  05/16/13: ALT 36 , AST 26 (off lipitor x 2 weeks)  Early 05/03/2013: ALT 83, AST 76 (on lipitor 40 mg qd)  01/2013: TC 231, TG 154, HDL 72, LDL 135, ALT 51, AST 39 (on lipitor 40 mg qd)  Labs much improved with addition of Zetia.  Will continue current therapy and have her recheck labs prior to appt with Dr. Acie Fredrickson in December.

## 2014-01-03 ENCOUNTER — Ambulatory Visit: Payer: Medicare Other | Admitting: Pharmacist

## 2014-01-07 ENCOUNTER — Ambulatory Visit: Payer: Medicare Other | Admitting: Physician Assistant

## 2014-01-11 ENCOUNTER — Ambulatory Visit: Payer: Medicare Other | Admitting: Internal Medicine

## 2014-01-17 ENCOUNTER — Ambulatory Visit (INDEPENDENT_AMBULATORY_CARE_PROVIDER_SITE_OTHER): Payer: Medicare Other

## 2014-01-17 DIAGNOSIS — R079 Chest pain, unspecified: Secondary | ICD-10-CM | POA: Diagnosis not present

## 2014-01-17 NOTE — Progress Notes (Signed)
Exercise Treadmill Test  Pre-Exercise Testing Evaluation Rhythm: normal sinus  Rate: 79 bpm     Test  Exercise Tolerance Test Ordering MD: [name], MD  Interpreting MD: Melina Copa, PA-C  Unique Test No: 1  Treadmill:  1  Indication for ETT: chest pain - rule out ischemia  Contraindication to ETT: No   Stress Modality: exercise - treadmill  Cardiac Imaging Performed: non   Protocol: standard Bruce - maximal  Max BP:  157/75  Max MPHR (bpm):  154 85% MPR (bpm):  131  MPHR obtained (bpm):  131 % MPHR obtained:  85%  Reached 85% MPHR (min:sec):  9:10 Total Exercise Time (min-sec): 9:13  Workload in METS:  10.4 Borg Scale: 17  Reason ETT Terminated:  patient preferred not to jog - felt like she would fall off treadmill if she continued to run    ST Segment Analysis At Rest: nonspecific ST-T changes due to RBBB With Exercise: no evidence of significant ST depression  Other Information Arrhythmia:  No Angina during ETT:  absent (0) Quality of ETT:  diagnostic  ETT Interpretation:  normal - no evidence of ischemia by ST analysis  Comments: Baseline EKG shows NSR with RBBB. (I had reviewed with DOD the day the patient was seen in clinic that ETT was OK to perform with RBBB.) She has not had any further chest pain since last evaluation. She had above average exercise tolerance. No acute EKG changes. Normal BP response to exercise. Normal HR response. Test terminated due to patient feeling like she would fall off treadmill if she continued to jog. No chest pain. Dyspnea was appropriate for level of exercise.  Patient felt back to baseline in recovery and EKG stable.  Recommendations: Normal ETT. F/u Dr. Acie Fredrickson as planned.

## 2014-01-18 NOTE — Progress Notes (Signed)
Negative Treadmill

## 2014-01-31 ENCOUNTER — Ambulatory Visit (INDEPENDENT_AMBULATORY_CARE_PROVIDER_SITE_OTHER): Payer: Medicare Other | Admitting: Internal Medicine

## 2014-01-31 ENCOUNTER — Encounter: Payer: Self-pay | Admitting: Internal Medicine

## 2014-01-31 ENCOUNTER — Other Ambulatory Visit (INDEPENDENT_AMBULATORY_CARE_PROVIDER_SITE_OTHER): Payer: Medicare Other

## 2014-01-31 VITALS — BP 110/80 | HR 78 | Temp 98.3°F

## 2014-01-31 DIAGNOSIS — E785 Hyperlipidemia, unspecified: Secondary | ICD-10-CM

## 2014-01-31 DIAGNOSIS — F418 Other specified anxiety disorders: Secondary | ICD-10-CM

## 2014-01-31 DIAGNOSIS — I5189 Other ill-defined heart diseases: Secondary | ICD-10-CM

## 2014-01-31 DIAGNOSIS — I519 Heart disease, unspecified: Secondary | ICD-10-CM

## 2014-01-31 DIAGNOSIS — R350 Frequency of micturition: Secondary | ICD-10-CM | POA: Diagnosis not present

## 2014-01-31 LAB — BASIC METABOLIC PANEL
BUN: 19 mg/dL (ref 6–23)
CO2: 31 mEq/L (ref 19–32)
Calcium: 10.1 mg/dL (ref 8.4–10.5)
Chloride: 102 mEq/L (ref 96–112)
Creatinine, Ser: 0.9 mg/dL (ref 0.4–1.2)
GFR: 64.82 mL/min (ref >60.00)
Glucose, Bld: 106 mg/dL — ABNORMAL HIGH (ref 70–99)
POTASSIUM: 4.8 meq/L (ref 3.5–5.1)
SODIUM: 139 meq/L (ref 135–145)

## 2014-01-31 LAB — URINALYSIS
Bilirubin Urine: NEGATIVE
HGB URINE DIPSTICK: NEGATIVE
KETONES UR: NEGATIVE
Leukocytes, UA: NEGATIVE
Nitrite: NEGATIVE
Specific Gravity, Urine: <=1.005 — AB (ref 1.000–1.030)
Total Protein, Urine: NEGATIVE
URINE GLUCOSE: NEGATIVE
Urobilinogen, UA: 0.2 (ref 0.0–1.0)
pH: 7 (ref 5.0–8.0)

## 2014-01-31 LAB — TSH: TSH: 1.26 u[IU]/mL (ref 0.35–4.50)

## 2014-01-31 MED ORDER — EZETIMIBE 10 MG PO TABS: 10.0000 mg | ORAL_TABLET | Freq: Every day | ORAL | Status: DC

## 2014-01-31 MED ORDER — LUBIPROSTONE 24 MCG PO CAPS: 24.0000 ug | ORAL_CAPSULE | Freq: Two times a day (BID) | ORAL | Status: DC

## 2014-01-31 MED ORDER — CITALOPRAM HYDROBROMIDE 40 MG PO TABS
40.0000 mg | ORAL_TABLET | Freq: Every day | ORAL | Status: DC
Start: 2014-01-31 — End: 2015-04-28

## 2014-01-31 MED ORDER — AZITHROMYCIN 250 MG PO TABS: ORAL_TABLET | ORAL | Status: DC

## 2014-01-31 MED ORDER — TOLTERODINE TARTRATE ER 4 MG PO CP24: 4.0000 mg | ORAL_CAPSULE | Freq: Every day | ORAL | Status: DC

## 2014-01-31 MED ORDER — NORTRIPTYLINE HCL 75 MG PO CAPS: 75.0000 mg | ORAL_CAPSULE | Freq: Every day | ORAL | Status: DC

## 2014-01-31 MED ORDER — BENZONATATE 100 MG PO CAPS: 100.0000 mg | ORAL_CAPSULE | Freq: Two times a day (BID) | ORAL | Status: DC | PRN

## 2014-01-31 NOTE — Assessment & Plan Note (Signed)
Continue with current prescription therapy as reflected on the Med list.  

## 2014-01-31 NOTE — Assessment & Plan Note (Signed)
UA Labs Detrol LA qd

## 2014-01-31 NOTE — Progress Notes (Signed)
   Subjective:    HPI   C/o URI C/o urinary urgency  F/u L shoulder pain - worse w/ROM  The patient needs to address  chronic hypertension that has been well controlled with medicines; to address chronic  hyperlipidemia controlled with medicines as well - now on simvastatin due to LFT elevation; and to address insomnia  F/u rice and bread get stuck x long time (Has had it in the past) - PPI helped a lot  Wt Readings from Last 3 Encounters:  12/20/13 140 lb (63.504 kg)  07/10/12 140 lb (63.504 kg)   BP Readings from Last 3 Encounters:  01/31/14 110/80  12/20/13 105/72  11/22/13 102/70      Review of Systems  Constitutional: Negative for chills, activity change, appetite change, fatigue and unexpected weight change.  HENT: Negative for congestion, dental problem, ear pain, hearing loss, mouth sores, postnasal drip, sinus pressure, sneezing, sore throat and voice change.   Eyes: Negative for pain and visual disturbance.  Respiratory: Negative for chest tightness, wheezing and stridor.   Cardiovascular: Negative for leg swelling.  Gastrointestinal: Negative for blood in stool, abdominal distention and rectal pain.  Genitourinary: Negative for dysuria, hematuria, decreased urine volume, vaginal bleeding, vaginal discharge, difficulty urinating, vaginal pain and menstrual problem.  Musculoskeletal: Negative for joint swelling, gait problem and neck pain.  Skin: Negative for color change, rash and wound.  Neurological: Negative for tremors, syncope, speech difficulty and light-headedness.  Hematological: Negative for adenopathy.  Psychiatric/Behavioral: Positive for sleep disturbance. Negative for suicidal ideas, hallucinations, behavioral problems, confusion, dysphoric mood and decreased concentration. The patient is not hyperactive.        Objective:   Physical Exam  Constitutional: She appears well-developed. No distress.  HENT:  Head: Normocephalic.  Right Ear: External  ear normal.  Left Ear: External ear normal.  Nose: Nose normal.  Mouth/Throat: Oropharynx is clear and moist.  Eyes: Conjunctivae are normal. Pupils are equal, round, and reactive to light. Right eye exhibits no discharge. Left eye exhibits no discharge.  Neck: Normal range of motion. Neck supple. No JVD present. No tracheal deviation present. No thyromegaly present.  Cardiovascular: Normal rate, regular rhythm and normal heart sounds.   Pulmonary/Chest: No stridor. No respiratory distress. She has no wheezes.  Abdominal: Soft. Bowel sounds are normal. She exhibits no distension and no mass. There is no tenderness. There is no rebound and no guarding.  Musculoskeletal: She exhibits no edema or tenderness.  Lymphadenopathy:    She has no cervical adenopathy.  Neurological: She displays normal reflexes. No cranial nerve deficit. She exhibits normal muscle tone. Coordination normal.  Skin: No rash noted. No erythema.  Psychiatric: She has a normal mood and affect. Her behavior is normal. Judgment and thought content normal.  L shoulder is tender w/ROM  Lab Results  Component Value Date   WBC 4.5 07/05/2012   HGB 13.1 07/05/2012   HCT 39.1 07/05/2012   PLT 268.0 07/05/2012   GLUCOSE 97 01/24/2013   CHOL 182 12/31/2013   TRIG 153.0* 12/31/2013   HDL 60.50 12/31/2013   LDLDIRECT 135.5 01/24/2013   LDLCALC 91 12/31/2013   ALT 27 12/31/2013   AST 26 12/31/2013   NA 137 01/24/2013   K 3.9 01/24/2013   CL 100 01/24/2013   CREATININE 0.7 01/24/2013   BUN 17 01/24/2013   CO2 30 01/24/2013   TSH 2.00 07/05/2012           Assessment & Plan:

## 2014-02-05 ENCOUNTER — Ambulatory Visit (INDEPENDENT_AMBULATORY_CARE_PROVIDER_SITE_OTHER): Payer: Medicare Other | Admitting: Internal Medicine

## 2014-02-05 ENCOUNTER — Encounter: Payer: Self-pay | Admitting: Internal Medicine

## 2014-02-05 VITALS — BP 102/70 | HR 64 | Ht 63.0 in

## 2014-02-05 DIAGNOSIS — R1314 Dysphagia, pharyngoesophageal phase: Secondary | ICD-10-CM | POA: Diagnosis not present

## 2014-02-05 MED ORDER — PANTOPRAZOLE SODIUM 40 MG PO TBEC: 40.0000 mg | DELAYED_RELEASE_TABLET | Freq: Every day | ORAL | Status: DC

## 2014-02-05 NOTE — Progress Notes (Signed)
  Referred by Dr. Alain Marion Subjective:    Patient ID: Evelyn Murphy, female    DOB: [birth year], 68 y.o.   MRN: [id number]  HPI The patient is a nice lady and no from screening colonoscopy in 2011, who is complaining of recurrent dysphagia to solid foods. Rice, carrots, and sandwiches cause midsternal sticking point an impact dysphagia and is relieved by drinking water. It has been an intermittent problem for some time probably a couple years or longer. She notes that she takes Protonix is not a problem but for some reason she stops this at times. She has not had unintentional weight loss or bleeding or anything like that. GI review of systems is otherwise negative.Medications, allergies, past medical history, past surgical history, family history and social history are reviewed and updated in the EMR. Review of Systems Positive for back pain and some cough all other review of systems are negative.    Objective:   Physical Exam General:  Well-developed, well-nourished and in no acute distress Eyes:  anicteric. ENT:   Mouth and posterior pharynx free of lesions.  Neck:   supple w/o thyromegaly or mass.  Lungs: Clear to auscultation bilaterally. Heart:  S1S2, no rubs, murmurs, gallops. Abdomen:  soft, non-tender, no hepatosplenomegaly, hernia, or mass and BS+.  Lymph:  no cervical or supraclavicular adenopathy. Extremities:   no edema Skin   no rash. Neuro:  A&O x 3.  Psych:  appropriate mood and  Affect.       Assessment & Plan:  Dysphagia, pharyngoesophageal phase   Sounds like a GERD-related stricture. Chronic and intermittent without unintentinal weight loss and responds to PPI.  1. EGD/dilation The risks and benefits as well as alternatives of endoscopic procedure(s) have been discussed and reviewed. All questions answered. The patient agrees to proceed. 2. Restart and stay on PPI - educated about how this treats GERD and prevents recurrent dysphagia  I appreciate the  opportunity to care for this patient. NO:TRRN Plotnikov, MD

## 2014-02-05 NOTE — Patient Instructions (Signed)
You have been scheduled for an endoscopy. Please follow the written instructions given to you at your visit today. Please pick up your prep at the pharmacy within the next 1-3 days. If you use inhalers (even only as needed), please bring them with you on the day of your procedure. Your physician has requested that you go to www.startemmi.com and enter the access code given to you at your visit today. This web site gives a general overview about your procedure. However, you should still follow specific instructions given to you by our office regarding your preparation for the procedure.  We are giving you a printed rx for generic protonix.   I appreciate the opportunity to care for you.

## 2014-02-06 ENCOUNTER — Other Ambulatory Visit: Payer: Self-pay | Admitting: Internal Medicine

## 2014-02-06 ENCOUNTER — Other Ambulatory Visit: Payer: Self-pay | Admitting: Cardiovascular Disease

## 2014-02-07 ENCOUNTER — Other Ambulatory Visit: Payer: Self-pay | Admitting: Internal Medicine

## 2014-02-09 ENCOUNTER — Other Ambulatory Visit: Payer: Self-pay | Admitting: Internal Medicine

## 2014-02-11 NOTE — Telephone Encounter (Signed)
Notified pharmacy spoke with [name]/pharmacist gave md approval for zolpidem...[name]

## 2014-02-18 MED ORDER — ZOLPIDEM TARTRATE 10 MG PO TABS: 10.0000 mg | ORAL_TABLET | Freq: Every day | ORAL | Status: DC

## 2014-02-18 NOTE — Addendum Note (Signed)
Addended by: Lowella Dandy on: 02/18/2014 11:56 AM   Modules accepted: Orders

## 2014-03-18 ENCOUNTER — Other Ambulatory Visit (INDEPENDENT_AMBULATORY_CARE_PROVIDER_SITE_OTHER): Payer: Medicare Other

## 2014-03-18 ENCOUNTER — Other Ambulatory Visit: Payer: Medicare Other

## 2014-03-18 DIAGNOSIS — E785 Hyperlipidemia, unspecified: Secondary | ICD-10-CM

## 2014-03-18 LAB — HEPATIC FUNCTION PANEL
ALBUMIN: 4.5 g/dL (ref 3.5–5.2)
ALK PHOS: 121 U/L — AB (ref 39–117)
ALT: 27 U/L (ref 0–35)
AST: 21 U/L (ref 0–37)
BILIRUBIN DIRECT: 0.1 mg/dL (ref 0.0–0.3)
TOTAL PROTEIN: 8 g/dL (ref 6.0–8.3)
Total Bilirubin: 0.3 mg/dL (ref 0.2–1.2)

## 2014-03-20 ENCOUNTER — Encounter: Payer: Self-pay | Admitting: Cardiovascular Disease

## 2014-03-20 ENCOUNTER — Ambulatory Visit (INDEPENDENT_AMBULATORY_CARE_PROVIDER_SITE_OTHER): Payer: Medicare Other | Admitting: Cardiovascular Disease

## 2014-03-20 VITALS — BP 110/74 | HR 76 | Ht 63.0 in

## 2014-03-20 DIAGNOSIS — E785 Hyperlipidemia, unspecified: Secondary | ICD-10-CM

## 2014-03-20 LAB — NMR LIPOPROFILE WITH LIPIDS
Cholesterol, Total: 181 mg/dL (ref 100–199)
HDL Particle Number: 44.3 umol/L (ref >=30.5)
HDL SIZE: 9.3 nm (ref >=9.2)
HDL-C: 67 mg/dL (ref >39)
LARGE VLDL-P: 7.6 nmol/L — AB (ref <=2.7)
LDL (calc): 82 mg/dL (ref 0–99)
LDL PARTICLE NUMBER: 1233 nmol/L — AB (ref <1000)
LDL Size: 21.3 nm (ref >=20.8)
LP-IR Score: 44 (ref <=45)
Large HDL-P: 9.3 umol/L (ref >=4.8)
Small LDL Particle Number: 501 nmol/L (ref <=527)
Triglycerides: 160 mg/dL — ABNORMAL HIGH (ref 0–149)
VLDL SIZE: 51.7 nm — AB (ref <=46.6)

## 2014-03-20 NOTE — Assessment & Plan Note (Signed)
Evelyn Murphy is doing well.   Her LDL particle number has decreased from 2041 to 1233 on Simvastatin and Zetia. She is to continue to work on a diet and exercise program.

## 2014-03-20 NOTE — Progress Notes (Signed)
[name] Date of Birth  Sep 24, 1945       [facility]    [employer] [address], [place], [place]  [address]   [place], [place]  [address] [phone]     [phone]   Fax  [phone]    Fax [phone]  Problem List: 1. Strong family history of coronary artery disease 2. Hyperlipidemia  History of Present Illness:  Keelee is a 68 yo with family hx of CAD ( father died at 12 of MI, mother had MI at 2, CABG x 2, brother has had multiple stents).  Both aunts diet of heart disease.  She is concerned about her risks.   She denies any CP or dyspnea.  She exercises every day - 45 minutes on treadmill, 40 minutes on stationary bike,.  She also adds weights 3-4 days a week.    She has eats a low fat diet,   Dec. 30, 2015:  Mallika is a 68 yo with hx o hyperlipidemia and a strong family hx of CAD Her LDL partical number has decreased from 200 to 1233.   Current Outpatient Prescriptions on File Prior to Visit  Medication Sig Dispense Refill  . citalopram (CELEXA) 40 MG tablet Take 1 tablet (40 mg total) by mouth daily. 90 tablet 3  . ezetimibe (ZETIA) 10 MG tablet Take 1 tablet (10 mg total) by mouth daily. (Patient taking differently: Take 10 mg by mouth daily. Pt states that she takes a half tablet daily.) 100 tablet 3  . nortriptyline (PAMELOR) 75 MG capsule Take 1 capsule (75 mg total) by mouth at bedtime. 90 capsule 3  . pantoprazole (PROTONIX) 40 MG tablet Take 1 tablet (40 mg total) by mouth daily. 90 tablet 3  . simvastatin (ZOCOR) 40 MG tablet TAKE ONE TABLET BY MOUTH NIGHTLY AT BEDTIME  90 tablet 0  . zolpidem (AMBIEN) 10 MG tablet Take 1 tablet (10 mg total) by mouth at bedtime. (Patient taking differently: Take 10 mg by mouth at bedtime. Pt states that she takes a half tablet at bedtime.) 90 tablet 1   No current facility-administered medications on file prior to visit.    Allergies  Allergen Reactions  .  Linzess [Linaclotide]     Too strong  . Lipitor [Atorvastatin]     elev LFTs    Past Medical History  Diagnosis Date  . Hyperlipidemia   . Anxiety   . Insomnia   . RBBB     Past Surgical History  Procedure Laterality Date  . Shoulder arthroscopy  10/2011    R Dr Meyer Cory  . Colonoscopy      History  Smoking status  . Never Smoker   Smokeless tobacco  . Never Used    History  Alcohol Use No    Family History  Problem Relation Age of Onset  . Heart disease Mother 82    CABG  . Diabetes Mother   . Heart disease Father     Father died of MI at 31  . COPD Father   . Heart disease Brother   . Diabetes Brother     Reviw of Systems:  Reviewed in the HPI.  All other systems are negative.  Physical Exam: Blood pressure 110/74, pulse 76, height 5\' 3"  (1.6 m), weight 0 lb (0 kg). General: Well developed, well nourished, in no acute distress.  Head: Normocephalic, atraumatic, sclera non-icteric, mucus membranes are moist,  Neck: Supple. Carotids are 2 + without bruits. No JVD   Lungs: Clear   Heart: RR, normla S1, S2  Abdomen: Soft, non-tender, non-distended with normal bowel sounds.  Msk:  Strength and tone are normal   Extremities: No clubbing or cyanosis. No edema.  Distal pedal pulses are 2+ and equal    Neuro: CN II - XII intact.  Alert and oriented X 3.   Psych:  Normal   ECG: April 2014:  NSR, RBBB.    Assessment / Plan:

## 2014-03-20 NOTE — Patient Instructions (Signed)
Your physician recommends that you continue on your current medications as directed. Please refer to the Current Medication list given to you today.  Your physician recommends that you return for lab work in: 6 months. You will need to FAST for this appointment - nothing to eat or drink after midnight the night before except water.  Your physician wants you to follow-up in: 1 year with Dr. Acie Fredrickson.  You will receive a reminder letter in the mail two months in advance. If you don't receive a letter, please call our office to schedule the follow-up appointment. Your physician recommends that you return for lab work in: 1 year on the day of or a few days before your office visit with Dr. Acie Fredrickson.  You will need to FAST for this appointment - nothing to eat or drink after midnight the night before except water.

## 2014-03-22 DIAGNOSIS — R410 Disorientation, unspecified: Secondary | ICD-10-CM

## 2014-03-22 HISTORY — DX: Disorientation, unspecified: R41.0

## 2014-03-26 DIAGNOSIS — M7752 Other enthesopathy of left foot: Secondary | ICD-10-CM | POA: Diagnosis not present

## 2014-03-26 DIAGNOSIS — L602 Onychogryphosis: Secondary | ICD-10-CM | POA: Diagnosis not present

## 2014-03-29 ENCOUNTER — Encounter: Payer: Medicare Other | Admitting: Internal Medicine

## 2014-05-03 ENCOUNTER — Encounter: Payer: Medicare Other | Admitting: Internal Medicine

## 2014-05-11 ENCOUNTER — Other Ambulatory Visit: Payer: Self-pay | Admitting: Internal Medicine

## 2014-05-11 ENCOUNTER — Other Ambulatory Visit: Payer: Self-pay | Admitting: Cardiovascular Disease

## 2014-06-07 DIAGNOSIS — Z6825 Body mass index (BMI) 25.0-25.9, adult: Secondary | ICD-10-CM | POA: Diagnosis not present

## 2014-06-07 DIAGNOSIS — N958 Other specified menopausal and perimenopausal disorders: Secondary | ICD-10-CM | POA: Diagnosis not present

## 2014-06-07 DIAGNOSIS — M8588 Other specified disorders of bone density and structure, other site: Secondary | ICD-10-CM | POA: Diagnosis not present

## 2014-06-07 DIAGNOSIS — Z01419 Encounter for gynecological examination (general) (routine) without abnormal findings: Secondary | ICD-10-CM | POA: Diagnosis not present

## 2014-06-07 DIAGNOSIS — Z1231 Encounter for screening mammogram for malignant neoplasm of breast: Secondary | ICD-10-CM | POA: Diagnosis not present

## 2014-06-25 DIAGNOSIS — M8580 Other specified disorders of bone density and structure, unspecified site: Secondary | ICD-10-CM | POA: Diagnosis not present

## 2014-06-29 ENCOUNTER — Emergency Department (HOSPITAL_COMMUNITY): Payer: Medicare Other

## 2014-06-29 ENCOUNTER — Observation Stay (HOSPITAL_COMMUNITY)
Admission: EM | Admit: 2014-06-29 | Discharge: 2014-07-01 | Disposition: A | Discharge status: Home / Self Care | Payer: Medicare Other | Attending: Internal Medicine | Admitting: Internal Medicine

## 2014-06-29 ENCOUNTER — Encounter (HOSPITAL_COMMUNITY): Payer: Self-pay | Admitting: Emergency Medicine

## 2014-06-29 DIAGNOSIS — R410 Disorientation, unspecified: Secondary | ICD-10-CM

## 2014-06-29 DIAGNOSIS — E785 Hyperlipidemia, unspecified: Secondary | ICD-10-CM | POA: Diagnosis not present

## 2014-06-29 DIAGNOSIS — F418 Other specified anxiety disorders: Secondary | ICD-10-CM

## 2014-06-29 DIAGNOSIS — G459 Transient cerebral ischemic attack, unspecified: Secondary | ICD-10-CM | POA: Diagnosis not present

## 2014-06-29 DIAGNOSIS — Z7982 Long term (current) use of aspirin: Secondary | ICD-10-CM | POA: Diagnosis not present

## 2014-06-29 DIAGNOSIS — R269 Unspecified abnormalities of gait and mobility: Secondary | ICD-10-CM | POA: Insufficient documentation

## 2014-06-29 DIAGNOSIS — R2689 Other abnormalities of gait and mobility: Secondary | ICD-10-CM | POA: Diagnosis not present

## 2014-06-29 DIAGNOSIS — R4781 Slurred speech: Principal | ICD-10-CM | POA: Insufficient documentation

## 2014-06-29 DIAGNOSIS — J4520 Mild intermittent asthma, uncomplicated: Secondary | ICD-10-CM

## 2014-06-29 DIAGNOSIS — Z79899 Other long term (current) drug therapy: Secondary | ICD-10-CM | POA: Insufficient documentation

## 2014-06-29 DIAGNOSIS — K219 Gastro-esophageal reflux disease without esophagitis: Secondary | ICD-10-CM

## 2014-06-29 DIAGNOSIS — R531 Weakness: Secondary | ICD-10-CM | POA: Diagnosis not present

## 2014-06-29 DIAGNOSIS — J45909 Unspecified asthma, uncomplicated: Secondary | ICD-10-CM | POA: Diagnosis present

## 2014-06-29 DIAGNOSIS — R4182 Altered mental status, unspecified: Secondary | ICD-10-CM | POA: Insufficient documentation

## 2014-06-29 DIAGNOSIS — Z888 Allergy status to other drugs, medicaments and biological substances status: Secondary | ICD-10-CM | POA: Insufficient documentation

## 2014-06-29 DIAGNOSIS — R2681 Unsteadiness on feet: Secondary | ICD-10-CM | POA: Diagnosis present

## 2014-06-29 DIAGNOSIS — Z8673 Personal history of transient ischemic attack (TIA), and cerebral infarction without residual deficits: Secondary | ICD-10-CM | POA: Insufficient documentation

## 2014-06-29 LAB — I-STAT TROPONIN, ED: Troponin i, poc: 0 ng/mL (ref 0.00–0.08)

## 2014-06-29 LAB — DIFFERENTIAL
Basophils Absolute: 0 10*3/uL (ref 0.0–0.1)
Basophils Relative: 0 % (ref 0–1)
Eosinophils Absolute: 0 10*3/uL (ref 0.0–0.7)
Eosinophils Relative: 0 % (ref 0–5)
Lymphocytes Relative: 11 % — ABNORMAL LOW (ref 12–46)
Lymphs Abs: 0.8 10*3/uL (ref 0.7–4.0)
Monocytes Absolute: 0.5 10*3/uL (ref 0.1–1.0)
Monocytes Relative: 6 % (ref 3–12)
Neutro Abs: 6 10*3/uL (ref 1.7–7.7)
Neutrophils Relative %: 83 % — ABNORMAL HIGH (ref 43–77)

## 2014-06-29 LAB — COMPREHENSIVE METABOLIC PANEL
ALT: 30 U/L (ref 0–35)
AST: 31 U/L (ref 0–37)
Albumin: 4.2 g/dL (ref 3.5–5.2)
Alkaline Phosphatase: 111 U/L (ref 39–117)
Anion gap: 12 (ref 5–15)
BUN: 16 mg/dL (ref 6–23)
CO2: 27 mmol/L (ref 19–32)
Calcium: 9.6 mg/dL (ref 8.4–10.5)
Chloride: 98 mmol/L (ref 96–112)
Creatinine, Ser: 0.79 mg/dL (ref 0.50–1.10)
GFR calc Af Amer: >90 mL/min (ref >90)
GFR calc non Af Amer: 85 mL/min — ABNORMAL LOW (ref >90)
Glucose, Bld: 130 mg/dL — ABNORMAL HIGH (ref 70–99)
Potassium: 3.6 mmol/L (ref 3.5–5.1)
Sodium: 137 mmol/L (ref 135–145)
Total Bilirubin: 0.3 mg/dL (ref 0.3–1.2)
Total Protein: 7.4 g/dL (ref 6.0–8.3)

## 2014-06-29 LAB — URINALYSIS, ROUTINE W REFLEX MICROSCOPIC
Bilirubin Urine: NEGATIVE
Glucose, UA: NEGATIVE mg/dL
Hgb urine dipstick: NEGATIVE
Ketones, ur: NEGATIVE mg/dL
Leukocytes, UA: NEGATIVE
Nitrite: NEGATIVE
Protein, ur: NEGATIVE mg/dL
Specific Gravity, Urine: 1.011 (ref 1.005–1.030)
Urobilinogen, UA: 0.2 mg/dL (ref 0.0–1.0)
pH: 6.5 (ref 5.0–8.0)

## 2014-06-29 LAB — CBC
HCT: 39.7 % (ref 36.0–46.0)
Hemoglobin: 13.1 g/dL (ref 12.0–15.0)
MCH: 29.3 pg (ref 26.0–34.0)
MCHC: 33 g/dL (ref 30.0–36.0)
MCV: 88.8 fL (ref 78.0–100.0)
Platelets: 211 10*3/uL (ref 150–400)
RBC: 4.47 MIL/uL (ref 3.87–5.11)
RDW: 11.9 % (ref 11.5–15.5)
WBC: 7.2 10*3/uL (ref 4.0–10.5)

## 2014-06-29 LAB — ETHANOL: Alcohol, Ethyl (B): <5 mg/dL (ref 0–9)

## 2014-06-29 LAB — I-STAT CG4 LACTIC ACID, ED: Lactic Acid, Venous: 1.26 mmol/L (ref 0.5–2.0)

## 2014-06-29 LAB — RAPID URINE DRUG SCREEN, HOSP PERFORMED
Amphetamines: NOT DETECTED
Barbiturates: NOT DETECTED
Benzodiazepines: NOT DETECTED
Cocaine: NOT DETECTED
Opiates: NOT DETECTED
Tetrahydrocannabinol: NOT DETECTED

## 2014-06-29 LAB — TSH: TSH: 2.014 u[IU]/mL (ref 0.350–4.500)

## 2014-06-29 LAB — PROTIME-INR
INR: 0.96 (ref 0.00–1.49)
Prothrombin Time: 12.9 seconds (ref 11.6–15.2)

## 2014-06-29 LAB — APTT: aPTT: 22 seconds — ABNORMAL LOW (ref 24–37)

## 2014-06-29 LAB — GLUCOSE, CAPILLARY
GLUCOSE-CAPILLARY: 74 mg/dL (ref 70–99)
Glucose-Capillary: 110 mg/dL — ABNORMAL HIGH (ref 70–99)

## 2014-06-29 LAB — CBG MONITORING, ED: Glucose-Capillary: 116 mg/dL — ABNORMAL HIGH (ref 70–99)

## 2014-06-29 MED ORDER — NORTRIPTYLINE HCL 25 MG PO CAPS
75.0000 mg | ORAL_CAPSULE | Freq: Every day | ORAL | Status: DC
  Administered 2014-06-30: 75 mg via ORAL
  Filled 2014-06-29 (×2): qty 3

## 2014-06-29 MED ORDER — STROKE: EARLY STAGES OF RECOVERY BOOK
Freq: Once | Status: DC
  Filled 2014-06-29: qty 1

## 2014-06-29 MED ORDER — CITALOPRAM HYDROBROMIDE 20 MG PO TABS
40.0000 mg | ORAL_TABLET | Freq: Every day | ORAL | Status: DC
  Administered 2014-06-30 – 2014-07-01 (×2): 40 mg via ORAL
  Filled 2014-06-29 (×2): qty 2

## 2014-06-29 MED ORDER — SODIUM CHLORIDE 0.9 % IV SOLN
INTRAVENOUS | Status: AC
  Administered 2014-06-29: 17:00:00 via INTRAVENOUS

## 2014-06-29 MED ORDER — PANTOPRAZOLE SODIUM 40 MG PO TBEC
40.0000 mg | DELAYED_RELEASE_TABLET | Freq: Every day | ORAL | Status: DC
  Administered 2014-06-30 – 2014-07-01 (×2): 40 mg via ORAL
  Filled 2014-06-29 (×2): qty 1

## 2014-06-29 MED ORDER — HEPARIN SODIUM (PORCINE) 5000 UNIT/ML IJ SOLN
5000.0000 [IU] | Freq: Three times a day (TID) | INTRAMUSCULAR | Status: DC
  Administered 2014-06-29 – 2014-07-01 (×5): 5000 [IU] via SUBCUTANEOUS
  Filled 2014-06-29 (×5): qty 1

## 2014-06-29 MED ORDER — EZETIMIBE 10 MG PO TABS
10.0000 mg | ORAL_TABLET | Freq: Every day | ORAL | Status: DC
  Administered 2014-06-30 – 2014-07-01 (×2): 10 mg via ORAL
  Filled 2014-06-29 (×2): qty 1

## 2014-06-29 MED ORDER — ACETAMINOPHEN 325 MG PO TABS: 650.0000 mg | ORAL_TABLET | ORAL | Status: DC | PRN

## 2014-06-29 MED ORDER — SIMVASTATIN 40 MG PO TABS
40.0000 mg | ORAL_TABLET | Freq: Every day | ORAL | Status: DC
  Administered 2014-06-30: 40 mg via ORAL
  Filled 2014-06-29: qty 1

## 2014-06-29 MED ORDER — ALBUTEROL SULFATE (2.5 MG/3ML) 0.083% IN NEBU: 2.5000 mg | INHALATION_SOLUTION | Freq: Four times a day (QID) | RESPIRATORY_TRACT | Status: DC | PRN

## 2014-06-29 MED ORDER — ASPIRIN 325 MG PO TABS
325.0000 mg | ORAL_TABLET | Freq: Every day | ORAL | Status: DC
  Administered 2014-06-30 – 2014-07-01 (×2): 325 mg via ORAL
  Filled 2014-06-29 (×2): qty 1

## 2014-06-29 NOTE — ED Notes (Signed)
In and Out catheter completed by [facility], NT+3, Along with assistance of Deborha Payment, NT +3

## 2014-06-29 NOTE — H&P (Signed)
[facility] History and Physical  Evelyn Murphy YHC:[id number] DOB: 12/18/1945 DOA: 06/29/2014  Referring physician: Dalia Heading; PA PCP: Walker Kehr, MD   Chief Complaint: Unsteady gait, slurred speech and confusion (slower to find the right words and missing the month)  HPI: Evelyn Murphy is a 69 y.o. female with past medical history significant for hyperlipidemia, depression/anxiety, insomnia and right bundle bunch block; who presented to the ED secondary to slight confusion, unsteady gait and a slower speech. Patient was last seen normal around 10 PM the night prior to admission. Patient woke up with abnormal symptoms, there has not been any alleviated or aggravating factors. Of note she was not taking any aspirin prior to admission. Patient denies any palpitations, chest pain, shortness of breath, nausea, vomiting, hematuria, dysuria, melena, hematemesis or any other complaints. Hospital report that her symptoms has been gradually improving since she has been in the emergency department. A CT scan of the head was done and was negative for any intracranial abnormalities. Patient has also received an MRI (technically difficult due to patient movement and complaints of claustrophobia) which demonstrated a potential small acute infarct within the posterior limb of the right internal capsule (radiologist is more inclined for this defect to be an artifact than a really true stroke). Neurology has been consulted and [facility] call to admit the patient for further evaluation and treatment of TIA versus stroke.   Review of Systems:  Patient with recent upper respiratory infection symptoms (according to her, they have now subsided). Negative otherwise except as mentioned in history of present illness  Past Medical History  Diagnosis Date  . Hyperlipidemia   . Anxiety   . Insomnia   . RBBB    Past Surgical History  Procedure Laterality Date  . Shoulder arthroscopy  10/2011      R Dr Meyer Cory  . Colonoscopy     Social History:  reports that she has never smoked. She has never used smokeless tobacco. She reports that she does not drink alcohol or use illicit drugs.  Allergies  Allergen Reactions  . Linzess [Linaclotide]     Too strong  . Lipitor [Atorvastatin]     elev LFTs    Family History  Problem Relation Age of Onset  . Heart disease Mother 74    CABG  . Diabetes Mother   . Heart disease Father     Father died of MI at 32  . COPD Father   . Heart disease Brother   . Diabetes Brother     Prior to Admission medications   Medication Sig Start Date End Date Taking? Authorizing Provider  citalopram (CELEXA) 40 MG tablet Take 1 tablet (40 mg total) by mouth daily. 01/31/14  Yes Aleksei Plotnikov V, MD  ezetimibe (ZETIA) 10 MG tablet Take 1 tablet (10 mg total) by mouth daily. Patient taking differently: Take 10 mg by mouth daily. Pt states that she takes a half tablet daily. 01/31/14  Yes Aleksei Plotnikov V, MD  nortriptyline (PAMELOR) 75 MG capsule Take 1 capsule (75 mg total) by mouth at bedtime. 01/31/14  Yes Aleksei Plotnikov V, MD  pantoprazole (PROTONIX) 40 MG tablet Take 1 tablet (40 mg total) by mouth daily. 02/05/14  Yes Gatha Mayer, MD  simvastatin (ZOCOR) 40 MG tablet TAKE ONE TABLET BY MOUTH AT BEDTIME  05/13/14  Yes Thayer Headings, MD  zolpidem (AMBIEN) 10 MG tablet Take 1 tablet (10 mg total) by mouth at bedtime. Patient taking differently: Take  10 mg by mouth at bedtime. Pt states that she takes a half tablet at bedtime. 02/18/14  Yes Cassandria Anger, MD   Physical Exam: Filed Vitals:   06/29/14 1252 06/29/14 1300 06/29/14 1420 06/29/14 1446  BP:  112/65 101/67   Pulse:  75 77   Temp: 95.6 F (35.3 C)  98.1 F (36.7 C) 98.1 F (36.7 C)  TempSrc: Rectal  Oral   Resp:  18 16   SpO2:  95% 97%     Wt Readings from Last 3 Encounters:  12/20/13 63.504 kg (140 lb)  07/10/12 63.504 kg (140 lb)    General:  Appears calm  and comfortable; denies chest pain, palpitations and shortness of breath. Patient with significant improvement in mentation and dysarthria according to family members at bedside. Still able to catch a little bit of a slurred speech with certain words. Eyes: PERRL, normal lids, irises & conjunctiva; no nystagmus, no icterus ENT: grossly normal hearing, lips & tongue; no erythema, no exudates, no thrush; no drainage out of her nostrils or ears Neck: no LAD, masses or thyromegaly; no JVD Cardiovascular: RRR, no m/r/g. No LE edema. Telemetry: SR and no arrhythmias appreciated on telemetry in the ED Respiratory: CTA bilaterally, no w/r/r. Normal respiratory effort. Abdomen: soft, nt, nd; positive bowel sounds Skin: no rash or induration seen on limited exam Musculoskeletal: grossly normal tone BUE/BLE, full range of motion Psychiatric: grossly normal mood and affect, speech fluent and appropriate Neurologic: Alert, awake and oriented 3, cranial nerve II-12 grossly intact; uvula was central and there was no tongue deviation; muscle strength 5 out of 5 bilaterally symmetrically and normal but a slow finger to nose/heel to shin bilaterally           Labs on Admission:  Basic Metabolic Panel:  Recent Labs Lab 06/29/14 1023  NA 137  K 3.6  CL 98  CO2 27  GLUCOSE 130*  BUN 16  CREATININE 0.79  CALCIUM 9.6   Liver Function Tests:  Recent Labs Lab 06/29/14 1023  AST 31  ALT 30  ALKPHOS 111  BILITOT 0.3  PROT 7.4  ALBUMIN 4.2   CBC:  Recent Labs Lab 06/29/14 1023  WBC 7.2  NEUTROABS 6.0  HGB 13.1  HCT 39.7  MCV 88.8  PLT 211   CBG:  Recent Labs Lab 06/29/14 1009  GLUCAP 116*    Radiological Exams on Admission: Dg Chest 2 View  06/29/2014   CLINICAL DATA:  Altered mental status  EXAM: CHEST  2 VIEW  COMPARISON:  11/22/2013  FINDINGS: Cardiomediastinal silhouette is stable. No acute infiltrate or pleural effusion. No pulmonary edema. Mild degenerative changes thoracic  spine.  IMPRESSION: No active cardiopulmonary disease.   Electronically Signed   By: Lahoma Crocker M.D.   On: 06/29/2014 11:07   Ct Head (brain) Wo Contrast  06/29/2014   CLINICAL DATA:  Last seen normal at 2220 last evening, now with slurred speech, delayed thought process and unsteady gait.  EXAM: CT HEAD WITHOUT CONTRAST  TECHNIQUE: Contiguous axial images were obtained from the base of the skull through the vertex without intravenous contrast.  COMPARISON:  None.  FINDINGS: Gray-white differentiation is maintained. No CT evidence of acute large territory infarct. No intraparenchymal or extra-axial mass or hemorrhage. Normal size and configuration of the ventricles and basilar cisterns. No midline shift. There is minimal opacification of left anterior ethmoidal air cells. The remaining paranasal sinuses and mastoid air cells are normally aerated. No air-fluid levels. Regional soft  tissues appear normal. No displaced calvarial fracture. Regional soft tissues appear normal.  IMPRESSION: Negative noncontrast head CT.   Electronically Signed   By: Sandi Mariscal M.D.   On: 06/29/2014 10:37   Mr Brain Wo Contrast (neuro Protocol)  06/29/2014   CLINICAL DATA:  70 year old female with hyperlipidemia presenting with acute onset of slurred speech this morning. Unsteady gait and slight confusion with weakness. Initial encounter.  EXAM: MRI HEAD WITHOUT CONTRAST  TECHNIQUE: Multiplanar, multiecho pulse sequences of the brain and surrounding structures were obtained without intravenous contrast.  COMPARISON:  06/29/2014 head CT.  No comparison brain MR.  FINDINGS: Patient was moving throughout the examination and fast technique imaging had to be utilized.  No definitive acute infarct identified. One sequence raises possibly of a small acute infarct within the posterior limb of the right internal capsule but is not confirmed on other sequences and I suspect does not represent an acute infarct. Clinical correlation recommended.   Very mild small vessel disease.  No intracranial hemorrhage.  No hydrocephalus.  No intracranial mass lesion noted on this unenhanced exam.  Major intracranial vascular structures are patent.  Mucosal thickening paranasal sinuses most notable anterior left ethmoid sinus air cell junction with the left frontal sinus.  Cervical medullary junction, pituitary region, pineal region and orbital structures unremarkable.  IMPRESSION: Patient was moving throughout the examination and fast technique imaging had to be utilized.  No definitive acute infarct identified. One sequence raises possibly of a small acute infarct within the posterior limb of the right internal capsule but is not confirmed on other sequences and I suspect does not represent an acute infarct. Clinical correlation recommended.  Mucosal thickening paranasal sinuses most notable anterior left ethmoid sinus air cell junction with the left frontal sinus.   Electronically Signed   By: Genia Del M.D.   On: 06/29/2014 14:21    EKG:  No acute ischemic changes. Sinus rhythm and positive old right bundle branch block   Assessment/Plan 1-Slurred speech, unsteady gait and confusion: Patient's symptoms presumably secondary to TIA. Patient was not on aspirin before this admission. CT head negative for acute intracranial abnormalities; MRI w/o contrast with possible are of small acute infarct, but given poor technicality in the study radiologist feel is an artifact. She was last seen normal around 10 PM the night prior to admission. -will admit to telemetry bed -will complete TIA work up with 2-D echo, carotid dopplers and will check TSH, B12, A1c, lipid panel and RPR -PT/OT has been contacted to assess patient ability to performed ADLs -will use ASA for secondary prevention -Neurology on board and will rec's -Patient risk factors for stroke includes: age, hyperlipidemia -patient reports having a recent lower respiratory tract infection and has been  using over-the-counter TheraFlu along with chronic ambien and nortriptyline (?? Medication related)  2-Hyperlipidemia: Will check lipid panel -Continue Zocor and zetia  3-Depression with anxiety: Mood appears to be stable. -No suicidal ideation or hallucinations -Will continue the use of Celexa and Pamelor  4-Asthma: Chronic and is stable. Patient cannot remember when was the last time that she use her albuterol inhaler. -Currently without any wheezing. -Will monitor and use PRN albuterol if needed  5-GERD (gastroesophageal reflux disease): Will continue the use of Protonix   Neurology (Dr. Katherine Roan)  Code Status: Full DVT Prophylaxis:heparin Family Communication: husband at bedside Disposition Plan: observation, telemetry bed; LOS < 2 midnights  Time spent: 78 minutes  Barton Dubois [facility] Pager [phone]

## 2014-06-29 NOTE — Consult Note (Signed)
Referring Physician: Dr Dyann Kief    Chief Complaint: Slurred speech  HPI: Evelyn Murphy is a 69 y.o. female with a history of hyperlipidemia, anxiety, and recent lower respiratory tract infection who was brought to the emergency department today by her family over concerns for generalized weakness, slurred speech, and slowed mentation. The patient has had a recent lower respiratory tract infection with nonproductive cough. She's been spending a lot of time in bed and taking TheraFlu as well as Ambien which she takes for insomnia. Today she had difficulty getting out of bed due to generalized weakness. She had a fall but denies injury. Later her husband noticed that she was downstairs sitting in a chair but was poorly responsive and mildly confused. When she stood she seemed to be unsteady on her feet although when he checked her grip strength it was equal bilaterally. He did notice any facial droop however her speech was somewhat slurred. She was brought to the emergency department where she had a head CT and an MRI (see below). The patient and her family family feel she is back to baseline now.  Also of note earlier in the week the patient was out with some friends having a discussion. All of a sudden she developed difficulty with word finding and expressing herself. This episode lasted approximately 5 minutes. She will be admitted for further stroke/TIA workup.  Date last known well: Date: 06/28/2014 Time last known well: Time: 22:00 tPA Given: No: The patient is out of the window for treatment and her deficits have resolved.  Past Medical History  Diagnosis Date  . Hyperlipidemia   . Anxiety   . Insomnia   . RBBB     Past Surgical History  Procedure Laterality Date  . Shoulder arthroscopy  10/2011    R Dr Meyer Cory  . Colonoscopy      Family History  Problem Relation Age of Onset  . Heart disease Mother 10    CABG  . Diabetes Mother   . Heart disease Father     Father died of MI at 70   . COPD Father   . Heart disease Brother   . Diabetes Brother   No family history of strokes  Social History:  reports that she has never smoked. She has never used smokeless tobacco. She reports that she does not drink alcohol or use illicit drugs. the patient is married. She has 2 children and 3 grandchildren. 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  Allergies:  Allergies  Allergen Reactions  . Linzess [Linaclotide]     Too strong  . Lipitor [Atorvastatin]     elev LFTs    Medications:  Current Facility-Administered Medications  Medication Dose Route Frequency Provider Last Rate Last Dose  .  stroke: mapping our early stages of recovery book   Does not apply Once Barton Dubois, MD      . 0.9 %  sodium chloride infusion   Intravenous Continuous Barton Dubois, MD 50 mL/hr at 06/29/14 1716    . acetaminophen (TYLENOL) tablet 650 mg  650 mg Oral Q4H PRN Barton Dubois, MD      . albuterol (PROVENTIL) (2.5 MG/3ML) 0.083% nebulizer solution 2.5 mg  2.5 mg Nebulization Q6H PRN Barton Dubois, MD      . Derrill Memo ON 06/30/2014] aspirin tablet 325 mg  325 mg Oral Daily Barton Dubois, MD      . Derrill Memo ON 06/30/2014] citalopram (CELEXA) tablet 40 mg  40 mg Oral  Daily Barton Dubois, MD      . Derrill Memo ON 06/30/2014] ezetimibe (ZETIA) tablet 10 mg  10 mg Oral Daily Barton Dubois, MD      . heparin injection 5,000 Units  5,000 Units Subcutaneous 3 times per day Barton Dubois, MD      . Derrill Memo ON 06/30/2014] nortriptyline (PAMELOR) capsule 75 mg  75 mg Oral QHS Barton Dubois, MD      . Derrill Memo ON 06/30/2014] pantoprazole (PROTONIX) EC tablet 40 mg  40 mg Oral Daily Barton Dubois, MD      . Derrill Memo ON 06/30/2014] simvastatin (ZOCOR) tablet 40 mg  40 mg Oral QHS Barton Dubois, MD        ROS: History obtained from the patient  General ROS: negative for - chills, fever, night sweats, weight gain or weight loss. Positive for fatigue and poor appetite. Psychological ROS: negative  for - behavioral disorder, hallucinations, memory difficulties, mood swings or suicidal ideation Ophthalmic ROS: negative for - blurry vision, double vision, eye pain or loss of vision ENT ROS: negative for - epistaxis, nasal discharge, oral lesions, sore throat, tinnitus or vertigo Allergy and Immunology ROS: negative for - hives or itchy/watery eyes Hematological and Lymphatic ROS: negative for - bleeding problems, bruising or swollen lymph nodes Endocrine ROS: negative for - galactorrhea, hair pattern changes, polydipsia/polyuria or temperature intolerance Respiratory ROS: negative for - hemoptysis, shortness of breath or wheezing. Positive for a nonproductive cough. Cardiovascular ROS: negative for - chest pain, dyspnea on exertion, edema or irregular heartbeat Gastrointestinal ROS: negative for - abdominal pain, diarrhea, hematemesis, nausea/vomiting or stool incontinence Genito-Urinary ROS: negative for - dysuria, hematuria, incontinence or urinary frequency/urgency Musculoskeletal ROS: negative for - joint swelling or muscular weakness Neurological ROS: as noted in HPI Dermatological ROS: negative for rash and skin lesion changes   Physical Examination: Blood pressure 126/69, pulse 71, temperature 97.9 F (36.6 C), temperature source Oral, resp. rate 16, height 5\' 5"  (1.651 m), weight 66.5 kg (146 lb 9.7 oz), SpO2 97 %.  General - 69 year old female in no acute distress Heart - Regular rate and rhythm - no murmer appreciated Lungs - Clear to auscultation Abdomen - Soft - non tender Extremities - Distal pulses intact - no edema Skin - Warm and dry  Mental Status: Alert, oriented, thought content appropriate.  Speech fluent without evidence of aphasia.  Able to follow 3 step commands without difficulty. Cranial Nerves: II: Discs not visualized; Visual fields grossly normal, pupils equal, round, reactive to light and accommodation III,IV, VI: ptosis not present, extra-ocular motions  intact bilaterally V,VII: smile symmetric, facial light touch sensation normal bilaterally VIII: hearing normal bilaterally IX,X: gag reflex present XI: bilateral shoulder shrug XII: midline tongue extension Motor: Right : Upper extremity   5/5    Left:     Upper extremity   5/5  Lower extremity   5/5     Lower extremity   5/5 Tone and bulk:normal tone throughout; no atrophy noted Sensory:  Light touch intact throughout, bilaterally Deep Tendon Reflexes: 2+ and symmetric throughout Plantars: Right: downgoing   Left: downgoing Cerebellar: normal finger-to-nose, normal rapid alternating movements and normal heel-to-shin test Gait: Not tested CV: pulses palpable throughout    Laboratory Studies:  Basic Metabolic Panel:  Recent Labs Lab 06/29/14 1023  NA 137  K 3.6  CL 98  CO2 27  GLUCOSE 130*  BUN 16  CREATININE 0.79  CALCIUM 9.6    Liver Function Tests:  Recent Labs Lab 06/29/14 1023  AST 31  ALT 30  ALKPHOS 111  BILITOT 0.3  PROT 7.4  ALBUMIN 4.2   No results for input(s): LIPASE, AMYLASE in the last 168 hours. No results for input(s): AMMONIA in the last 168 hours.  CBC:  Recent Labs Lab 06/29/14 1023  WBC 7.2  NEUTROABS 6.0  HGB 13.1  HCT 39.7  MCV 88.8  PLT 211    Cardiac Enzymes: No results for input(s): CKTOTAL, CKMB, CKMBINDEX, TROPONINI in the last 168 hours.  BNP: Invalid input(s): POCBNP  CBG:  Recent Labs Lab 06/29/14 1009 06/29/14 1659  GLUCAP 116* 74    Microbiology: No results found for this or any previous visit.  Coagulation Studies:  Recent Labs  06/29/14 1023  LABPROT 12.9  INR 0.96    Urinalysis:   Recent Labs Lab 06/29/14 1058  COLORURINE YELLOW  LABSPEC 1.011  PHURINE 6.5  GLUCOSEU NEGATIVE  HGBUR NEGATIVE  BILIRUBINUR NEGATIVE  KETONESUR NEGATIVE  PROTEINUR NEGATIVE  UROBILINOGEN 0.2  NITRITE NEGATIVE  LEUKOCYTESUR NEGATIVE    Lipid Panel:    Component Value Date/Time   CHOL 181  03/18/2014 1437   CHOL 182 12/31/2013 1033   TRIG 160* 03/18/2014 1437   TRIG 153.0* 12/31/2013 1033   HDL 67 03/18/2014 1437   HDL 60.50 12/31/2013 1033   CHOLHDL 3 12/31/2013 1033   VLDL 30.6 12/31/2013 1033   LDLCALC 82 03/18/2014 1437   LDLCALC 91 12/31/2013 1033    HgbA1C: No results found for: HGBA1C  Urine Drug Screen:      Component Value Date/Time   LABOPIA NONE DETECTED 06/29/2014 1128   COCAINSCRNUR NONE DETECTED 06/29/2014 1128   LABBENZ NONE DETECTED 06/29/2014 1128   AMPHETMU NONE DETECTED 06/29/2014 1128   THCU NONE DETECTED 06/29/2014 1128   LABBARB NONE DETECTED 06/29/2014 1128    Alcohol Level:   Recent Labs Lab 06/29/14 1220  ETH <5    Other results: EKG: Sinus rhythm rate 71 bpm with right bundle branch block. Please refer to the formal cardiology reading for complete details.  Imaging:  Dg Chest 2 View 06/29/2014    No active cardiopulmonary disease.     Ct Head (brain) Wo Contrast 06/29/2014    Negative noncontrast head CT.     Mr Brain Wo Contrast (neuro Protocol) 06/29/2014    Patient was moving throughout the examination and fast technique imaging had to be utilized.  No definitive acute infarct identified. One sequence raises possibly of a small acute infarct within the posterior limb of the right internal capsule but is not confirmed on other sequences and I suspect does not represent an acute infarct. Clinical correlation recommended.  Mucosal thickening paranasal sinuses most notable anterior left ethmoid sinus air cell junction with the left frontal sinus.       Assessment: 69 y.o. female [facility] emergency department today by her family for evaluation of weakness, slurred speech, unsteadiness, and mild confusion in the setting of a recent respiratory tract infection. Cannot rule out possible TIA. Will complete stroke workup.   Stroke Risk Factors - hyperlipidemia  Plan:  1. HgbA1c, fasting lipid panel 2. MRI, MRA  of the brain without  contrast 3. Frequent neuro checks 4. Echocardiogram 5. Carotid dopplers 6. Prophylactic therapy-Antiplatelet med: Aspirin - dose 325mg  PO or 300mg  PR 7. Risk factor modification 8. Telemetry monitoring 9. PT consult, OT consult, Speech consult 10. NPO until RN stroke swallow screen   Jim Like, DO [facility] [phone]  If 7pm- 7am, please page neurology on  call as listed in Echo.

## 2014-06-29 NOTE — ED Notes (Signed)
She remains awake, drowsy and in no distress.  Her speech, while mildly slurred and slow, is intelligible and she is oriented x 4.  Dr. Doy Mince has just spent much time with pt. And her husband. Monitor shows nsr without ectopy and I have placed her on the Bair hugger at 1050 hours upon her return from x-ray/CT.

## 2014-06-29 NOTE — Consult Note (Deleted)
Referring Physician: Dr Dyann Kief    Chief Complaint: Slurred speech  HPI: Evelyn Murphy is a 69 y.o. female with a history of hyperlipidemia, anxiety, and recent lower respiratory tract infection who was brought to the emergency department today by her family over concerns for generalized weakness, slurred speech, and slowed mentation. The patient has had a recent lower respiratory tract infection with nonproductive cough. She's been spending a lot of time in bed and taking TheraFlu as well as Ambien which she takes for insomnia. Today she had difficulty getting out of bed due to generalized weakness. She had a fall but denies injury. Later her husband noticed that she was downstairs sitting in a chair but was poorly responsive and mildly confused. When she stood she seemed to be unsteady on her feet although when he checked her grip strength it was equal bilaterally. He did notice any facial droop however her speech was somewhat slurred. She was brought to the emergency department where she had a head CT and an MRI (see below). The patient and her family family feel she is back to baseline now.  Also of note earlier in the week the patient was out with some friends having a discussion. All of a sudden she developed difficulty with word finding and expressing herself. This episode lasted approximately 5 minutes. She will be admitted for further stroke/TIA workup.  Date last known well: Date: 06/28/2014 Time last known well: Time: 22:00 tPA Given: No: The patient is out of the window for treatment and her deficits have resolved.  Past Medical History  Diagnosis Date  . Hyperlipidemia   . Anxiety   . Insomnia   . RBBB     Past Surgical History  Procedure Laterality Date  . Shoulder arthroscopy  10/2011    R Dr Meyer Cory  . Colonoscopy      Family History  Problem Relation Age of Onset  . Heart disease Mother 66    CABG  . Diabetes Mother   . Heart disease Father     Father died of MI at 42   . COPD Father   . Heart disease Brother   . Diabetes Brother   No family history of strokes  Social History:  reports that she has never smoked. She has never used smokeless tobacco. She reports that she does not drink alcohol or use illicit drugs. the patient is married. She has 2 children and 3 grandchildren. 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  Allergies:  Allergies  Allergen Reactions  . Linzess [Linaclotide]     Too strong  . Lipitor [Atorvastatin]     elev LFTs    Medications:  No current facility-administered medications for this encounter.   Current Outpatient Prescriptions  Medication Sig Dispense Refill  . citalopram (CELEXA) 40 MG tablet Take 1 tablet (40 mg total) by mouth daily. 90 tablet 3  . ezetimibe (ZETIA) 10 MG tablet Take 1 tablet (10 mg total) by mouth daily. (Patient taking differently: Take 10 mg by mouth daily. Pt states that she takes a half tablet daily.) 100 tablet 3  . nortriptyline (PAMELOR) 75 MG capsule Take 1 capsule (75 mg total) by mouth at bedtime. 90 capsule 3  . pantoprazole (PROTONIX) 40 MG tablet Take 1 tablet (40 mg total) by mouth daily. 90 tablet 3  . simvastatin (ZOCOR) 40 MG tablet TAKE ONE TABLET BY MOUTH AT BEDTIME  90 tablet 1  . zolpidem (AMBIEN) 10 MG tablet Take  1 tablet (10 mg total) by mouth at bedtime. (Patient taking differently: Take 10 mg by mouth at bedtime. Pt states that she takes a half tablet at bedtime.) 90 tablet 1    ROS: History obtained from the patient  General ROS: negative for - chills, fever, night sweats, weight gain or weight loss. Positive for fatigue and poor appetite. Psychological ROS: negative for - behavioral disorder, hallucinations, memory difficulties, mood swings or suicidal ideation Ophthalmic ROS: negative for - blurry vision, double vision, eye pain or loss of vision ENT ROS: negative for - epistaxis, nasal discharge, oral lesions, sore throat, tinnitus or  vertigo Allergy and Immunology ROS: negative for - hives or itchy/watery eyes Hematological and Lymphatic ROS: negative for - bleeding problems, bruising or swollen lymph nodes Endocrine ROS: negative for - galactorrhea, hair pattern changes, polydipsia/polyuria or temperature intolerance Respiratory ROS: negative for - hemoptysis, shortness of breath or wheezing. Positive for a nonproductive cough. Cardiovascular ROS: negative for - chest pain, dyspnea on exertion, edema or irregular heartbeat Gastrointestinal ROS: negative for - abdominal pain, diarrhea, hematemesis, nausea/vomiting or stool incontinence Genito-Urinary ROS: negative for - dysuria, hematuria, incontinence or urinary frequency/urgency Musculoskeletal ROS: negative for - joint swelling or muscular weakness Neurological ROS: as noted in HPI Dermatological ROS: negative for rash and skin lesion changes   Physical Examination: Blood pressure 101/67, pulse 77, temperature 98.1 F (36.7 C), temperature source Oral, resp. rate 16, SpO2 97 %.  General - 69 year old female in no acute distress Heart - Regular rate and rhythm - no murmer appreciated Lungs - Clear to auscultation Abdomen - Soft - non tender Extremities - Distal pulses intact - no edema Skin - Warm and dry  Mental Status: Alert, oriented, thought content appropriate.  Speech fluent without evidence of aphasia.  Able to follow 3 step commands without difficulty. Cranial Nerves: II: Discs not visualized; Visual fields grossly normal, pupils equal, round, reactive to light and accommodation III,IV, VI: ptosis not present, extra-ocular motions intact bilaterally V,VII: smile symmetric, facial light touch sensation normal bilaterally VIII: hearing normal bilaterally IX,X: gag reflex present XI: bilateral shoulder shrug XII: midline tongue extension Motor: Right : Upper extremity   5/5    Left:     Upper extremity   5/5  Lower extremity   5/5     Lower extremity    5/5 Tone and bulk:normal tone throughout; no atrophy noted Sensory:  Light touch intact throughout, bilaterally Deep Tendon Reflexes: 2+ and symmetric throughout Plantars: Right: downgoing   Left: downgoing Cerebellar: normal finger-to-nose, normal rapid alternating movements and normal heel-to-shin test Gait: Not tested CV: pulses palpable throughout    Laboratory Studies:  Basic Metabolic Panel:  Recent Labs Lab 06/29/14 1023  NA 137  K 3.6  CL 98  CO2 27  GLUCOSE 130*  BUN 16  CREATININE 0.79  CALCIUM 9.6    Liver Function Tests:  Recent Labs Lab 06/29/14 1023  AST 31  ALT 30  ALKPHOS 111  BILITOT 0.3  PROT 7.4  ALBUMIN 4.2   No results for input(s): LIPASE, AMYLASE in the last 168 hours. No results for input(s): AMMONIA in the last 168 hours.  CBC:  Recent Labs Lab 06/29/14 1023  WBC 7.2  NEUTROABS 6.0  HGB 13.1  HCT 39.7  MCV 88.8  PLT 211    Cardiac Enzymes: No results for input(s): CKTOTAL, CKMB, CKMBINDEX, TROPONINI in the last 168 hours.  BNP: Invalid input(s): POCBNP  CBG:  Recent Labs Lab 06/29/14 1009  GLUCAP 116*    Microbiology: No results found for this or any previous visit.  Coagulation Studies:  Recent Labs  06/29/14 1023  LABPROT 12.9  INR 0.96    Urinalysis:  Recent Labs Lab 06/29/14 1058  COLORURINE YELLOW  LABSPEC 1.011  PHURINE 6.5  GLUCOSEU NEGATIVE  HGBUR NEGATIVE  BILIRUBINUR NEGATIVE  KETONESUR NEGATIVE  PROTEINUR NEGATIVE  UROBILINOGEN 0.2  NITRITE NEGATIVE  LEUKOCYTESUR NEGATIVE    Lipid Panel:    Component Value Date/Time   CHOL 181 03/18/2014 1437   CHOL 182 12/31/2013 1033   TRIG 160* 03/18/2014 1437   TRIG 153.0* 12/31/2013 1033   HDL 67 03/18/2014 1437   HDL 60.50 12/31/2013 1033   CHOLHDL 3 12/31/2013 1033   VLDL 30.6 12/31/2013 1033   LDLCALC 82 03/18/2014 1437   LDLCALC 91 12/31/2013 1033    HgbA1C: No results found for: HGBA1C  Urine Drug Screen:     Component  Value Date/Time   LABOPIA NONE DETECTED 06/29/2014 1128   COCAINSCRNUR NONE DETECTED 06/29/2014 1128   LABBENZ NONE DETECTED 06/29/2014 1128   AMPHETMU NONE DETECTED 06/29/2014 1128   THCU NONE DETECTED 06/29/2014 1128   LABBARB NONE DETECTED 06/29/2014 1128    Alcohol Level:  Recent Labs Lab 06/29/14 1220  ETH <5    Other results: EKG: Sinus rhythm rate 71 bpm with right bundle branch block. Please refer to the formal cardiology reading for complete details.  Imaging:  Dg Chest 2 View 06/29/2014    No active cardiopulmonary disease.     Ct Head (brain) Wo Contrast 06/29/2014    Negative noncontrast head CT.     Mr Brain Wo Contrast (neuro Protocol) 06/29/2014    Patient was moving throughout the examination and fast technique imaging had to be utilized.  No definitive acute infarct identified. One sequence raises possibly of a small acute infarct within the posterior limb of the right internal capsule but is not confirmed on other sequences and I suspect does not represent an acute infarct. Clinical correlation recommended.  Mucosal thickening paranasal sinuses most notable anterior left ethmoid sinus air cell junction with the left frontal sinus.       Assessment: 69 y.o. female [facility] emergency department today by her family for evaluation of weakness, slurred speech, unsteadiness, and mild confusion in the setting of a recent respiratory tract infection.  Stroke Risk Factors - hyperlipidemia  Plan:  Dr Leonel Ramsay to see patient.  Mikey Bussing PA-C [facility] Pager [phone] 06/29/2014, 2:56 PM

## 2014-06-29 NOTE — ED Notes (Signed)
Pt husband pt last seen normal 2200 last night. Pt complaint of slurred speech, delay thought process, and unsteady gait.

## 2014-06-29 NOTE — ED Notes (Signed)
D/t pt. Drowsiness I defer her swallow study.

## 2014-06-29 NOTE — ED Notes (Signed)
To MRI now.  She remains in no distress.

## 2014-06-29 NOTE — ED Notes (Signed)
She is back from MRI; and I do not re-apply the Bair Hugger (see v.s.).

## 2014-06-29 NOTE — ED Notes (Signed)
Pt back from MRI 

## 2014-06-29 NOTE — ED Provider Notes (Signed)
CSN: [id number]     Arrival date & time 06/29/14  9528 History   First MD Initiated Contact with Patient 06/29/14 1009     Chief Complaint  Patient presents with  . Aphasia     (Consider location/radiation/quality/duration/timing/severity/associated sxs/prior Treatment) HPI Patient presents to the emergency department with altered mental status and difficulty walking that started last night.  The patient states that this morning she woke up and still was having trouble walking.  She has been sick recently with an upper respiratory illness.  Patient denies chest pain, short of breath, nausea, vomiting, weakness, dizziness, headache, blurred vision, back pain, neck pain, numbness, or syncope.  The patient states that she did not take any medications prior to arrival.  The husband was concerned that she may have taken too many of her sleeping pills to try to get some sleep that she has not been sleeping well since she has been sick.  Patient has not voiced any suicidal ideations Past Medical History  Diagnosis Date  . Hyperlipidemia   . Anxiety   . Insomnia   . RBBB    Past Surgical History  Procedure Laterality Date  . Shoulder arthroscopy  10/2011    R Dr Meyer Cory  . Colonoscopy     Family History  Problem Relation Age of Onset  . Heart disease Mother 52    CABG  . Diabetes Mother   . Heart disease Father     Father died of MI at 46  . COPD Father   . Heart disease Brother   . Diabetes Brother    History  Substance Use Topics  . Smoking status: Never Smoker   . Smokeless tobacco: Never Used  . Alcohol Use: No   OB History    No data available     Review of Systems All other systems negative except as documented in the HPI. All pertinent positives and negatives as reviewed in the HPI.   Allergies  Linzess and Lipitor  Home Medications   Prior to Admission medications   Medication Sig Start Date End Date Taking? Authorizing Provider  citalopram (CELEXA) 40 MG  tablet Take 1 tablet (40 mg total) by mouth daily. 01/31/14   Aleksei Plotnikov V, MD  citalopram (CELEXA) 40 MG tablet TAKE ONE TABLET BY MOUTH ONE TIME DAILY  05/13/14   Aleksei Plotnikov V, MD  ezetimibe (ZETIA) 10 MG tablet Take 1 tablet (10 mg total) by mouth daily. Patient taking differently: Take 10 mg by mouth daily. Pt states that she takes a half tablet daily. 01/31/14   Aleksei Plotnikov V, MD  nortriptyline (PAMELOR) 75 MG capsule Take 1 capsule (75 mg total) by mouth at bedtime. 01/31/14   Aleksei Plotnikov V, MD  pantoprazole (PROTONIX) 40 MG tablet Take 1 tablet (40 mg total) by mouth daily. 02/05/14   Gatha Mayer, MD  simvastatin (ZOCOR) 40 MG tablet TAKE ONE TABLET BY MOUTH AT BEDTIME  05/13/14   Thayer Headings, MD  zolpidem (AMBIEN) 10 MG tablet Take 1 tablet (10 mg total) by mouth at bedtime. Patient taking differently: Take 10 mg by mouth at bedtime. Pt states that she takes a half tablet at bedtime. 02/18/14   Aleksei Plotnikov V, MD   BP 135/93 mmHg  Pulse 67  Temp(Src) 95.6 F (35.3 C) (Rectal)  Resp 18  SpO2 100% Physical Exam  Constitutional: She appears well-developed and well-nourished. No distress.  HENT:  Head: Normocephalic and atraumatic.  Mouth/Throat: Oropharynx is clear and  moist.  Eyes: EOM are normal. Pupils are equal, round, and reactive to light.  Neck: Normal range of motion. Neck supple.  Cardiovascular: Normal rate, regular rhythm and normal heart sounds.  Exam reveals no gallop and no friction rub.   No murmur heard. Pulmonary/Chest: Effort normal and breath sounds normal. No respiratory distress. She has no wheezes.  Neurological: She is alert. She exhibits normal muscle tone. Coordination normal.  Patient is oriented to the day of the month and year  Skin: Skin is warm and dry. No rash noted. No erythema.  Nursing note and vitals reviewed.   ED Course  Procedures (including critical care time) Labs Review Labs Reviewed  APTT -  Abnormal; Notable for the following:    aPTT 22 (*)    All other components within normal limits  DIFFERENTIAL - Abnormal; Notable for the following:    Neutrophils Relative % 83 (*)    Lymphocytes Relative 11 (*)    All other components within normal limits  COMPREHENSIVE METABOLIC PANEL - Abnormal; Notable for the following:    Glucose, Bld 130 (*)    GFR calc non Af Amer 85 (*)    All other components within normal limits  CBG MONITORING, ED - Abnormal; Notable for the following:    Glucose-Capillary 116 (*)    All other components within normal limits  URINE CULTURE  PROTIME-INR  CBC  URINALYSIS, ROUTINE W REFLEX MICROSCOPIC  I-STAT TROPOININ, ED  I-STAT CG4 LACTIC ACID, ED    Imaging Review Ct Head (brain) Wo Contrast  06/29/2014   CLINICAL DATA:  Last seen normal at 2220 last evening, now with slurred speech, delayed thought process and unsteady gait.  EXAM: CT HEAD WITHOUT CONTRAST  TECHNIQUE: Contiguous axial images were obtained from the base of the skull through the vertex without intravenous contrast.  COMPARISON:  None.  FINDINGS: Gray-white differentiation is maintained. No CT evidence of acute large territory infarct. No intraparenchymal or extra-axial mass or hemorrhage. Normal size and configuration of the ventricles and basilar cisterns. No midline shift. There is minimal opacification of left anterior ethmoidal air cells. The remaining paranasal sinuses and mastoid air cells are normally aerated. No air-fluid levels. Regional soft tissues appear normal. No displaced calvarial fracture. Regional soft tissues appear normal.  IMPRESSION: Negative noncontrast head CT.   Electronically Signed   By: Sandi Mariscal M.D.   On: 06/29/2014 10:37     EKG Interpretation   Date/Time:  Saturday June 29 2014 10:09:21 EDT Ventricular Rate:  71 PR Interval:  185 QRS Duration: 177 QT Interval:  464 QTC Calculation: 504 R Axis:   109 Text Interpretation:  Sinus rhythm RBBB and LPFB  No old tracing to compare  Confirmed by [facility]  MD, TREY ([id number]) on 06/29/2014 10:29:11 AM      MDM   Final diagnoses:  None      Patient be admitted to the hospital for altered mental status.  I spoke with the [facility] and then neuro hospitalist and they will both his evaluate the patient.  Dalia Heading, PA-C 07/03/14 1511  Serita Grit, MD 07/05/14 [phone]

## 2014-06-29 NOTE — ED Notes (Signed)
Nurse is getting blood and putting in an IV.

## 2014-06-30 DIAGNOSIS — R2681 Unsteadiness on feet: Secondary | ICD-10-CM

## 2014-06-30 DIAGNOSIS — R4182 Altered mental status, unspecified: Secondary | ICD-10-CM | POA: Diagnosis not present

## 2014-06-30 DIAGNOSIS — R4781 Slurred speech: Secondary | ICD-10-CM | POA: Diagnosis not present

## 2014-06-30 DIAGNOSIS — G459 Transient cerebral ischemic attack, unspecified: Secondary | ICD-10-CM | POA: Diagnosis not present

## 2014-06-30 DIAGNOSIS — R410 Disorientation, unspecified: Secondary | ICD-10-CM | POA: Diagnosis not present

## 2014-06-30 DIAGNOSIS — F418 Other specified anxiety disorders: Secondary | ICD-10-CM | POA: Diagnosis not present

## 2014-06-30 LAB — LIPID PANEL
CHOL/HDL RATIO: 2.5 ratio
Cholesterol: 144 mg/dL (ref 0–200)
HDL: 57 mg/dL (ref >39)
LDL Cholesterol: 58 mg/dL (ref 0–99)
Triglycerides: 143 mg/dL (ref <150)
VLDL: 29 mg/dL (ref 0–40)

## 2014-06-30 LAB — URINE CULTURE
Colony Count: NO GROWTH
Culture: NO GROWTH

## 2014-06-30 LAB — BASIC METABOLIC PANEL
Anion gap: 7 (ref 5–15)
BUN: 18 mg/dL (ref 6–23)
CHLORIDE: 105 mmol/L (ref 96–112)
CO2: 30 mmol/L (ref 19–32)
CREATININE: 0.89 mg/dL (ref 0.50–1.10)
Calcium: 9 mg/dL (ref 8.4–10.5)
GFR calc non Af Amer: 66 mL/min — ABNORMAL LOW (ref >90)
GFR, EST AFRICAN AMERICAN: 77 mL/min — AB (ref >90)
GLUCOSE: 99 mg/dL (ref 70–99)
POTASSIUM: 3.6 mmol/L (ref 3.5–5.1)
SODIUM: 142 mmol/L (ref 135–145)

## 2014-06-30 LAB — CBC
HEMATOCRIT: 36 % (ref 36.0–46.0)
HEMOGLOBIN: 11.5 g/dL — AB (ref 12.0–15.0)
MCH: 28.5 pg (ref 26.0–34.0)
MCHC: 31.9 g/dL (ref 30.0–36.0)
MCV: 89.1 fL (ref 78.0–100.0)
Platelets: 225 10*3/uL (ref 150–400)
RBC: 4.04 MIL/uL (ref 3.87–5.11)
RDW: 12 % (ref 11.5–15.5)
WBC: 5.3 10*3/uL (ref 4.0–10.5)

## 2014-06-30 LAB — GLUCOSE, CAPILLARY
GLUCOSE-CAPILLARY: 129 mg/dL — AB (ref 70–99)
GLUCOSE-CAPILLARY: 176 mg/dL — AB (ref 70–99)
Glucose-Capillary: 97 mg/dL (ref 70–99)
Glucose-Capillary: 82 mg/dL (ref 70–99)

## 2014-06-30 LAB — VITAMIN B12: Vitamin B-12: 320 pg/mL (ref 211–911)

## 2014-06-30 LAB — RPR: RPR: NONREACTIVE

## 2014-06-30 NOTE — Progress Notes (Signed)
*  PRELIMINARY RESULTS* Vascular Ultrasound Carotid Duplex (Doppler) has been completed.   Findings suggest 1-39% internal carotid artery stenosis bilaterally. Vertebral arteries are patent with antegrade flow.  06/30/2014 3:21 PM Maudry Mayhew, RVT, RDCS, RDMS

## 2014-06-30 NOTE — Progress Notes (Signed)
[facility] PROGRESS NOTE  Evelyn Murphy TDH:[id number] DOB: 28-Jul-1945 DOA: 06/29/2014  PCP: Walker Kehr, MD  Brief HPI: 69 year old Caucasian female presented with unsteady K8. Slurred speech and confusion. There was a concern about acute stroke and hence she was admitted for further management.  Past medical history:  Past Medical History  Diagnosis Date  . Hyperlipidemia   . Anxiety   . Insomnia   . RBBB     Consultants: Neurology  Procedures: 2-D echocardiogram and carotid Dopplers are pending  Antibiotics: None  Subjective: Patient feels like she is back to baseline today. Her husband is at the bedside who also agrees. Patient tells me that she is on 2 different agents for insomnia. She has also been taking TheraFlu and NyQuil for symptoms of cold and congestion for the last couple of days. She may have taken them at increasing frequency the last 2 days.  Objective: Vital Signs  Filed Vitals:   06/29/14 1634 06/29/14 1654 06/29/14 2143 06/30/14 0601  BP: 116/61 126/69 142/87 132/70  Pulse: 80 71 85 68  Temp:  97.9 F (36.6 C) 98.2 F (36.8 C) 98.2 F (36.8 C)  TempSrc:  Oral Oral Oral  Resp:  16 16 16   Height:  5\' 5"  (1.651 m)    Weight:  66.5 kg (146 lb 9.7 oz)    SpO2: 97% 97% 97% 100%    Intake/Output Summary (Last 24 hours) at 06/30/14 0953 Last data filed at 06/30/14 0700  Gross per 24 hour  Intake   1690 ml  Output   1550 ml  Net    140 ml   Filed Weights   06/29/14 1654  Weight: 66.5 kg (146 lb 9.7 oz)    General appearance: alert, cooperative, appears stated age and no distress Resp: clear to auscultation bilaterally Cardio: regular rate and rhythm, S1, S2 normal, no murmur, click, rub or gallop GI: soft, non-tender; bowel sounds normal; no masses,  no organomegaly Extremities: extremities normal, atraumatic, no cyanosis or edema Neurologic: Alert and oriented 3. Cranial nerves II-12 intact. No pronator drift. No nystagmus.  Finger-to-nose normal. Strength in the upper and lower extremities are equal bilaterally and normal. Gait not assessed. Speech appears to be normal though a little hurried.  Lab Results:  Basic Metabolic Panel:  Recent Labs Lab 06/29/14 1023 06/30/14 0513  NA 137 142  K 3.6 3.6  CL 98 105  CO2 27 30  GLUCOSE 130* 99  BUN 16 18  CREATININE 0.79 0.89  CALCIUM 9.6 9.0   Liver Function Tests:  Recent Labs Lab 06/29/14 1023  AST 31  ALT 30  ALKPHOS 111  BILITOT 0.3  PROT 7.4  ALBUMIN 4.2   CBC:  Recent Labs Lab 06/29/14 1023 06/30/14 0513  WBC 7.2 5.3  NEUTROABS 6.0  --   HGB 13.1 11.5*  HCT 39.7 36.0  MCV 88.8 89.1  PLT 211 225   CBG:  Recent Labs Lab 06/29/14 1009 06/29/14 1659 06/29/14 2139 06/30/14 0728  GLUCAP 116* 74 110* 97    Studies/Results: Dg Chest 2 View  06/29/2014   CLINICAL DATA:  Altered mental status  EXAM: CHEST  2 VIEW  COMPARISON:  11/22/2013  FINDINGS: Cardiomediastinal silhouette is stable. No acute infiltrate or pleural effusion. No pulmonary edema. Mild degenerative changes thoracic spine.  IMPRESSION: No active cardiopulmonary disease.   Electronically Signed   By: Lahoma Crocker M.D.   On: 06/29/2014 11:07   Ct Head (brain) Wo Contrast  06/29/2014  CLINICAL DATA:  Last seen normal at 2220 last evening, now with slurred speech, delayed thought process and unsteady gait.  EXAM: CT HEAD WITHOUT CONTRAST  TECHNIQUE: Contiguous axial images were obtained from the base of the skull through the vertex without intravenous contrast.  COMPARISON:  None.  FINDINGS: Gray-white differentiation is maintained. No CT evidence of acute large territory infarct. No intraparenchymal or extra-axial mass or hemorrhage. Normal size and configuration of the ventricles and basilar cisterns. No midline shift. There is minimal opacification of left anterior ethmoidal air cells. The remaining paranasal sinuses and mastoid air cells are normally aerated. No air-fluid  levels. Regional soft tissues appear normal. No displaced calvarial fracture. Regional soft tissues appear normal.  IMPRESSION: Negative noncontrast head CT.   Electronically Signed   By: Sandi Mariscal M.D.   On: 06/29/2014 10:37   Mr Brain Wo Contrast (neuro Protocol)  06/29/2014   CLINICAL DATA:  69 year old female with hyperlipidemia presenting with acute onset of slurred speech this morning. Unsteady gait and slight confusion with weakness. Initial encounter.  EXAM: MRI HEAD WITHOUT CONTRAST  TECHNIQUE: Multiplanar, multiecho pulse sequences of the brain and surrounding structures were obtained without intravenous contrast.  COMPARISON:  06/29/2014 head CT.  No comparison brain MR.  FINDINGS: Patient was moving throughout the examination and fast technique imaging had to be utilized.  No definitive acute infarct identified. One sequence raises possibly of a small acute infarct within the posterior limb of the right internal capsule but is not confirmed on other sequences and I suspect does not represent an acute infarct. Clinical correlation recommended.  Very mild small vessel disease.  No intracranial hemorrhage.  No hydrocephalus.  No intracranial mass lesion noted on this unenhanced exam.  Major intracranial vascular structures are patent.  Mucosal thickening paranasal sinuses most notable anterior left ethmoid sinus air cell junction with the left frontal sinus.  Cervical medullary junction, pituitary region, pineal region and orbital structures unremarkable.  IMPRESSION: Patient was moving throughout the examination and fast technique imaging had to be utilized.  No definitive acute infarct identified. One sequence raises possibly of a small acute infarct within the posterior limb of the right internal capsule but is not confirmed on other sequences and I suspect does not represent an acute infarct. Clinical correlation recommended.  Mucosal thickening paranasal sinuses most notable anterior left ethmoid  sinus air cell junction with the left frontal sinus.   Electronically Signed   By: Genia Del M.D.   On: 06/29/2014 14:21    Medications:  Scheduled: .  stroke: mapping our early stages of recovery book   Does not apply Once  . aspirin  325 mg Oral Daily  . citalopram  40 mg Oral Daily  . ezetimibe  10 mg Oral Daily  . heparin  5,000 Units Subcutaneous 3 times per day  . nortriptyline  75 mg Oral QHS  . pantoprazole  40 mg Oral Daily  . simvastatin  40 mg Oral QHS   Continuous:  GYK:[id number], albuterol  Assessment/Plan:  Active Problems:   Hyperlipidemia   Depression with anxiety   Asthma   GERD (gastroesophageal reflux disease)   Slurred speech   Unsteady gait   Confusion   TIA (transient ischemic attack)    Slurred speech, unsteady gait and confusion Appears to be more of a medication issue rather than a TIA or a stroke. MRI was abnormal, but the abnormality is thought to be an artifact. Patient does not have any focal deficits currently.  She took multiple drugs which have a sedating effect (combination of Ambien, nortriptyline, TheraFlu, NyQuil) . That's what her husband thinks as well. Await 2-D echocardiogram and carotid Doppler. Also await further input from neurology. PT and OT to assess as well. Continue aspirin for now. UA was normal.  Hyperlipidemia Continue Zocor and zetia. Lipid panel is pending  History of Depression with anxiety Mood appears to be stable. Will continue the use of Celexa and Pamelor.  History of Asthma Appears to be stable.   GERD (gastroesophageal reflux disease) Will continue the use of Protonix  DVT Prophylaxis: Heparin    Code Status: Full code  Family Communication: Discussed with the patient and her husband  Disposition Plan: Await PT and OT evaluation. Await neurology input. Await echocardiogram and carotid Doppler.      [facility] Pager [phone] 06/30/2014, 9:53 AM  If 7PM-7AM, please  contact night-coverage at [email or web address], password [facility]

## 2014-06-30 NOTE — Progress Notes (Signed)
  Echocardiogram 2D Echocardiogram has been performed.  Lysle Rubens 06/30/2014, 11:20 AM

## 2014-06-30 NOTE — Progress Notes (Signed)
UR completed 

## 2014-07-01 DIAGNOSIS — R4182 Altered mental status, unspecified: Secondary | ICD-10-CM | POA: Diagnosis not present

## 2014-07-01 DIAGNOSIS — R4781 Slurred speech: Secondary | ICD-10-CM | POA: Diagnosis not present

## 2014-07-01 LAB — HEMOGLOBIN A1C
Hgb A1c MFr Bld: 6.2 % — ABNORMAL HIGH (ref 4.8–5.6)
MEAN PLASMA GLUCOSE: 131 mg/dL

## 2014-07-01 LAB — BASIC METABOLIC PANEL
ANION GAP: 8 (ref 5–15)
BUN: 16 mg/dL (ref 6–23)
CHLORIDE: 105 mmol/L (ref 96–112)
CO2: 30 mmol/L (ref 19–32)
Calcium: 9.3 mg/dL (ref 8.4–10.5)
Creatinine, Ser: 0.83 mg/dL (ref 0.50–1.10)
GFR calc Af Amer: 83 mL/min — ABNORMAL LOW (ref >90)
GFR calc non Af Amer: 72 mL/min — ABNORMAL LOW (ref >90)
GLUCOSE: 97 mg/dL (ref 70–99)
POTASSIUM: 3.4 mmol/L — AB (ref 3.5–5.1)
Sodium: 143 mmol/L (ref 135–145)

## 2014-07-01 LAB — CBC
HCT: 36.6 % (ref 36.0–46.0)
Hemoglobin: 11.7 g/dL — ABNORMAL LOW (ref 12.0–15.0)
MCH: 28.4 pg (ref 26.0–34.0)
MCHC: 32 g/dL (ref 30.0–36.0)
MCV: 88.8 fL (ref 78.0–100.0)
PLATELETS: 225 10*3/uL (ref 150–400)
RBC: 4.12 MIL/uL (ref 3.87–5.11)
RDW: 12 % (ref 11.5–15.5)
WBC: 5.7 10*3/uL (ref 4.0–10.5)

## 2014-07-01 LAB — GLUCOSE, CAPILLARY: Glucose-Capillary: 101 mg/dL — ABNORMAL HIGH (ref 70–99)

## 2014-07-01 MED ORDER — POTASSIUM CHLORIDE CRYS ER 20 MEQ PO TBCR
40.0000 meq | EXTENDED_RELEASE_TABLET | Freq: Once | ORAL | Status: AC
  Administered 2014-07-01: 40 meq via ORAL
  Filled 2014-07-01: qty 2

## 2014-07-01 MED ORDER — ASPIRIN EC 81 MG PO TBEC: 81.0000 mg | DELAYED_RELEASE_TABLET | Freq: Every day | ORAL | Status: AC

## 2014-07-01 NOTE — Evaluation (Addendum)
Physical Therapy Evaluation Patient Details Name: Evelyn Murphy MRN: [id number] DOB: Dec 27, 1945 Today's Date: 07/01/2014   History of Present Illness  69 y.o. female admitted with unsteady gait, slurred speech. Pt had taken multiple sedating medications.   Clinical Impression  Pt is independent with mobility, she walked 400' without assistive device, with no loss of balance. She's safe to DC home and resume her exercise program from PT standpoint. PT signing off, no further PT nor OT needs.     Follow Up Recommendations No PT follow up    Equipment Recommendations  None recommended by PT    Recommendations for Other Services       Precautions / Restrictions Precautions Precautions: None Restrictions Weight Bearing Restrictions: No      Mobility  Bed Mobility Overal bed mobility: Independent                Transfers Overall transfer level: Independent                  Ambulation/Gait Ambulation/Gait assistance: Independent Ambulation Distance (Feet): 400 Feet Assistive device: None Gait Pattern/deviations: WFL(Within Functional Limits)   Gait velocity interpretation: at or above normal speed for age/gender General Gait Details: no LOB  Stairs            Wheelchair Mobility    Modified Rankin (Stroke Patients Only)       Balance Overall balance assessment: Independent                                           Pertinent Vitals/Pain Pain Assessment: No/denies pain    Home Living Family/patient expects to be discharged to:: Private residence Living Arrangements: Spouse/significant other Available Help at Discharge: Family           Home Equipment: None      Prior Function Level of Independence: Independent         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, newspaper        Extremity/Trunk Assessment   Upper Extremity Assessment: Overall WFL for tasks assessed           Lower Extremity Assessment:  Overall WFL for tasks assessed      Cervical / Trunk Assessment: Normal  Communication   Communication: No difficulties  Cognition Arousal/Alertness: Awake/alert Behavior During Therapy: WFL for tasks assessed/performed Overall Cognitive Status: Within Functional Limits for tasks assessed                      General Comments      Exercises        Assessment/Plan    PT Assessment Patent does not need any further PT services  PT Diagnosis     PT Problem List    PT Treatment Interventions     PT Goals (Current goals can be found in the Care Plan section) Acute Rehab PT Goals Patient Stated Goal: return to working out daily PT Goal Formulation: All assessment and education complete, DC therapy    Frequency     Barriers to discharge        Co-evaluation               End of Session Equipment Utilized During Treatment: Gait belt Activity Tolerance: Patient tolerated treatment well Patient left: in chair Nurse Communication: Mobility status    Functional Assessment Tool Used: clinical judgement  Functional Limitation: Mobility: Walking and moving around Mobility: Walking and Moving Around Current Status [phone]): 0 percent impaired, limited or restricted Mobility: Walking and Moving Around Goal Status [phone]): 0 percent impaired, limited or restricted Mobility: Walking and Moving Around Discharge Status [phone]): 0 percent impaired, limited or restricted    Time: 0814-4818 PT Time Calculation (min) (ACUTE ONLY): 10 min   Charges:   PT Evaluation $Initial PT Evaluation Tier I: 1 Procedure     PT G Codes:   PT G-Codes **NOT FOR INPATIENT CLASS** Functional Assessment Tool Used: clinical judgement Functional Limitation: Mobility: Walking and moving around Mobility: Walking and Moving Around Current Status (H6314): 0 percent impaired, limited or restricted Mobility: Walking and Moving Around Goal Status (H7026): 0 percent impaired, limited or  restricted Mobility: Walking and Moving Around Discharge Status [phone]): 0 percent impaired, limited or restricted    Evelyn Murphy 07/01/2014, 11:15 AM [phone]

## 2014-07-01 NOTE — Discharge Instructions (Signed)
Insomnia Insomnia is frequent trouble falling and/or staying asleep. Insomnia can be a long term problem or a short term problem. Both are common. Insomnia can be a short term problem when the wakefulness is related to a certain stress or worry. Long term insomnia is often related to ongoing stress during waking hours and/or poor sleeping habits. Overtime, sleep deprivation itself can make the problem worse. Every little thing feels more severe because you are overtired and your ability to cope is decreased. CAUSES   Stress, anxiety, and depression.  Poor sleeping habits.  Distractions such as TV in the bedroom.  Naps close to bedtime.  Engaging in emotionally charged conversations before bed.  Technical reading before sleep.  Alcohol and other sedatives. They may make the problem worse. They can hurt normal sleep patterns and normal dream activity.  Stimulants such as caffeine for several hours prior to bedtime.  Pain syndromes and shortness of breath can cause insomnia.  Exercise late at night.  Changing time zones may cause sleeping problems (jet lag). It is sometimes helpful to have someone observe your sleeping patterns. They should look for periods of not breathing during the night (sleep apnea). They should also look to see how long those periods last. If you live alone or observers are uncertain, you can also be observed at a sleep clinic where your sleep patterns will be professionally monitored. Sleep apnea requires a checkup and treatment. Give your caregivers your medical history. Give your caregivers observations your family has made about your sleep.  SYMPTOMS   Not feeling rested in the morning.  Anxiety and restlessness at bedtime.  Difficulty falling and staying asleep. TREATMENT   Your caregiver may prescribe treatment for an underlying medical disorders. Your caregiver can give advice or help if you are using alcohol or other drugs for self-medication. Treatment  of underlying problems will usually eliminate insomnia problems.  Medications can be prescribed for short time use. They are generally not recommended for lengthy use.  Over-the-counter sleep medicines are not recommended for lengthy use. They can be habit forming.  You can promote easier sleeping by making lifestyle changes such as:  Using relaxation techniques that help with breathing and reduce muscle tension.  Exercising earlier in the day.  Changing your diet and the time of your last meal. No night time snacks.  Establish a regular time to go to bed.  Counseling can help with stressful problems and worry.  Soothing music and white noise may be helpful if there are background noises you cannot remove.  Stop tedious detailed work at least one hour before bedtime. HOME CARE INSTRUCTIONS   Keep a diary. Inform your caregiver about your progress. This includes any medication side effects. See your caregiver regularly. Take note of:  Times when you are asleep.  Times when you are awake during the night.  The quality of your sleep.  How you feel the next day. This information will help your caregiver care for you.  Get out of bed if you are still awake after 15 minutes. Read or do some quiet activity. Keep the lights down. Wait until you feel sleepy and go back to bed.  Keep regular sleeping and waking hours. Avoid naps.  Exercise regularly.  Avoid distractions at bedtime. Distractions include watching television or engaging in any intense or detailed activity like attempting to balance the household checkbook.  Develop a bedtime ritual. Keep a familiar routine of bathing, brushing your teeth, climbing into bed at the same   time each night, listening to soothing music. Routines increase the success of falling to sleep faster.  Use relaxation techniques. This can be using breathing and muscle tension release routines. It can also include visualizing peaceful scenes. You can  also help control troubling or intruding thoughts by keeping your mind occupied with boring or repetitive thoughts like the old concept of counting sheep. You can make it more creative like imagining planting one beautiful flower after another in your backyard garden.  During your day, work to eliminate stress. When this is not possible use some of the previous suggestions to help reduce the anxiety that accompanies stressful situations. MAKE SURE YOU:   Understand these instructions.  Will watch your condition.  Will get help right away if you are not doing well or get worse. Document Released: 03/05/2000 Document Revised: 05/31/2011 Document Reviewed: 04/05/2007 Exit[employer] Patient Information 2015 [employer]. This information is not intended to replace advice given to you by your health care provider. Make sure you discuss any questions you have with your health care provider.  

## 2014-07-01 NOTE — Discharge Summary (Signed)
[facility]  Physician Discharge Summary   Patient ID: Evelyn Murphy MRN: [id number] DOB/AGE: 67/10/1945 67 y.o.  Admit date: 06/29/2014 Discharge date: 07/01/2014  PCP: Walker Kehr, MD  DISCHARGE DIAGNOSES:  Active Problems:   Hyperlipidemia   Depression with anxiety   Asthma   GERD (gastroesophageal reflux disease)   Slurred speech   Unsteady gait   Confusion   TIA (transient ischemic attack)   Altered mental status   RECOMMENDATIONS FOR OUTPATIENT FOLLOW UP: 1. Follow-up with the primary care physician this week. Patient already has an appointment. 2. Discharged on Aspirin. No changes to other medications 3. Consider sleep study as outpatient. See below.  DISCHARGE CONDITION: good  Diet recommendation: Heart healthy  Filed Weights   06/29/14 1654  Weight: 66.5 kg (146 lb 9.7 oz)    INITIAL HISTORY: 69 year old Caucasian female presented with unsteady K8. Slurred speech and confusion. There was a concern about acute stroke and hence she was admitted for further management.  Consultations:  Neurology  Procedures: 2-D echocardiogram Study Conclusions - Left ventricle: The cavity size was normal. Wall thickness wasnormal. Systolic function was normal. The estimated ejectionfraction was in the range of 55% to 60%. Wall motion was normal;there were no regional wall motion abnormalities. Dopplerparameters are consistent with abnormal left ventricularrelaxation (grade 1 diastolic dysfunction). - Aortic valve: There was no stenosis. There was trivialregurgitation. - Mitral valve: There was trivial regurgitation. - Left atrium: The atrium was mildly dilated. - Right ventricle: The cavity size was normal. Systolic functionwas normal. - Pulmonary arteries: No complete TR doppler jet so unable toestimate PA systolic pressure. Impressions: - Normal LV size and systolic function, EF 15-40%. Normal RV sizeand systolic function. No significant valvular  abnormalities.  Carotid Doppler Findings suggest 1-39% internal carotid artery stenosis bilaterally. Vertebral arteries are patent with antegrade flow.  HOSPITAL COURSE:   Slurred speech, unsteady gait and confusion Initially there was a concern about stroke. MRI was abnormal with an 80 of concern in the posterior limb of the right internal capsule. But the abnormality was thought to be an artifact. Patient admitted that she took multiple medications, all of which had sedating effects (combination of Ambien, nortriptyline, TheraFlu, NyQuil). And she took them close together. She did not have any focal deficits per se. Her symptoms resolved over the course of the evening and night. She was seen by neurology. She completed the TIA/stroke workup which was unremarkable. Discussed with neurologist, Dr. Doy Mince this morning. She has reviewed the patient's chart and feels that this was unlikely to be a neurovascular event. She has cleared the patient for discharge. Patient will follow-up with her PCP in a few days time. Echocardiogram and carotid Dopplers as above. She was seen by physical therapy and was cleared by them. Neurology did recommend a baby aspirin, which will be prescribed.   Hyperlipidemia Continue Zocor and zetia. Lipid panel reviewed. LDL 58.  History of Depression with anxiety Mood appears to be stable. Will continue the use of Celexa and Pamelor. Does have significant issues with insomnia. She doesn't feel refreshed in the morning. Has excessive daytime somnolence. Husband has also noticed her to be talking in the sleep. I have asked her to discuss this with her PCP. She may benefit from a sleep study.  History of Asthma Appears to be stable.   GERD (gastroesophageal reflux disease) Continue home medications  Patient seen this morning. She feels like she is back to baseline. She wanted to go home. She was considered stable  for discharge.   PERTINENT LABS:  The results of  significant diagnostics from this hospitalization (including imaging, microbiology, ancillary and laboratory) are listed below for reference.    Microbiology: Recent Results (from the past 240 hour(s))  Urine culture     Status: None   Collection Time: 06/29/14 10:58 AM  Result Value Ref Range Status   Specimen Description URINE, CATHETERIZED  Final   Special Requests NONE  Final   Colony Count NO GROWTH Performed at [facility]   Final   Culture NO GROWTH Performed at [facility]   Final   Report Status 06/30/2014 FINAL  Final     Labs: Basic Metabolic Panel:  Recent Labs Lab 06/29/14 1023 06/30/14 0513 07/01/14 0417  NA 137 142 143  K 3.6 3.6 3.4*  CL 98 105 105  CO2 27 30 30   GLUCOSE 130* 99 97  BUN 16 18 16   CREATININE 0.79 0.89 0.83  CALCIUM 9.6 9.0 9.3   Liver Function Tests:  Recent Labs Lab 06/29/14 1023  AST 31  ALT 30  ALKPHOS 111  BILITOT 0.3  PROT 7.4  ALBUMIN 4.2   CBC:  Recent Labs Lab 06/29/14 1023 06/30/14 0513 07/01/14 0417  WBC 7.2 5.3 5.7  NEUTROABS 6.0  --   --   HGB 13.1 11.5* 11.7*  HCT 39.7 36.0 36.6  MCV 88.8 89.1 88.8  PLT 211 225 225   CBG:  Recent Labs Lab 06/30/14 0728 06/30/14 1207 06/30/14 1645 06/30/14 2207 07/01/14 0734  GLUCAP 97 129* 82 176* 101*     IMAGING STUDIES Dg Chest 2 View  06/29/2014   CLINICAL DATA:  Altered mental status  EXAM: CHEST  2 VIEW  COMPARISON:  11/22/2013  FINDINGS: Cardiomediastinal silhouette is stable. No acute infiltrate or pleural effusion. No pulmonary edema. Mild degenerative changes thoracic spine.  IMPRESSION: No active cardiopulmonary disease.   Electronically Signed   By: Lahoma Crocker M.D.   On: 06/29/2014 11:07   Ct Head (brain) Wo Contrast  06/29/2014   CLINICAL DATA:  Last seen normal at 2220 last evening, now with slurred speech, delayed thought process and unsteady gait.  EXAM: CT HEAD WITHOUT CONTRAST  TECHNIQUE: Contiguous axial images were obtained  from the base of the skull through the vertex without intravenous contrast.  COMPARISON:  None.  FINDINGS: Gray-white differentiation is maintained. No CT evidence of acute large territory infarct. No intraparenchymal or extra-axial mass or hemorrhage. Normal size and configuration of the ventricles and basilar cisterns. No midline shift. There is minimal opacification of left anterior ethmoidal air cells. The remaining paranasal sinuses and mastoid air cells are normally aerated. No air-fluid levels. Regional soft tissues appear normal. No displaced calvarial fracture. Regional soft tissues appear normal.  IMPRESSION: Negative noncontrast head CT.   Electronically Signed   By: Sandi Mariscal M.D.   On: 06/29/2014 10:37   Mr Brain Wo Contrast (neuro Protocol)  06/29/2014   CLINICAL DATA:  69 year old female with hyperlipidemia presenting with acute onset of slurred speech this morning. Unsteady gait and slight confusion with weakness. Initial encounter.  EXAM: MRI HEAD WITHOUT CONTRAST  TECHNIQUE: Multiplanar, multiecho pulse sequences of the brain and surrounding structures were obtained without intravenous contrast.  COMPARISON:  06/29/2014 head CT.  No comparison brain MR.  FINDINGS: Patient was moving throughout the examination and fast technique imaging had to be utilized.  No definitive acute infarct identified. One sequence raises possibly of a small acute infarct within the posterior  limb of the right internal capsule but is not confirmed on other sequences and I suspect does not represent an acute infarct. Clinical correlation recommended.  Very mild small vessel disease.  No intracranial hemorrhage.  No hydrocephalus.  No intracranial mass lesion noted on this unenhanced exam.  Major intracranial vascular structures are patent.  Mucosal thickening paranasal sinuses most notable anterior left ethmoid sinus air cell junction with the left frontal sinus.  Cervical medullary junction, pituitary region, pineal  region and orbital structures unremarkable.  IMPRESSION: Patient was moving throughout the examination and fast technique imaging had to be utilized.  No definitive acute infarct identified. One sequence raises possibly of a small acute infarct within the posterior limb of the right internal capsule but is not confirmed on other sequences and I suspect does not represent an acute infarct. Clinical correlation recommended.  Mucosal thickening paranasal sinuses most notable anterior left ethmoid sinus air cell junction with the left frontal sinus.   Electronically Signed   By: Genia Del M.D.   On: 06/29/2014 14:21    DISCHARGE EXAMINATION: Filed Vitals:   06/30/14 0601 06/30/14 1404 06/30/14 2203 07/01/14 0523  BP: 132/70 110/71 123/74 121/70  Pulse: 68 62 71 64  Temp: 98.2 F (36.8 C) 98 F (36.7 C) 98.3 F (36.8 C) 98.2 F (36.8 C)  TempSrc: Oral Oral Oral Oral  Resp: 16 20 20 18   Height:      Weight:      SpO2: 100% 98% 96% 97%   General appearance: alert, cooperative, appears stated age and no distress Resp: clear to auscultation bilaterally Cardio: regular rate and rhythm, S1, S2 normal, no murmur, click, rub or gallop GI: soft, non-tender; bowel sounds normal; no masses,  no organomegaly Neurologic: Alert and oriented X 3, normal strength and tone. Normal symmetric reflexes. Normal coordination and gait  DISPOSITION: Home with husband  Discharge Instructions    Call MD for:  difficulty breathing, headache or visual disturbances    Complete by:  As directed      Call MD for:  extreme fatigue    Complete by:  As directed      Call MD for:  persistant dizziness or light-headedness    Complete by:  As directed      Call MD for:  severe uncontrolled pain    Complete by:  As directed      Call MD for:  temperature >100.4    Complete by:  As directed      Discharge instructions    Complete by:  As directed   You were cared for by a hospitalist during your hospital stay. If you  have any questions about your discharge medications or the care you received while you were in the hospital after you are discharged, you can call the unit and asked to speak with the hospitalist on call if the hospitalist that took care of you is not available. Once you are discharged, your primary care physician will handle any further medical issues. Please note that NO REFILLS for any discharge medications will be authorized once you are discharged, as it is imperative that you return to your primary care physician (or establish a relationship with a primary care physician if you do not have one) for your aftercare needs so that they can reassess your need for medications and monitor your lab values. If you do not have a primary care physician, you can call [phone] for a physician referral.     Increase  activity slowly    Complete by:  As directed            ALLERGIES:  Allergies  Allergen Reactions  . Linzess [Linaclotide]     Too strong  . Lipitor [Atorvastatin]     elev LFTs     Current Discharge Medication List    START taking these medications   Details  aspirin EC 81 MG tablet Take 1 tablet (81 mg total) by mouth daily. Qty: 30 tablet, Refills: 0      CONTINUE these medications which have NOT CHANGED   Details  citalopram (CELEXA) 40 MG tablet Take 1 tablet (40 mg total) by mouth daily. Qty: 90 tablet, Refills: 3    ezetimibe (ZETIA) 10 MG tablet Take 1 tablet (10 mg total) by mouth daily. Qty: 100 tablet, Refills: 3    nortriptyline (PAMELOR) 75 MG capsule Take 1 capsule (75 mg total) by mouth at bedtime. Qty: 90 capsule, Refills: 3    pantoprazole (PROTONIX) 40 MG tablet Take 1 tablet (40 mg total) by mouth daily. Qty: 90 tablet, Refills: 3    simvastatin (ZOCOR) 40 MG tablet TAKE ONE TABLET BY MOUTH AT BEDTIME  Qty: 90 tablet, Refills: 1    zolpidem (AMBIEN) 10 MG tablet Take 1 tablet (10 mg total) by mouth at bedtime. Qty: 90 tablet, Refills: 1        Follow-up Information    Follow up with Walker Kehr, MD. Go on 07/04/2014.   Specialty:  Internal Medicine   Why:  at 09:45am   For Post Hospitalization Follow Up   Contact information:   [place] [id number] [phone]       TOTAL DISCHARGE TIME: 35 minutes  [facility] Pager [phone]  07/01/2014, 12:07 PM

## 2014-07-01 NOTE — Progress Notes (Signed)
UR completed 

## 2014-07-01 NOTE — Progress Notes (Signed)
OT Cancellation Note  Patient Details Name: Evelyn Murphy MRN: [id number] DOB: [birth year]   Cancelled Treatment:    Reason Eval/Treat Not Completed: Other (comment) Spoke with PT who screened for OT and no OT needs. Note pt independent with mobility also.  [place], [place] 07/01/2014, 11:37 AM

## 2014-07-02 ENCOUNTER — Telehealth: Payer: Self-pay | Admitting: *Deleted

## 2014-07-02 LAB — HIV ANTIBODY (ROUTINE TESTING W REFLEX): HIV Screen 4th Generation wRfx: NONREACTIVE

## 2014-07-02 NOTE — Telephone Encounter (Signed)
Transition Care Management Follow-up Telephone Call d/C 07/01/14  How have you been since you were released from the hospital? Pt states she feel little better   Do you understand why you were in the hospital? YES   Do you understand the discharge instrcutions? YES  Items Reviewed:  Medications reviewed: YES  Allergies reviewed: YES  Dietary changes reviewed: NO  Referrals reviewed: No referral needed   Functional Questionnaire:   Activities of Daily Living (ADLs):   She states she are independent in the following: ambulation, bathing and hygiene, feeding, continence, grooming, toileting and dressing States she doesn't really need assistance with the following:    Any transportation issues/concerns?: NO   Any patient concerns? No   Confirmed importance and date/time of follow-up visits scheduled: YES, pt states she already had appt made for 07/04/14 w/Dr. Plotnikov advise to keep appt   Confirmed with patient if condition begins to worsen call PCP or go to the ER.

## 2014-07-04 ENCOUNTER — Encounter: Payer: Self-pay | Admitting: Internal Medicine

## 2014-07-04 ENCOUNTER — Ambulatory Visit (INDEPENDENT_AMBULATORY_CARE_PROVIDER_SITE_OTHER): Payer: Medicare Other | Admitting: Internal Medicine

## 2014-07-04 VITALS — BP 130/90 | HR 84

## 2014-07-04 DIAGNOSIS — K5901 Slow transit constipation: Secondary | ICD-10-CM | POA: Diagnosis not present

## 2014-07-04 DIAGNOSIS — R410 Disorientation, unspecified: Secondary | ICD-10-CM | POA: Diagnosis not present

## 2014-07-04 DIAGNOSIS — K5909 Other constipation: Secondary | ICD-10-CM | POA: Insufficient documentation

## 2014-07-04 DIAGNOSIS — E785 Hyperlipidemia, unspecified: Secondary | ICD-10-CM | POA: Diagnosis not present

## 2014-07-04 DIAGNOSIS — G4700 Insomnia, unspecified: Secondary | ICD-10-CM

## 2014-07-04 MED ORDER — AZITHROMYCIN 250 MG PO TABS: ORAL_TABLET | ORAL | Status: DC

## 2014-07-04 MED ORDER — LUBIPROSTONE 24 MCG PO CAPS: 24.0000 ug | ORAL_CAPSULE | Freq: Two times a day (BID) | ORAL | Status: DC

## 2014-07-04 NOTE — Assessment & Plan Note (Signed)
4/15 cold and sleep meds unintentional overdose/interaction

## 2014-07-04 NOTE — Progress Notes (Signed)
Pre visit review using our clinic review tool, if applicable. No additional management support is needed unless otherwise documented below in the visit note. 

## 2014-07-04 NOTE — Addendum Note (Signed)
Addended by: Cassandria Anger on: 07/04/2014 07:31 PM   Modules accepted: Level of Service

## 2014-07-04 NOTE — Progress Notes (Signed)
Subjective:    HPI   Post-hosp f/u:   Admit date: 06/29/2014 Discharge date: 07/01/2014  PCP: Walker Kehr, MD  DISCHARGE DIAGNOSES:  Active Problems:  Hyperlipidemia  Depression with anxiety  Asthma  GERD (gastroesophageal reflux disease)  Slurred speech  Unsteady gait  Confusion  TIA (transient ischemic attack)  Altered mental status   RECOMMENDATIONS FOR OUTPATIENT FOLLOW UP: 1. Follow-up with the primary care physician this week. Patient already has an appointment. 2. Discharged on Aspirin. No changes to other medications 3. Consider sleep study as outpatient. See below.  DISCHARGE CONDITION: good  Diet recommendation: Heart healthy  Filed Weights   06/29/14 1654  Weight: 66.5 kg (146 lb 9.7 oz)    INITIAL HISTORY: 69 year old Caucasian female presented with unsteady K8. Slurred speech and confusion. There was a concern about acute stroke and hence she was admitted for further management.  Consultations:  Neurology  Procedures: 2-D echocardiogram Study Conclusions - Left ventricle: The cavity size was normal. Wall thickness wasnormal. Systolic function was normal. The estimated ejectionfraction was in the range of 55% to 60%. Wall motion was normal;there were no regional wall motion abnormalities. Dopplerparameters are consistent with abnormal left ventricularrelaxation (grade 1 diastolic dysfunction). - Aortic valve: There was no stenosis. There was trivialregurgitation. - Mitral valve: There was trivial regurgitation. - Left atrium: The atrium was mildly dilated. - Right ventricle: The cavity size was normal. Systolic functionwas normal. - Pulmonary arteries: No complete TR doppler jet so unable toestimate PA systolic pressure. Impressions: - Normal LV size and systolic function, EF 00-93%. Normal RV sizeand systolic function. No significant valvular abnormalities.  Carotid Doppler Findings suggest 1-39% internal carotid  artery stenosis bilaterally. Vertebral arteries are patent with antegrade flow.  HOSPITAL COURSE:   Slurred speech, unsteady gait and confusion Initially there was a concern about stroke. MRI was abnormal with an 80 of concern in the posterior limb of the right internal capsule. But the abnormality was thought to be an artifact. Patient admitted that she took multiple medications, all of which had sedating effects (combination of Ambien, nortriptyline, TheraFlu, NyQuil). And she took them close together. She did not have any focal deficits per se. Her symptoms resolved over the course of the evening and night. She was seen by neurology. She completed the TIA/stroke workup which was unremarkable. Discussed with neurologist, Dr. Doy Mince this morning. She has reviewed the patient's chart and feels that this was unlikely to be a neurovascular event. She has cleared the patient for discharge. Patient will follow-up with her PCP in a few days time. Echocardiogram and carotid Dopplers as above. She was seen by physical therapy and was cleared by them. Neurology did recommend a baby aspirin, which will be prescribed.   Hyperlipidemia Continue Zocor and zetia. Lipid panel reviewed. LDL 58.  History of Depression with anxiety Mood appears to be stable. Will continue the use of Celexa and Pamelor. Does have significant issues with insomnia. She doesn't feel refreshed in the morning. Has excessive daytime somnolence. Husband has also noticed her to be talking in the sleep. I have asked her to discuss this with her PCP. She may benefit from a sleep study.  History of Asthma Appears to be stable.   GERD (gastroesophageal reflux disease) Continue home medications  Patient seen this morning. She feels like she is back to baseline. She wanted to go home. She was considered stable for discharge.        The patient needs to address  chronic hypertension  that has been well controlled with medicines; to address  chronic  hyperlipidemia controlled with medicines as well - now on simvastatin due to LFT elevation; and to address insomnia  F/u rice and bread get stuck x long time (Has had it in the past) - PPI helped a lot  Wt Readings from Last 3 Encounters:  06/29/14 146 lb 9.7 oz (66.5 kg)  12/20/13 140 lb (63.504 kg)  07/10/12 140 lb (63.504 kg)   BP Readings from Last 3 Encounters:  07/04/14 130/90  07/01/14 121/70  03/20/14 110/74      Review of Systems  Constitutional: Negative for chills, activity change, appetite change, fatigue and unexpected weight change.  HENT: Negative for congestion, dental problem, ear pain, hearing loss, mouth sores, postnasal drip, sinus pressure, sneezing, sore throat and voice change.   Eyes: Negative for pain and visual disturbance.  Respiratory: Negative for chest tightness, wheezing and stridor.   Cardiovascular: Negative for leg swelling.  Gastrointestinal: Negative for blood in stool, abdominal distention and rectal pain.  Genitourinary: Negative for dysuria, hematuria, decreased urine volume, vaginal bleeding, vaginal discharge, difficulty urinating, vaginal pain and menstrual problem.  Musculoskeletal: Negative for joint swelling, gait problem and neck pain.  Skin: Negative for color change, rash and wound.  Neurological: Negative for tremors, syncope, speech difficulty and light-headedness.  Hematological: Negative for adenopathy.  Psychiatric/Behavioral: Positive for sleep disturbance. Negative for suicidal ideas, hallucinations, behavioral problems, confusion, dysphoric mood and decreased concentration. The patient is not hyperactive.        Objective:   Physical Exam  Constitutional: She appears well-developed. No distress.  HENT:  Head: Normocephalic.  Right Ear: External ear normal.  Left Ear: External ear normal.  Nose: Nose normal.  Mouth/Throat: Oropharynx is clear and moist.  Eyes: Conjunctivae are normal. Pupils are equal, round,  and reactive to light. Right eye exhibits no discharge. Left eye exhibits no discharge.  Neck: Normal range of motion. Neck supple. No JVD present. No tracheal deviation present. No thyromegaly present.  Cardiovascular: Normal rate, regular rhythm and normal heart sounds.   Pulmonary/Chest: No stridor. No respiratory distress. She has no wheezes.  Abdominal: Soft. Bowel sounds are normal. She exhibits no distension and no mass. There is no tenderness. There is no rebound and no guarding.  Musculoskeletal: She exhibits no edema or tenderness.  Lymphadenopathy:    She has no cervical adenopathy.  Neurological: She displays normal reflexes. No cranial nerve deficit. She exhibits normal muscle tone. Coordination normal.  Skin: No rash noted. No erythema.  Psychiatric: She has a normal mood and affect. Her behavior is normal. Judgment and thought content normal.  L shoulder is tender w/ROM  Lab Results  Component Value Date   WBC 5.7 07/01/2014   HGB 11.7* 07/01/2014   HCT 36.6 07/01/2014   PLT 225 07/01/2014   GLUCOSE 97 07/01/2014   CHOL 144 06/30/2014   TRIG 143 06/30/2014   HDL 57 06/30/2014   LDLDIRECT 135.5 01/24/2013   LDLCALC 58 06/30/2014   ALT 30 06/29/2014   AST 31 06/29/2014   NA 143 07/01/2014   K 3.4* 07/01/2014   CL 105 07/01/2014   CREATININE 0.83 07/01/2014   BUN 16 07/01/2014   CO2 30 07/01/2014   TSH 2.014 06/29/2014   INR 0.96 06/29/2014   HGBA1C 6.2* 06/29/2014           Assessment & Plan:

## 2014-07-04 NOTE — Assessment & Plan Note (Addendum)
Sleep disorder. Neurol ref Dr Brett Fairy Pamelor qhs Ambien prn - rare

## 2014-07-04 NOTE — Assessment & Plan Note (Signed)
Start Amitiza. 

## 2014-07-04 NOTE — Addendum Note (Signed)
Addended by: Cassandria Anger on: 07/04/2014 08:51 PM   Modules accepted: Orders

## 2014-07-04 NOTE — Assessment & Plan Note (Signed)
On Simvastatin 

## 2014-07-22 ENCOUNTER — Ambulatory Visit (INDEPENDENT_AMBULATORY_CARE_PROVIDER_SITE_OTHER)
Admission: RE | Admit: 2014-07-22 | Discharge: 2014-07-22 | Disposition: A | Discharge status: Home / Self Care | Payer: Medicare Other | Source: Ambulatory Visit | Attending: Family Medicine | Admitting: Family Medicine

## 2014-07-22 ENCOUNTER — Ambulatory Visit (INDEPENDENT_AMBULATORY_CARE_PROVIDER_SITE_OTHER): Payer: Medicare Other | Admitting: Family Medicine

## 2014-07-22 ENCOUNTER — Other Ambulatory Visit (INDEPENDENT_AMBULATORY_CARE_PROVIDER_SITE_OTHER): Payer: Medicare Other

## 2014-07-22 ENCOUNTER — Encounter: Payer: Self-pay | Admitting: Family Medicine

## 2014-07-22 VITALS — BP 108/80 | HR 83 | Ht 65.0 in

## 2014-07-22 DIAGNOSIS — M79605 Pain in left leg: Secondary | ICD-10-CM | POA: Diagnosis not present

## 2014-07-22 DIAGNOSIS — M25552 Pain in left hip: Secondary | ICD-10-CM | POA: Diagnosis not present

## 2014-07-22 DIAGNOSIS — M769 Unspecified enthesopathy, lower limb, excluding foot: Secondary | ICD-10-CM | POA: Diagnosis not present

## 2014-07-22 DIAGNOSIS — M76899 Other specified enthesopathies of unspecified lower limb, excluding foot: Secondary | ICD-10-CM | POA: Insufficient documentation

## 2014-07-22 NOTE — Progress Notes (Signed)
Pre visit review using our clinic review tool, if applicable. No additional management support is needed unless otherwise documented below in the visit note. 

## 2014-07-22 NOTE — Patient Instructions (Signed)
Good to see you.  Ice 20 minutes 2 times daily. Usually after activity and before bed. Exercises 3 times a week or after working out Compression sleeve to thigh (Dicks or omega sports) and wear with a lot of activity Try pennsaid twice daily as needed Ibuprofen 600mg  3 times a day for 3 days.  Vitamin D 2000IU daily See me again in 3 weeks.

## 2014-07-22 NOTE — Assessment & Plan Note (Signed)
Patient does have more of a tendinitis noted of the quadricep. We discussed home exercises, compression, icing. Patient did work with Product/process development scientist today. We discussed topical anti-inflammatories and over-the-counter natural supplementations. Patient did get x-rays to rule out any bony abnormality but I think that this will likely be normal. Patient and will come back and see me again in 3 weeks. Continuing to have difficulty we'll need to consider formal physical therapy first [facility].

## 2014-07-22 NOTE — Progress Notes (Signed)
[facility] [address] [place], [place] [address] Phone: [phone] Subjective:    I'm seeing this patient by the request  of:  Walker Kehr, MD   CC: Groin pain  IOX:[id number] Evelyn Murphy is a 69 y.o. female coming in with complaint of groin pain. This is mostly on the left side. Patient states that she has had this pain for couple months. Patient states that she has never no radiation injury. Patient states it seemed to start actually when she was more on a workout routine or on a treadmill seem to be giving her the most discomfort. Patient states now it seems to be even with any type of walking. Patient states that this has been going on to long period starting to give her some difficulty when she is changing her activities. Patient has not been working out for the last week. Rates the severity of pain a 7 out of 10 when it occurs. Denies any groin pain associated with it. Denies any radiation down the leg or any numbness or weakness. Patient states it does respond to anti-inflammatories.     Past Medical History  Diagnosis Date  . Hyperlipidemia   . Anxiety   . Insomnia   . RBBB    Past Surgical History  Procedure Laterality Date  . Shoulder arthroscopy  10/2011    R Dr Meyer Cory  . Colonoscopy     History  Substance Use Topics  . Smoking status: Never Smoker   . Smokeless tobacco: Never Used  . Alcohol Use: No   Allergies  Allergen Reactions  . Linzess [Linaclotide]     Too strong  . Lipitor [Atorvastatin]     elev LFTs   Current Outpatient Prescriptions on File Prior to Visit  Medication Sig Dispense Refill  . aspirin EC 81 MG tablet Take 1 tablet (81 mg total) by mouth daily. 30 tablet 0  . azithromycin (ZITHROMAX) 250 MG tablet As directed 6 tablet 0  . citalopram (CELEXA) 40 MG tablet Take 1 tablet (40 mg total) by mouth daily. 90 tablet 3  . ezetimibe (ZETIA) 10 MG tablet Take 1 tablet (10 mg total) by mouth daily. (Patient  taking differently: Take 10 mg by mouth daily. Pt states that she takes a half tablet daily.) 100 tablet 3  . lubiprostone (AMITIZA) 24 MCG capsule Take 1 capsule (24 mcg total) by mouth 2 (two) times daily with a meal. 60 capsule 11  . nortriptyline (PAMELOR) 75 MG capsule Take 1 capsule (75 mg total) by mouth at bedtime. 90 capsule 3  . pantoprazole (PROTONIX) 40 MG tablet Take 1 tablet (40 mg total) by mouth daily. 90 tablet 3  . simvastatin (ZOCOR) 40 MG tablet TAKE ONE TABLET BY MOUTH AT BEDTIME  90 tablet 1  . zolpidem (AMBIEN) 10 MG tablet Take 1 tablet (10 mg total) by mouth at bedtime. (Patient taking differently: Take 10 mg by mouth at bedtime. Pt states that she takes a half tablet at bedtime.) 90 tablet 1   No current facility-administered medications on file prior to visit.   Family History  Problem Relation Age of Onset  . Heart disease Mother 75    CABG  . Diabetes Mother   . Heart disease Father     Father died of MI at 77  . COPD Father   . Heart disease Brother   . Diabetes Brother    History  Substance Use Topics  . Smoking status: Never  Smoker   . Smokeless tobacco: Never Used  . Alcohol Use: No     Past medical history, social, surgical and family history all reviewed in electronic medical record.   Review of Systems: No headache, visual changes, nausea, vomiting, diarrhea, constipation, dizziness, abdominal pain, skin rash, fevers, chills, night sweats, weight loss, swollen lymph nodes, body aches, joint swelling, muscle aches, chest pain, shortness of breath, mood changes.   Objective Blood pressure 108/80, pulse 83, height 5\' 5"  (1.651 m), SpO2 95 %.  General: No apparent distress alert and oriented x3 mood and affect normal, dressed appropriately.  HEENT: Pupils equal, extraocular movements intact  Respiratory: Patient's speak in full sentences and does not appear short of breath  Cardiovascular: No lower extremity edema, non tender, no erythema  Skin:  Warm dry intact with no signs of infection or rash on extremities or on axial skeleton.  Abdomen: Soft nontender  Neuro: Cranial nerves II through XII are intact, neurovascularly intact in all extremities with 2+ DTRs and 2+ pulses.  Lymph: No lymphadenopathy of posterior or anterior cervical chain or axillae bilaterally.  Gait normal with good balance and coordination.  MSK:  Non tender with full range of motion and good stability and symmetric strength and tone of shoulders, elbows, wrist, knee and ankles bilaterally.  Hip: Left ROM IR: 25 Deg, ER: 45 Deg, Flexion: 120 Deg, Extension: 100 Deg, Abduction: 45 Deg, Adduction: 45 Deg Strength IR: 5/5, ER: 5/5, Flexion: 4/5 with discomfort against resistance, Extension: 5/5, Abduction: 5/5, Adduction: 5/5 Pelvic alignment unremarkable to inspection and palpation. Standing hip rotation and gait without trendelenburg sign / unsteadiness. Greater trochanter without tenderness to palpation. No tenderness over piriformis and greater trochanter. No pain with internal rotation or external rotation. No SI joint tenderness and normal minimal SI movement. Negative fulcrum test  MSK US performed of: Left This study was ordered, performed, and interpreted by Charlann Boxer D.O.  Hip: Trochanteric bursa without swelling or effusion. Acetabular labrum visualized and without tears, displacement, or effusion in joint. Quadriceps near origin does have chronic changes with hypoechoic changes. Patient may also have a small muscle tear at the tendon muscular junction. Femoral neck appears unremarkable without increased power doppler signal along Cortex.  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  Procedure note 22449; 15 minutes spent for Therapeutic exercises as stated in above notes.  This included exercises focusing on stretching, strengthening, with significant focus on eccentric aspects. Quadriceps stretching as well as strengthening was  noted with flexion and extension exercises including wall sits as well as biking. Discuss crosstraining.  Proper technique shown and discussed handout in great detail with ATC.  All questions were discussed and answered.     Impression and Recommendations:     This case required medical decision making of moderate complexity.

## 2014-07-23 DIAGNOSIS — M810 Age-related osteoporosis without current pathological fracture: Secondary | ICD-10-CM | POA: Diagnosis not present

## 2014-07-30 ENCOUNTER — Encounter: Payer: Self-pay | Admitting: Internal Medicine

## 2014-07-30 ENCOUNTER — Ambulatory Visit (INDEPENDENT_AMBULATORY_CARE_PROVIDER_SITE_OTHER): Payer: Medicare Other | Admitting: Internal Medicine

## 2014-07-30 VITALS — BP 124/82 | HR 81 | Temp 98.6°F | Ht 65.0 in

## 2014-07-30 DIAGNOSIS — R05 Cough: Secondary | ICD-10-CM

## 2014-07-30 DIAGNOSIS — R059 Cough, unspecified: Secondary | ICD-10-CM

## 2014-07-30 DIAGNOSIS — J310 Chronic rhinitis: Secondary | ICD-10-CM

## 2014-07-30 DIAGNOSIS — H6123 Impacted cerumen, bilateral: Secondary | ICD-10-CM | POA: Diagnosis not present

## 2014-07-30 NOTE — Progress Notes (Signed)
   Subjective:    Patient ID: Evelyn Murphy, female    DOB: 1945/10/10, 69 y.o.   MRN: [id number]  HPI She describes a cough which begins as a "tickle" in the palate. This has been present for 2 weeks following a respiratory tract infection. This can last seconds-minutes. She's been using lozenges without significant response.  She does not smoke; has no history of asthma; denies reflux; and denies any symptoms of an upper history tract infection or extrinsic process otherwise @ present.  She has been found to have a mildly elevated parathyroid hormone level with normal calcium. She has a pending appointment with endocrinologist   Review of Systems Frontal headache, facial pain , nasal purulence, dental pain, sore throat , otic pain or otic discharge denied. No fever , chills or sweats. Extrinsic symptoms of itchy, watery eyes, sneezing, or angioedema are denied. There is no  sputum production, wheezing,or  paroxysmal nocturnal dyspnea. Unexplained weight loss, abdominal pain, significant dyspepsia, dysphagia, melena, rectal bleeding, or persistently small caliber stools are denied.    Objective:   Physical Exam General appearance:Adequately nourished; no acute distress or increased work of breathing is present.   BMI:  Lymphatic: No  lymphadenopathy about the head, neck, or axilla .  Eyes: No conjunctival inflammation or lid edema is present. There is no scleral icterus.  Ears:  External ear exam shows no significant lesions or deformities.  Otoscopic examination reveals bilateral wax impactions  Nose:  External nasal examination shows no deformity or inflammation. Nasal mucosa are erythematous without lesions or exudates No septal dislocation or deviation.No obstruction to airflow.   Oral exam: Dental hygiene is good; lips and gums are healthy appearing.There is no oropharyngeal erythema or exudate .  Neck:  No deformities, thyromegaly, masses, or tenderness noted.   Supple with  full range of motion without pain.   Heart:  Normal rate and regular rhythm. S1 accentuated; S2 normal without gallop, murmur, click, rub or other extra sounds.   Lungs:Chest clear to auscultation; RARE isolated wheeze. Extremities:  No cyanosis, edema, or clubbing  noted    Skin: Warm & dry w/o tenting or jaundice. No significant lesions or rash.        Assessment & Plan:  #1 cough most likely related to nonallergic rhinitis versus reflux  #2 cerumen impactions  #3 elevated PTH with normal calcium  Plan: See after visit summary

## 2014-07-30 NOTE — Progress Notes (Signed)
Pre visit review using our clinic review tool, if applicable. No additional management support is needed unless otherwise documented below in the visit note. 

## 2014-07-30 NOTE — Patient Instructions (Signed)
Plain Mucinex (NOT D) for thick secretions ;force NON dairy fluids .   Nasal cleansing in the shower as discussed with lather of mild shampoo.After 10 seconds wash off lather while  exhaling through nostrils. Make sure that all residual soap is removed to prevent irritation.  Flonase OR Nasacort AQ 1 spray in each nostril twice a day as needed. Use the "crossover" technique into opposite nostril spraying toward opposite ear @ 45 degree angle, not straight up into nostril.  Plain Allegra (NOT D )  160 daily , Loratidine 10 mg , OR Zyrtec 10 mg @ bedtime  as needed for itchy eyes & sneezing.   Breo one inhalation daily; gargle and spit after use.   Reflux of gastric acid may be asymptomatic as this may occur mainly during sleep.The triggers for reflux  include stress; the "aspirin family" ; alcohol; peppermint; and caffeine (coffee, tea, cola, and chocolate). The aspirin family would include aspirin and the nonsteroidal agents such as ibuprofen &  Naproxen. Tylenol would not cause reflux. If having symptoms ; food & drink should be avoided for @ least 2 hours before going to bed.   Please do not use Q-tips as we discussed. Should wax build up occur, please put 2-3 drops of mineral oil in the affected  ear at night to soften the wax .Cover the canal with a  cotton ball to prevent the oil from staining bed linens. In the morning fill the ear canal with hydrogen peroxide & lie in the opposite lateral decubitus position(on the side opposite the affected ear)  for 10-15 minutes. After allowing this period of time for the peroxide to dissolve the wax ;shower and use the thinnest washrag available to wick out the wax. If both ears are involved ; alternate this treatment from ear to ear each night until no wax is found on the washrag.

## 2014-08-05 ENCOUNTER — Other Ambulatory Visit: Payer: Self-pay | Admitting: Internal Medicine

## 2014-08-12 ENCOUNTER — Other Ambulatory Visit: Payer: Self-pay | Admitting: Internal Medicine

## 2014-08-12 ENCOUNTER — Ambulatory Visit (INDEPENDENT_AMBULATORY_CARE_PROVIDER_SITE_OTHER): Payer: Medicare Other | Admitting: Family Medicine

## 2014-08-12 ENCOUNTER — Other Ambulatory Visit: Payer: Self-pay | Admitting: Surgery

## 2014-08-12 ENCOUNTER — Encounter: Payer: Self-pay | Admitting: Family Medicine

## 2014-08-12 VITALS — BP 104/66 | HR 87 | Ht 65.0 in

## 2014-08-12 DIAGNOSIS — M76899 Other specified enthesopathies of unspecified lower limb, excluding foot: Secondary | ICD-10-CM

## 2014-08-12 DIAGNOSIS — S63619A Unspecified sprain of unspecified finger, initial encounter: Secondary | ICD-10-CM | POA: Diagnosis not present

## 2014-08-12 DIAGNOSIS — M8580 Other specified disorders of bone density and structure, unspecified site: Secondary | ICD-10-CM | POA: Diagnosis not present

## 2014-08-12 DIAGNOSIS — M769 Unspecified enthesopathy, lower limb, excluding foot: Secondary | ICD-10-CM | POA: Diagnosis not present

## 2014-08-12 DIAGNOSIS — E213 Hyperparathyroidism, unspecified: Secondary | ICD-10-CM | POA: Diagnosis not present

## 2014-08-12 NOTE — Assessment & Plan Note (Signed)
Buddy tape and ice

## 2014-08-12 NOTE — Patient Instructions (Addendum)
Good to see you Ice is your friend Continue the compression with exercise Do the stretching after activity for sure.  Try medicine 3 times a day for 3 days.  Tape fingers together day and night for 2 days then nightly for 1 week.  See me again in 3 weeks.

## 2014-08-12 NOTE — Progress Notes (Signed)
[facility] [place], [place] [address] Phone: [phone] Subjective:    CC: Groin pain follow-up  DVV:[id number] Evelyn Murphy is a 69 y.o. female coming in with complaint of groin pain. This is mostly on the left side. Patient was seen previously and had more of what seemed to be a quadriceps tendinitis at its origin. Patient did have x-rays of the left hip which were unremarkable. Patient was to do compression, icing, home exercises. Patient states she is approximately 80% better. Patient states she is having very mild tightness only when she is doing repetitive activity. Nothing that his stopping him from activity. Patient is very happy with the results can do her daily activities and sleeping well regularly. Patient is no longer using the pain medications. Patient is taking the over-the-counter natural supple mentation's we discussed previously.  Patient did fall though recently and try to get herself with the left hand. Patient is having pain on the ring finger. No significant swelling. States that hurts with repetitive activity. No significant weakness or numbness.      Past Medical History  Diagnosis Date  . Hyperlipidemia   . Anxiety   . Insomnia   . RBBB    Past Surgical History  Procedure Laterality Date  . Shoulder arthroscopy  10/2011    R Dr Meyer Cory  . Colonoscopy     History  Substance Use Topics  . Smoking status: Never Smoker   . Smokeless tobacco: Never Used  . Alcohol Use: No   Allergies  Allergen Reactions  . Linzess [Linaclotide]     Too strong  . Lipitor [Atorvastatin]     elev LFTs   Current Outpatient Prescriptions on File Prior to Visit  Medication Sig Dispense Refill  . aspirin EC 81 MG tablet Take 1 tablet (81 mg total) by mouth daily. 30 tablet 0  . citalopram (CELEXA) 40 MG tablet Take 1 tablet (40 mg total) by mouth daily. 90 tablet 3  . ezetimibe (ZETIA) 10 MG tablet Take 1 tablet (10 mg total) by  mouth daily. (Patient taking differently: Take 10 mg by mouth daily. Pt states that she takes a half tablet daily.) 100 tablet 3  . lubiprostone (AMITIZA) 24 MCG capsule Take 1 capsule (24 mcg total) by mouth 2 (two) times daily with a meal. 60 capsule 11  . nortriptyline (PAMELOR) 75 MG capsule TAKE ONE CAPSULE BY MOUTH AT BEDTIME 90 capsule 3  . pantoprazole (PROTONIX) 40 MG tablet Take 1 tablet (40 mg total) by mouth daily. 90 tablet 3  . simvastatin (ZOCOR) 40 MG tablet TAKE ONE TABLET BY MOUTH AT BEDTIME  90 tablet 1  . zolpidem (AMBIEN) 10 MG tablet Take 1 tablet (10 mg total) by mouth at bedtime. (Patient taking differently: Take 10 mg by mouth at bedtime. Pt states that she takes a half tablet at bedtime.) 90 tablet 1   No current facility-administered medications on file prior to visit.   Family History  Problem Relation Age of Onset  . Heart disease Mother 19    CABG  . Diabetes Mother   . Heart disease Father     Father died of MI at 41  . COPD Father   . Heart disease Brother   . Diabetes Brother    History  Substance Use Topics  . Smoking status: Never Smoker   . Smokeless tobacco: Never Used  . Alcohol Use: No     Past medical history, social,  surgical and family history all reviewed in electronic medical record.   Review of Systems: No headache, visual changes, nausea, vomiting, diarrhea, constipation, dizziness, abdominal pain, skin rash, fevers, chills, night sweats, weight loss, swollen lymph nodes, body aches, joint swelling, muscle aches, chest pain, shortness of breath, mood changes.   Objective Blood pressure 104/66, pulse 87, height 5\' 5"  (1.651 m), SpO2 99 %.  General: No apparent distress alert and oriented x3 mood and affect normal, dressed appropriately.  HEENT: Pupils equal, extraocular movements intact  Respiratory: Patient's speak in full sentences and does not appear short of breath  Cardiovascular: No lower extremity edema, non tender, no erythema    Skin: Warm dry intact with no signs of infection or rash on extremities or on axial skeleton.  Abdomen: Soft nontender  Neuro: Cranial nerves II through XII are intact, neurovascularly intact in all extremities with 2+ DTRs and 2+ pulses.  Lymph: No lymphadenopathy of posterior or anterior cervical chain or axillae bilaterally.  Gait normal with good balance and coordination.  MSK:  Non tender with full range of motion and good stability and symmetric strength and tone of shoulders, elbows, wrist, knee and ankles bilaterally.  Hip: Left ROM IR: 25 Deg, ER: 45 Deg, Flexion: 120 Deg, Extension: 100 Deg, Abduction: 45 Deg, Adduction: 45 Deg Strength IR: 5/5, ER: 5/5, Flexion: 4/5 with no discomfort which is an improvement e, Extension: 5/5, Abduction: 5/5, Adduction: 5/5 Pelvic alignment unremarkable to inspection and palpation. Standing hip rotation and gait without trendelenburg sign / unsteadiness. Greater trochanter without tenderness to palpation. No tenderness over piriformis and greater trochanter. No pain with internal rotation or external rotation. No SI joint tenderness and normal minimal SI movement. Negative fulcrum test   Hand exam on the left hand shows the patient's ring finger has some very mild angulation noted but this is fairly similar to the contralateral side. Patient is tender to palpation over the PIP joint. Patient does have full range of motion with no significant angulation noted. Neurovascularly intact distally.    Impression and Recommendations:     This case required medical decision making of moderate complexity.

## 2014-08-12 NOTE — Assessment & Plan Note (Signed)
Patient is improving at this time. Still having some mild discomfort very minimal compared to what it was previously. Patient did have a recent fall but is doing much better as well. Patient will try to make these different changes and will start the home exercises again cut she is doing so well. Patient and will come back and see me again in 3-4 weeks if pain is not completely resolved.

## 2014-08-12 NOTE — Progress Notes (Signed)
Pre visit review using our clinic review tool, if applicable. No additional management support is needed unless otherwise documented below in the visit note. 

## 2014-08-13 ENCOUNTER — Other Ambulatory Visit: Payer: Self-pay | Admitting: Surgery

## 2014-08-13 DIAGNOSIS — E213 Hyperparathyroidism, unspecified: Secondary | ICD-10-CM

## 2014-08-16 DIAGNOSIS — E213 Hyperparathyroidism, unspecified: Secondary | ICD-10-CM | POA: Diagnosis not present

## 2014-09-02 ENCOUNTER — Ambulatory Visit: Payer: Medicare Other | Admitting: Family Medicine

## 2014-09-05 ENCOUNTER — Encounter (HOSPITAL_COMMUNITY): Payer: Medicare Other

## 2014-09-05 ENCOUNTER — Encounter (HOSPITAL_COMMUNITY)
Admission: RE | Admit: 2014-09-05 | Discharge: 2014-09-05 | Disposition: A | Discharge status: Home / Self Care | Payer: Medicare Other | Source: Ambulatory Visit | Attending: Surgery | Admitting: Surgery

## 2014-09-05 DIAGNOSIS — E213 Hyperparathyroidism, unspecified: Secondary | ICD-10-CM | POA: Insufficient documentation

## 2014-09-05 MED ORDER — TECHNETIUM TC 99M SESTAMIBI - CARDIOLITE
26.0000 | Freq: Once | INTRAVENOUS | Status: AC | PRN
  Administered 2014-09-05: 10:00:00 26 via INTRAVENOUS

## 2014-09-13 ENCOUNTER — Other Ambulatory Visit: Payer: Self-pay | Admitting: Surgery

## 2014-09-13 DIAGNOSIS — D351 Benign neoplasm of parathyroid gland: Secondary | ICD-10-CM

## 2014-09-16 ENCOUNTER — Other Ambulatory Visit: Payer: Self-pay | Admitting: Surgery

## 2014-09-16 ENCOUNTER — Other Ambulatory Visit: Payer: Self-pay

## 2014-09-16 DIAGNOSIS — D351 Benign neoplasm of parathyroid gland: Secondary | ICD-10-CM

## 2014-09-18 ENCOUNTER — Other Ambulatory Visit: Payer: Medicare Other

## 2014-09-18 ENCOUNTER — Ambulatory Visit
Admission: RE | Admit: 2014-09-18 | Discharge: 2014-09-18 | Disposition: A | Discharge status: Home / Self Care | Payer: Medicare Other | Source: Ambulatory Visit | Attending: Surgery | Admitting: Surgery

## 2014-09-18 DIAGNOSIS — E042 Nontoxic multinodular goiter: Secondary | ICD-10-CM | POA: Diagnosis not present

## 2014-09-27 ENCOUNTER — Other Ambulatory Visit: Payer: Medicare Other

## 2014-09-27 ENCOUNTER — Other Ambulatory Visit: Payer: Self-pay | Admitting: *Deleted

## 2014-09-27 DIAGNOSIS — Z0000 Encounter for general adult medical examination without abnormal findings: Secondary | ICD-10-CM

## 2014-09-27 DIAGNOSIS — R945 Abnormal results of liver function studies: Secondary | ICD-10-CM

## 2014-09-27 DIAGNOSIS — R7989 Other specified abnormal findings of blood chemistry: Secondary | ICD-10-CM

## 2014-09-27 DIAGNOSIS — E785 Hyperlipidemia, unspecified: Secondary | ICD-10-CM

## 2014-10-03 ENCOUNTER — Other Ambulatory Visit: Payer: Self-pay | Admitting: *Deleted

## 2014-10-03 ENCOUNTER — Other Ambulatory Visit (INDEPENDENT_AMBULATORY_CARE_PROVIDER_SITE_OTHER): Payer: Medicare Other

## 2014-10-03 ENCOUNTER — Encounter: Payer: Self-pay | Admitting: Internal Medicine

## 2014-10-03 ENCOUNTER — Ambulatory Visit (INDEPENDENT_AMBULATORY_CARE_PROVIDER_SITE_OTHER): Payer: Medicare Other | Admitting: Internal Medicine

## 2014-10-03 ENCOUNTER — Ambulatory Visit (INDEPENDENT_AMBULATORY_CARE_PROVIDER_SITE_OTHER)
Admission: RE | Admit: 2014-10-03 | Discharge: 2014-10-03 | Disposition: A | Discharge status: Home / Self Care | Payer: Medicare Other | Source: Ambulatory Visit | Attending: Internal Medicine | Admitting: Internal Medicine

## 2014-10-03 VITALS — BP 126/84 | HR 84

## 2014-10-03 DIAGNOSIS — Z23 Encounter for immunization: Secondary | ICD-10-CM | POA: Diagnosis not present

## 2014-10-03 DIAGNOSIS — M79642 Pain in left hand: Secondary | ICD-10-CM

## 2014-10-03 DIAGNOSIS — G4700 Insomnia, unspecified: Secondary | ICD-10-CM

## 2014-10-03 DIAGNOSIS — F418 Other specified anxiety disorders: Secondary | ICD-10-CM | POA: Diagnosis not present

## 2014-10-03 DIAGNOSIS — M7989 Other specified soft tissue disorders: Secondary | ICD-10-CM | POA: Diagnosis not present

## 2014-10-03 DIAGNOSIS — E785 Hyperlipidemia, unspecified: Secondary | ICD-10-CM | POA: Diagnosis not present

## 2014-10-03 DIAGNOSIS — D6489 Other specified anemias: Secondary | ICD-10-CM

## 2014-10-03 DIAGNOSIS — D649 Anemia, unspecified: Secondary | ICD-10-CM | POA: Insufficient documentation

## 2014-10-03 LAB — CBC WITH DIFFERENTIAL/PLATELET
Basophils Absolute: 0 10*3/uL (ref 0.0–0.1)
Basophils Relative: 0.7 % (ref 0.0–3.0)
Eosinophils Absolute: 0.2 10*3/uL (ref 0.0–0.7)
Eosinophils Relative: 5.2 % — ABNORMAL HIGH (ref 0.0–5.0)
HCT: 39.2 % (ref 36.0–46.0)
HEMOGLOBIN: 13.2 g/dL (ref 12.0–15.0)
Lymphocytes Relative: 34.1 % (ref 12.0–46.0)
Lymphs Abs: 1.6 10*3/uL (ref 0.7–4.0)
MCHC: 33.7 g/dL (ref 30.0–36.0)
MCV: 87.1 fl (ref 78.0–100.0)
MONO ABS: 0.4 10*3/uL (ref 0.1–1.0)
Monocytes Relative: 8.3 % (ref 3.0–12.0)
NEUTROS PCT: 51.7 % (ref 43.0–77.0)
Neutro Abs: 2.4 10*3/uL (ref 1.4–7.7)
PLATELETS: 331 10*3/uL (ref 150.0–400.0)
RBC: 4.51 Mil/uL (ref 3.87–5.11)
RDW: 13.1 % (ref 11.5–15.5)
WBC: 4.7 10*3/uL (ref 4.0–10.5)

## 2014-10-03 LAB — BASIC METABOLIC PANEL
BUN: 18 mg/dL (ref 6–23)
CO2: 29 mEq/L (ref 19–32)
Calcium: 10 mg/dL (ref 8.4–10.5)
Chloride: 101 mEq/L (ref 96–112)
Creatinine, Ser: 0.81 mg/dL (ref 0.40–1.20)
GFR: 74.93 mL/min (ref >60.00)
Glucose, Bld: 108 mg/dL — ABNORMAL HIGH (ref 70–99)
Potassium: 4.4 mEq/L (ref 3.5–5.1)
SODIUM: 138 meq/L (ref 135–145)

## 2014-10-03 LAB — IBC PANEL
Iron: 66 ug/dL (ref 42–145)
SATURATION RATIOS: 19.1 % — AB (ref 20.0–50.0)
Transferrin: 247 mg/dL (ref 212.0–360.0)

## 2014-10-03 MED ORDER — VITAMIN D3 50 MCG (2000 UT) PO CAPS: 2000.0000 [IU] | ORAL_CAPSULE | Freq: Every day | ORAL | Status: DC

## 2014-10-03 MED ORDER — EZETIMIBE 10 MG PO TABS: 10.0000 mg | ORAL_TABLET | Freq: Every day | ORAL | Status: DC

## 2014-10-03 MED ORDER — SIMVASTATIN 40 MG PO TABS: 40.0000 mg | ORAL_TABLET | Freq: Every day | ORAL | Status: DC

## 2014-10-03 MED ORDER — ZOSTER VACCINE LIVE 19400 UNT/0.65ML ~~LOC~~ SOLR: 0.6500 mL | Freq: Once | SUBCUTANEOUS | Status: DC

## 2014-10-03 NOTE — Assessment & Plan Note (Signed)
On Pamelor 

## 2014-10-03 NOTE — Progress Notes (Signed)
Subjective:  Patient ID: Evelyn Murphy, female    DOB: 1945-05-26  Age: 69 y.o. MRN: [id number]  CC: No chief complaint on file.   HPI  Evelyn Murphy presents for a f/u anemia, elev lipids C/o L finger #3-4 stiffness and swelling after a fall in May 2016 Pt is seeing Dr Harlow Asa for a parathyroid problem  Outpatient Prescriptions Prior to Visit  Medication Sig Dispense Refill  . aspirin EC 81 MG tablet Take 1 tablet (81 mg total) by mouth daily. 30 tablet 0  . citalopram (CELEXA) 40 MG tablet Take 1 tablet (40 mg total) by mouth daily. 90 tablet 3  . ezetimibe (ZETIA) 10 MG tablet Take 1 tablet (10 mg total) by mouth daily. (Patient taking differently: Take 10 mg by mouth daily. Pt states that she takes a half tablet daily.) 100 tablet 3  . lubiprostone (AMITIZA) 24 MCG capsule Take 1 capsule (24 mcg total) by mouth 2 (two) times daily with a meal. 60 capsule 11  . nortriptyline (PAMELOR) 75 MG capsule TAKE ONE CAPSULE BY MOUTH AT BEDTIME 90 capsule 3  . pantoprazole (PROTONIX) 40 MG tablet TAKE ONE TABLET BY MOUTH ONE TIME DAILY 90 tablet 1  . simvastatin (ZOCOR) 40 MG tablet TAKE ONE TABLET BY MOUTH AT BEDTIME  90 tablet 1  . zolpidem (AMBIEN) 10 MG tablet Take 1 tablet (10 mg total) by mouth at bedtime. (Patient taking differently: Take 10 mg by mouth at bedtime. Pt states that she takes a half tablet at bedtime.) 90 tablet 1   No facility-administered medications prior to visit.    ROS Review of Systems  Objective:  BP 126/84 mmHg  Pulse 84  SpO2 97%  BP Readings from Last 3 Encounters:  10/03/14 126/84  08/12/14 104/66  07/30/14 124/82    Wt Readings from Last 3 Encounters:  06/29/14 146 lb 9.7 oz (66.5 kg)  12/20/13 140 lb (63.504 kg)  07/10/12 140 lb (63.504 kg)    Physical Exam  Lab Results  Component Value Date   WBC 5.7 07/01/2014   HGB 11.7* 07/01/2014   HCT 36.6 07/01/2014   PLT 225 07/01/2014   GLUCOSE 97 07/01/2014   CHOL 144 06/30/2014   TRIG  143 06/30/2014   HDL 57 06/30/2014   LDLDIRECT 135.5 01/24/2013   LDLCALC 58 06/30/2014   ALT 30 06/29/2014   AST 31 06/29/2014   NA 143 07/01/2014   K 3.4* 07/01/2014   CL 105 07/01/2014   CREATININE 0.83 07/01/2014   BUN 16 07/01/2014   CO2 30 07/01/2014   TSH 2.014 06/29/2014   INR 0.96 06/29/2014   HGBA1C 6.2* 06/29/2014    US Soft Tissue Head/neck  09/18/2014   CLINICAL DATA:  69 year old female with a history of hypercalcemia, question of parathyroid adenoma.  Reason nuclear medicine study demonstrated no evidence of parathyroid adenoma  EXAM: THYROID ULTRASOUND  TECHNIQUE: Ultrasound examination of the thyroid gland and adjacent soft tissues was performed.  COMPARISON:  Nuclear medicine parathyroid study 09/05/2014  FINDINGS: Right thyroid lobe  Measurements: 4.0 cm x 1.1 cm x 1.8 cm. Heterogeneous appearance of the right thyroid tissue. 2 small hypoechoic lesions within the right thyroid tissue, both of which measure less than 4 mm.  There is a heterogeneously echoic nodule inferior to the right thyroid which measures 8 mm x 4 mm x 6 mm.  Left thyroid lobe  Measurements: 4.3 cm x 1.1 cm x 1.4 cm. Dominant nodule within the left thyroid  measures 1.3 cm x 7 mm x 1.2 cm. Multiple internal reflectors compatible with calcium or colloid. Additional small nodule at the lower pole of the left thyroid measures 1.0 cm x 4 mm x 6 mm with solid features.  Isthmus  Thickness: 0.2 cm.  No nodules visualized.  Lymphadenopathy  None visualized.  IMPRESSION: Multinodular thyroid with the dominant nodule on the left measuring 1.3 cm and meeting criteria for biopsy.  Findings meet consensus criteria for biopsy. Ultrasound-guided fine needle aspiration should be considered, as per the consensus statement: Management of Thyroid Nodules Detected at [place]: Society of Radiologists in [place]. Radiology 2005; [id number].  Heterogeneously echoic nodule measuring as great is 8 mm  inferior and separate from the right thyroid. Differential diagnosis includes parathyroid gland/adenoma (false-negative rate for nuclear medicine parathyroid studies has been cited in some studies to be approximately 20%), or a lymph node. If further imaging evaluation is warranted, contrast-enhanced neck CT may be considered.  Signed,  Dulcy Fanny. Earleen Newport, DO  [facility]  [facility]   Electronically Signed   By: Corrie Mckusick D.O.   On: 09/18/2014 11:19    Assessment & Plan:   There are no diagnoses linked to this encounter. I am having Evelyn Murphy maintain her ezetimibe, citalopram, zolpidem, simvastatin, aspirin EC, lubiprostone, nortriptyline, and pantoprazole.  No orders of the defined types were placed in this encounter.     Follow-up: No Follow-up on file.  Walker Kehr, MD

## 2014-10-03 NOTE — Progress Notes (Signed)
Pre visit review using our clinic review tool, if applicable. No additional management support is needed unless otherwise documented below in the visit note. 

## 2014-10-03 NOTE — Assessment & Plan Note (Signed)
On Pamelor Off Zolpidem

## 2014-10-03 NOTE — Assessment & Plan Note (Addendum)
Chronic  On Simvastatin and Zetia Labs

## 2014-10-03 NOTE — Assessment & Plan Note (Signed)
Labs

## 2014-10-15 ENCOUNTER — Ambulatory Visit (INDEPENDENT_AMBULATORY_CARE_PROVIDER_SITE_OTHER): Payer: Medicare Other | Admitting: Family Medicine

## 2014-10-15 ENCOUNTER — Encounter: Payer: Self-pay | Admitting: Family Medicine

## 2014-10-15 ENCOUNTER — Other Ambulatory Visit (INDEPENDENT_AMBULATORY_CARE_PROVIDER_SITE_OTHER): Payer: Medicare Other

## 2014-10-15 VITALS — BP 110/80 | HR 78

## 2014-10-15 DIAGNOSIS — M79645 Pain in left finger(s): Secondary | ICD-10-CM

## 2014-10-15 DIAGNOSIS — S62619A Displaced fracture of proximal phalanx of unspecified finger, initial encounter for closed fracture: Secondary | ICD-10-CM

## 2014-10-15 NOTE — Progress Notes (Signed)
Pre visit review using our clinic review tool, if applicable. No additional management support is needed unless otherwise documented below in the visit note. 

## 2014-10-15 NOTE — Assessment & Plan Note (Signed)
Splinted finger today with buddy taping. We discussed icing regimen. We discussed topical anti-inflammatory's. Patient will come back again in 4 weeks for further evaluation. Patient will increase her vitamin D supplementation.

## 2014-10-15 NOTE — Patient Instructions (Signed)
Good to see you Up the vitamin D to 4000 IU daily for 2 weeks Buddy tape as much as possible for 2 weeks See me in 4 weeks if not completely healed.

## 2014-10-15 NOTE — Progress Notes (Signed)
[facility] [place], [place] [address] Phone: [phone] Subjective:     CC: finger fracture  [id number] Evelyn Murphy is a 69 y.o. female coming in with complaint of finger pain. Patient was having finger pain for 2 months after a fall. Patient attempted to catch herself and had more of a finger sprain. Patient did have an x-ray that showed a possibility for an avulsion fracture. Patient states that it is uncomfortable. This is on her left hand and she is left-hand-dominant. Patient denies any numbness or tingling but states that she notices the discomfort with her daily activities. Nothing that wakes her up at night. No significant swelling but states that it no longer is able to put rings on this finger secondary to the joint change. Patient rates the discomfort as 4 out of 10.  Past Medical History  Diagnosis Date  . Hyperlipidemia   . Anxiety   . Insomnia   . RBBB    Past Surgical History  Procedure Laterality Date  . Shoulder arthroscopy  10/2011    R Dr Meyer Cory  . Colonoscopy     History  Substance Use Topics  . Smoking status: Never Smoker   . Smokeless tobacco: Never Used  . Alcohol Use: No   Allergies  Allergen Reactions  . Linzess [Linaclotide]     Too strong  . Lipitor [Atorvastatin]     elev LFTs   Family History  Problem Relation Age of Onset  . Heart disease Mother 20    CABG  . Diabetes Mother   . Heart disease Father     Father died of MI at 12  . COPD Father   . Heart disease Brother   . Diabetes Brother         Past medical history, social, surgical and family history all reviewed in electronic medical record.   Review of Systems: No headache, visual changes, nausea, vomiting, diarrhea, constipation, dizziness, abdominal pain, skin rash, fevers, chills, night sweats, weight loss, swollen lymph nodes, body aches, joint swelling, muscle aches, chest pain, shortness of breath, mood changes.    Objective Blood pressure 110/80, pulse 78, SpO2 96 %.  General: No apparent distress alert and oriented x3 mood and affect normal, dressed appropriately.  HEENT: Pupils equal, extraocular movements intact  Respiratory: Patient's speak in full sentences and does not appear short of breath  Cardiovascular: No lower extremity edema, non tender, no erythema  Skin: Warm dry intact with no signs of infection or rash on extremities or on axial skeleton.  Abdomen: Soft nontender  Neuro: Cranial nerves II through XII are intact, neurovascularly intact in all extremities with 2+ DTRs and 2+ pulses.  Lymph: No lymphadenopathy of posterior or anterior cervical chain or axillae bilaterally.  Gait normal with good balance and coordination.  MSK:  Non tender with full range of motion and good stability and symmetric strength and tone of shoulders, elbows, wrist, hip, knee and ankles bilaterally.   Left hand exam shows the patient does have some mild ulnar deviation of the third finger at the PIP joint. Minimally tender to palpation in this area mostly on the dorsal aspect. Neurovascularly intact distally with full range of motion of fingers. No crepitus noted.  Limited musculoskeletal ultrasound was performed and interpreted by Hulan Saas, M  Limited ultrasound of the third joint shows the patient did just proximal to this area have a small avulsion fracture that is within healing. Callus formation noted.  Increasing Doppler flow with mild hypoechoic changes.  Impression: Third middle phalanx fracture with interval healing    Impression and Recommendations:     This case required medical decision making of moderate complexity.

## 2014-11-12 ENCOUNTER — Ambulatory Visit: Payer: Medicare Other | Admitting: Family Medicine

## 2014-11-27 ENCOUNTER — Encounter: Payer: Self-pay | Admitting: Internal Medicine

## 2014-11-27 ENCOUNTER — Ambulatory Visit (INDEPENDENT_AMBULATORY_CARE_PROVIDER_SITE_OTHER): Payer: Medicare Other | Admitting: Internal Medicine

## 2014-11-27 VITALS — BP 108/70 | HR 81 | Temp 98.6°F

## 2014-11-27 DIAGNOSIS — R05 Cough: Secondary | ICD-10-CM

## 2014-11-27 DIAGNOSIS — R059 Cough, unspecified: Secondary | ICD-10-CM

## 2014-11-27 MED ORDER — FLUTICASONE-SALMETEROL 100-50 MCG/DOSE IN AEPB: 1.0000 | INHALATION_SPRAY | Freq: Two times a day (BID) | RESPIRATORY_TRACT | Status: DC

## 2014-11-27 MED ORDER — PROMETHAZINE-CODEINE 6.25-10 MG/5ML PO SYRP: 5.0000 mL | ORAL_SOLUTION | ORAL | Status: DC | PRN

## 2014-11-27 NOTE — Assessment & Plan Note (Addendum)
Asthmatic cough Prom-cod syr Rx prn Advair prn - sample Call if not better

## 2014-11-27 NOTE — Progress Notes (Signed)
Subjective:  Patient ID: [name], female    DOB: 1946-03-14  Age: 69 y.o. MRN: [id number]  CC: Cough   HPI ZAELYN NOACK presents for cough x 3 d  Outpatient Prescriptions Prior to Visit  Medication Sig Dispense Refill  . aspirin EC 81 MG tablet Take 1 tablet (81 mg total) by mouth daily. 30 tablet 0  . Cholecalciferol (VITAMIN D3) 2000 UNITS capsule Take 1 capsule (2,000 Units total) by mouth daily. 100 capsule 3  . citalopram (CELEXA) 40 MG tablet Take 1 tablet (40 mg total) by mouth daily. 90 tablet 3  . ezetimibe (ZETIA) 10 MG tablet Take 1 tablet (10 mg total) by mouth daily. 100 tablet 3  . nortriptyline (PAMELOR) 75 MG capsule TAKE ONE CAPSULE BY MOUTH AT BEDTIME 90 capsule 3  . simvastatin (ZOCOR) 40 MG tablet Take 1 tablet (40 mg total) by mouth at bedtime. 90 tablet 3  . zoster vaccine live, PF, (ZOSTAVAX) 32951 UNT/0.65ML injection Inject 19,400 Units into the skin once. 1 each 0  . lubiprostone (AMITIZA) 24 MCG capsule Take 1 capsule (24 mcg total) by mouth 2 (two) times daily with a meal. (Patient not taking: Reported on 11/27/2014) 60 capsule 11   No facility-administered medications prior to visit.    ROS Review of Systems  Constitutional: Negative for chills, activity change, appetite change, fatigue and unexpected weight change.  HENT: Negative for congestion, mouth sores, rhinorrhea, sinus pressure, sneezing, sore throat, trouble swallowing and voice change.   Eyes: Negative for visual disturbance.  Respiratory: Positive for cough and chest tightness. Negative for wheezing.   Cardiovascular: Negative for chest pain and leg swelling.  Gastrointestinal: Negative for nausea and abdominal pain.  Genitourinary: Negative for frequency, difficulty urinating and vaginal pain.  Musculoskeletal: Negative for back pain and gait problem.  Skin: Negative for pallor and rash.  Neurological: Negative for dizziness, tremors, weakness, numbness and headaches.    Psychiatric/Behavioral: Negative for confusion and sleep disturbance.    Objective:  BP 108/70 mmHg  Pulse 81  Temp(Src) 98.6 F (37 C) (Oral)  Wt   SpO2 96%  BP Readings from Last 3 Encounters:  11/27/14 108/70  10/15/14 110/80  10/03/14 126/84    Wt Readings from Last 3 Encounters:  06/29/14 146 lb 9.7 oz (66.5 kg)  12/20/13 140 lb (63.504 kg)  07/10/12 140 lb (63.504 kg)    Physical Exam  Constitutional: She appears well-developed. No distress.  HENT:  Head: Normocephalic.  Right Ear: External ear normal.  Left Ear: External ear normal.  Nose: Nose normal.  Mouth/Throat: Oropharynx is clear and moist.  Eyes: Conjunctivae are normal. Pupils are equal, round, and reactive to light. Right eye exhibits no discharge. Left eye exhibits no discharge.  Neck: Normal range of motion. Neck supple. No JVD present. No tracheal deviation present. No thyromegaly present.  Cardiovascular: Normal rate, regular rhythm and normal heart sounds.   Pulmonary/Chest: No stridor. No respiratory distress. She has no wheezes.  Abdominal: Soft. Bowel sounds are normal. She exhibits no distension and no mass. There is no tenderness. There is no rebound and no guarding.  Musculoskeletal: She exhibits no edema or tenderness.  Lymphadenopathy:    She has no cervical adenopathy.  Neurological: She displays normal reflexes. No cranial nerve deficit. She exhibits normal muscle tone. Coordination normal.  Skin: No rash noted. No erythema.  Psychiatric: She has a normal mood and affect. Her behavior is normal. Judgment and thought content normal.   advair  given   Lab Results  Component Value Date   WBC 4.7 10/03/2014   HGB 13.2 10/03/2014   HCT 39.2 10/03/2014   PLT 331.0 10/03/2014   GLUCOSE 108* 10/03/2014   CHOL 144 06/30/2014   TRIG 143 06/30/2014   HDL 57 06/30/2014   LDLDIRECT 135.5 01/24/2013   LDLCALC 58 06/30/2014   ALT 30 06/29/2014   AST 31 06/29/2014   NA 138 10/03/2014   K  4.4 10/03/2014   CL 101 10/03/2014   CREATININE 0.81 10/03/2014   BUN 18 10/03/2014   CO2 29 10/03/2014   TSH 2.014 06/29/2014   INR 0.96 06/29/2014   HGBA1C 6.2* 06/29/2014    Dg Hand Complete Left  10/03/2014   CLINICAL DATA:  [place] 2 months ago with pain in the third and fourth fingers with swelling  EXAM: LEFT HAND - COMPLETE 3+ VIEW  COMPARISON:  Left hand films of 10/06/2004  FINDINGS: There is soft tissue swelling at the level of the left third and fourth PIP joints. Joint spaces appear well preserved. On the oblique view a small avulsion fragment from the base of the middle phalanx of the left third digit cannot be excluded. No erosion is seen.  IMPRESSION: Cannot exclude a small avulsion fracture fragment from the base of the middle phalanx of the left third digit.   Electronically Signed   By: Ivar Drape M.D.   On: 10/03/2014 10:09    Assessment & Plan:   There are no diagnoses linked to this encounter. I have discontinued Ms. Monda's zoster vaccine live (PF). I am also having her maintain her citalopram, aspirin EC, lubiprostone, nortriptyline, Vitamin D3, ezetimibe, simvastatin, and pantoprazole.  Meds ordered this encounter  Medications  . pantoprazole (PROTONIX) 40 MG tablet    Sig: Take 1 tablet by mouth daily as needed.     Follow-up: No Follow-up on file.  Walker Kehr, MD

## 2014-11-27 NOTE — Progress Notes (Signed)
Pre visit review using our clinic review tool, if applicable. No additional management support is needed unless otherwise documented below in the visit note. 

## 2014-12-17 ENCOUNTER — Ambulatory Visit: Payer: Medicare Other | Admitting: Family Medicine

## 2014-12-31 ENCOUNTER — Telehealth: Payer: Self-pay | Admitting: *Deleted

## 2014-12-31 DIAGNOSIS — R059 Cough, unspecified: Secondary | ICD-10-CM

## 2014-12-31 DIAGNOSIS — R05 Cough: Secondary | ICD-10-CM

## 2014-12-31 NOTE — Telephone Encounter (Signed)
Is it better? Thx

## 2014-12-31 NOTE — Telephone Encounter (Signed)
Pt left msg on triage stating saw md couple weeks ago for cough. She is still coughing & wheezing. Completed sample of advair that was given. Requesting md recommendation...[name]

## 2015-01-01 NOTE — Telephone Encounter (Signed)
Called pt she states its a little better she still wheezing, coughing up phelgm, but it has no color. Denied fever or SOB sxs.....Evelyn Murphy

## 2015-01-02 NOTE — Telephone Encounter (Signed)
I'll ref [name] to see a Pulmonary specialist. Thx

## 2015-01-16 ENCOUNTER — Encounter: Payer: Self-pay | Admitting: Pulmonary Disease

## 2015-01-16 ENCOUNTER — Ambulatory Visit (INDEPENDENT_AMBULATORY_CARE_PROVIDER_SITE_OTHER)
Admission: RE | Admit: 2015-01-16 | Discharge: 2015-01-16 | Disposition: A | Discharge status: Home / Self Care | Payer: Medicare Other | Source: Ambulatory Visit | Attending: Pulmonary Disease | Admitting: Pulmonary Disease

## 2015-01-16 ENCOUNTER — Ambulatory Visit (INDEPENDENT_AMBULATORY_CARE_PROVIDER_SITE_OTHER): Payer: Medicare Other | Admitting: Pulmonary Disease

## 2015-01-16 VITALS — BP 118/80 | HR 72 | Temp 97.8°F | Ht 64.0 in | Wt 145.0 lb

## 2015-01-16 DIAGNOSIS — R05 Cough: Secondary | ICD-10-CM

## 2015-01-16 DIAGNOSIS — R053 Chronic cough: Secondary | ICD-10-CM

## 2015-01-16 MED ORDER — FLUTICASONE FUROATE-VILANTEROL 100-25 MCG/INH IN AEPB: 1.0000 | INHALATION_SPRAY | Freq: Every day | RESPIRATORY_TRACT | Status: DC

## 2015-01-16 MED ORDER — FLUTICASONE PROPIONATE 50 MCG/ACT NA SUSP: 2.0000 | Freq: Every day | NASAL | Status: DC

## 2015-01-16 NOTE — Patient Instructions (Signed)
Chest xray today Will schedule pulmonary function test Breo one puff daily >> rinse mouth after each use Nasal irrigation (saline sinus rinse) daily  Flonase two sprays each nostril daily  Follow up in 4 weeks with Dr. Halford Chessman or [place]

## 2015-01-16 NOTE — Progress Notes (Signed)
Chief Complaint  Patient presents with  . PULMONARY CONSULT    Pt referred by Dr. Alain Marion for  chronic cough: pt states shes had this cough for about 6 weeks. pt states the cough is dry and prod at times clear in color. no c/o SOB or chest tighness. pt does c/o some wheezing.    History of Present Illness: Evelyn Murphy is a 69 y.o. female for evaluation of cough.  This current episode started about 6 weeks ago.  She gets spells like this frequently, but this spell has lasted longer than usual.    She feels congested in her chest.  She has wheeze in her chest and upper throat.  She occasionally brings up sputum that is clear.  She denies hemoptysis.  She gets a globus sensation. She is not sure about having post nasal drip.  She denies reflux.  Her cough gets worse when she lays flat, and will wake her up from sleep.   There is no history of allergies or asthma.  She had walking pneumonia years ago.  There is no history of exposure to tuberculosis.  She never smoked cigarettes.  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  She does a lot of sewing with fabric, but not much with yarn.  She is from [place], but has lived in [place] for the past 25 years.  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  She denies sick exposures.  She has not had recent PFT or Chest xray.  She has history of multinodular thyroid.  She had MRI brain from 06/29/14 >> noted to have mucosal thickening in paranasal sinuses.  She was given advair sample in September, but has not been using since.  This helped some.  TESTS: Echo 06/30/14 >> EF 55 to 75%, grade 1 diastolic dysfx  Evelyn Murphy  has a past medical history of Hyperlipidemia; Anxiety; Insomnia; and RBBB.  Evelyn Murphy  has past surgical history that includes Shoulder arthroscopy (10/2011) and Colonoscopy.  Prior to Admission medications   Medication Sig Start Date End Date Taking? Authorizing Provider  aspirin EC 81 MG  tablet Take 1 tablet (81 mg total) by mouth daily. 07/01/14  Yes Bonnielee Haff, MD  citalopram (CELEXA) 40 MG tablet Take 1 tablet (40 mg total) by mouth daily. 01/31/14  Yes Aleksei Plotnikov V, MD  ezetimibe (ZETIA) 10 MG tablet Take 1 tablet (10 mg total) by mouth daily. 10/03/14  Yes Aleksei Plotnikov V, MD  nortriptyline (PAMELOR) 75 MG capsule TAKE ONE CAPSULE BY MOUTH AT BEDTIME 08/05/14  Yes Aleksei Plotnikov V, MD  pantoprazole (PROTONIX) 40 MG tablet Take 1 tablet by mouth daily as needed. 11/11/14  Yes Historical Provider, MD  simvastatin (ZOCOR) 40 MG tablet Take 1 tablet (40 mg total) by mouth at bedtime. 10/03/14  Yes Aleksei Plotnikov V, MD  Cholecalciferol (VITAMIN D3) 2000 UNITS capsule Take 1 capsule (2,000 Units total) by mouth daily. 10/03/14   Aleksei Plotnikov V, MD  Fluticasone-Salmeterol (ADVAIR DISKUS) 100-50 MCG/DOSE AEPB Inhale 1 puff into the lungs 2 (two) times daily. Patient not taking: Reported on 01/16/2015 11/27/14   Cassandria Anger, MD  lubiprostone (AMITIZA) 24 MCG capsule Take 1 capsule (24 mcg total) by mouth 2 (two) times daily with a meal. Patient not taking: Reported on 11/27/2014 07/04/14   Cassandria Anger, MD  promethazine-codeine (PHENERGAN WITH CODEINE) 6.25-10 MG/5ML syrup Take 5 mLs by mouth every 4 (four) hours as  needed. Patient not taking: Reported on 01/16/2015 11/27/14   Cassandria Anger, MD    Allergies  Allergen Reactions  . Linzess [Linaclotide]     Too strong  . Lipitor [Atorvastatin]     elev LFTs    Her family history includes COPD in her father; Diabetes in her brother and mother; Heart disease in her brother and father; Heart disease (age of onset: 5) in her mother.  She  reports that she has never smoked. She has never used smokeless tobacco. She reports that she does not drink alcohol or use illicit drugs.  Review of Systems  Constitutional: Negative for fever and unexpected weight change.  HENT: Positive for postnasal drip.  Negative for congestion, dental problem, ear pain, nosebleeds, rhinorrhea, sinus pressure, sneezing, sore throat and trouble swallowing.   Eyes: Negative for redness and itching.  Respiratory: Positive for cough and wheezing. Negative for chest tightness and shortness of breath.   Cardiovascular: Negative for palpitations and leg swelling.  Gastrointestinal: Negative for nausea and vomiting.  Genitourinary: Negative for dysuria.  Musculoskeletal: Negative for joint swelling.  Skin: Negative for rash.  Neurological: Negative for headaches.  Hematological: Bruises/bleeds easily.  Psychiatric/Behavioral: Negative for dysphoric mood. The patient is not nervous/anxious.    Physical Exam: BP 118/80 mmHg  Pulse 72  Temp(Src) 97.8 F (36.6 C) (Oral)  Ht 5\' 4"  (1.626 m)  Wt 145 lb (65.772 kg)  BMI 24.88 kg/m2  SpO2 98%  General - No distress ENT - No sinus tenderness, clear nasal drainage, no oral exudate, no LAN, no thyromegaly, TM clear, pupils equal/reactive, b/l cerumen build up Cardiac - s1s2 regular, no murmur, pulses symmetric Chest - No wheeze/rales/dullness, good air entry, normal respiratory excursion Back - No focal tenderness Abd - Soft, non-tender, no organomegaly, + bowel sounds Ext - No edema Neuro - Normal strength, cranial nerves intact Skin - No rashes Psych - Normal mood, and behavior   Lab Results  Component Value Date   WBC 4.7 10/03/2014   HGB 13.2 10/03/2014   HCT 39.2 10/03/2014   MCV 87.1 10/03/2014   PLT 331.0 10/03/2014    Lab Results  Component Value Date   CREATININE 0.81 10/03/2014   BUN 18 10/03/2014   NA 138 10/03/2014   K 4.4 10/03/2014   CL 101 10/03/2014   CO2 29 10/03/2014    Lab Results  Component Value Date   ALT 30 06/29/2014   AST 31 06/29/2014   ALKPHOS 111 06/29/2014   BILITOT 0.3 06/29/2014    Lab Results  Component Value Date   TSH 2.014 06/29/2014    Discussion: She has persistent cough for the past several weeks.   She is a non-smoker.  She reports frequent, sporadic episodes of cough.  I am mostly concerned about upper airway cough from rhinitis with post-nasal drip, and possible asthma.  Assessment/Plan:  Chronic cough. Plan: - will arrange for chest xray and PFTs - will have her use nasal irrigation and flonase - will have her use Breo - if no improvement, then will need assessment for reflux  Cerumen build up. Plan: - she is to f/u with her PCP    Chesley Mires, MD [facility] Pager:  [phone]

## 2015-01-16 NOTE — Progress Notes (Deleted)
   Subjective:    Patient ID: Evelyn Murphy, female    DOB: 06-20-1945, 69 y.o.   MRN: [id number]  HPI    Review of Systems  Constitutional: Negative for fever and unexpected weight change.  HENT: Positive for postnasal drip. Negative for congestion, dental problem, ear pain, nosebleeds, rhinorrhea, sinus pressure, sneezing, sore throat and trouble swallowing.   Eyes: Negative for redness and itching.  Respiratory: Positive for cough and wheezing. Negative for chest tightness and shortness of breath.   Cardiovascular: Negative for palpitations and leg swelling.  Gastrointestinal: Negative for nausea and vomiting.  Genitourinary: Negative for dysuria.  Musculoskeletal: Negative for joint swelling.  Skin: Negative for rash.  Neurological: Negative for headaches.  Hematological: Bruises/bleeds easily.  Psychiatric/Behavioral: Negative for dysphoric mood. The patient is not nervous/anxious.        Objective:   Physical Exam        Assessment & Plan:

## 2015-01-17 ENCOUNTER — Institutional Professional Consult (permissible substitution): Payer: Medicare Other | Admitting: Pulmonary Disease

## 2015-01-21 ENCOUNTER — Telehealth: Payer: Self-pay | Admitting: Pulmonary Disease

## 2015-01-21 NOTE — Telephone Encounter (Signed)
Dg Chest 2 View  01/16/2015  CLINICAL DATA:  Dry cough for 6 weeks EXAM: CHEST  2 VIEW COMPARISON:  06/29/2014 FINDINGS: The heart size and mediastinal contours are within normal limits. Both lungs are clear. The visualized skeletal structures are unremarkable. IMPRESSION: No active cardiopulmonary disease. Electronically Signed   By: Kerby Moors M.D.   On: 01/16/2015 15:30    Will have my nurse inform pt that CXR was normal.

## 2015-01-22 DIAGNOSIS — M25511 Pain in right shoulder: Secondary | ICD-10-CM | POA: Diagnosis not present

## 2015-01-22 DIAGNOSIS — M898X1 Other specified disorders of bone, shoulder: Secondary | ICD-10-CM | POA: Diagnosis not present

## 2015-01-22 NOTE — Telephone Encounter (Signed)
Spoke with pt and notified of results per Dr. Sood. Pt verbalized understanding and denied any questions.  

## 2015-01-27 DIAGNOSIS — M47812 Spondylosis without myelopathy or radiculopathy, cervical region: Secondary | ICD-10-CM | POA: Diagnosis not present

## 2015-01-27 DIAGNOSIS — M5030 Other cervical disc degeneration, unspecified cervical region: Secondary | ICD-10-CM | POA: Diagnosis not present

## 2015-02-06 DIAGNOSIS — Z23 Encounter for immunization: Secondary | ICD-10-CM | POA: Diagnosis not present

## 2015-02-09 ENCOUNTER — Other Ambulatory Visit: Payer: Self-pay | Admitting: Internal Medicine

## 2015-02-11 ENCOUNTER — Encounter: Payer: Self-pay | Admitting: Rehabilitation

## 2015-02-11 ENCOUNTER — Ambulatory Visit: Payer: Medicare Other | Attending: Orthopaedic Surgery | Admitting: Rehabilitation

## 2015-02-11 DIAGNOSIS — M542 Cervicalgia: Secondary | ICD-10-CM | POA: Diagnosis not present

## 2015-02-11 NOTE — Therapy (Signed)
[facility] [address] [place], [place], [address] Phone: [phone]   Fax:  [phone]  Physical Therapy Evaluation  Patient Details  Name: Evelyn Murphy MRN: [id number] Date of Birth: 1945-05-29 Referring Provider: Joni Fears, MD  Encounter Date: 02/11/2015      PT End of Session - 02/11/15 1731    Visit Number 1   Date for PT Re-Evaluation 03/25/15   PT Start Time 1618   PT Stop Time 1702   PT Time Calculation (min) 44 min   Activity Tolerance Patient tolerated treatment well      Past Medical History  Diagnosis Date  . Hyperlipidemia   . Anxiety   . Insomnia   . RBBB     Past Surgical History  Procedure Laterality Date  . Shoulder arthroscopy  10/2011    R Dr Meyer Cory  . Colonoscopy      There were no vitals filed for this visit.  Visit Diagnosis:  Neck pain - Plan: PT plan of care cert/re-cert      Subjective Assessment - 02/11/15 1618    Subjective presents with cervical radiculopathy into the R shoulder.  The last few days has been awful.  Started 2-3 months ago, getting worse since onset.  No numbness and tingling.  Getting difficult to bend down and pick things up and sleeping.     Pertinent History no history of this    Limitations Lifting  only from the floor   Diagnostic tests OA of the neck per xray.  no MRI   Currently in Pain? No/denies  lasts only a few seconds   Pain Score 7   usually not present at rest.  only when bending to get things from the floor or at night   Pain Location Shoulder   Pain Orientation Right   Pain Descriptors / Indicators Aching;Burning   Pain Type Acute pain   Pain Onset More than a month ago   Pain Frequency Intermittent   Aggravating Factors  bending down to pick things up   Pain Relieving Factors rest, not bending down             [facility] PT Assessment - 02/11/15 0001    Assessment   Medical Diagnosis neck pain   Referring Provider  peter whitfield, MD   Onset Date/Surgical Date 12/12/14   Hand Dominance Left   Next MD Visit not scheduled   Prior Therapy no   Precautions   Precautions None   Restrictions   Weight Bearing Restrictions No   Balance Screen   Has the patient fallen in the past 6 months No   Has the patient had a decrease in activity level because of a fear of falling?  No   Is the patient reluctant to leave their home because of a fear of falling?  No   Home Ec[employer] residence   Prior Function   Level of Independence Independent   Leisure sewing, quilting   Observation/Other Assessments   Focus on Therapeutic Outcomes (FOTO)  23% limited   Sensation   Light Touch Appears Intact   Posture/Postural Control   Posture/Postural Control Postural limitations   Postural Limitations Rounded Shoulders;Forward head   Posture Comments pain into neck rotation to the R decreased with postural corrections   ROM / Strength   AROM / PROM / Strength AROM;PROM;Strength   AROM   AROM Assessment Site Cervical   Cervical Flexion 30  Cervical Extension 40   Cervical - Right Side Bend 25   Cervical - Left Side Bend 25   Cervical - Right Rotation 55  pain   Cervical - Left Rotation 50  pn   PROM   PROM Assessment Site Cervical   Cervical Flexion 30   Cervical - Right Side Bend 30   Cervical - Left Side Bend 25   Cervical - Right Rotation 55   Cervical - Left Rotation 55   Strength   Overall Strength Comments all UE myotomes strong and painfree   Palpation   Spinal mobility pain and PA restrictions at T4-6 and C5-T1   Palpation comment +2 ttp R UT, LS, cervical paraspinals   Special Tests    Special Tests Cervical   Cervical Tests Spurling's;Dictraction   Spurling's   Findings Negative   Side Right   Distraction Test   Findngs Negative   side Right                   [facility] Adult PT Treatment/Exercise - 02/11/15 0001    Modalities   Modalities Moist  Heat;Electrical Stimulation   Moist Heat Therapy   Number Minutes Moist Heat 10 Minutes   Moist Heat Location Cervical   Electrical Stimulation   Electrical Stimulation Location R UT   Electrical Stimulation Parameters PreMod   Electrical Stimulation Goals Pain                PT Education - 02/11/15 1730    Education provided Yes   Education Details HEP, diagnosis, POC   Person(s) Educated Patient   Methods Explanation;Demonstration;Verbal cues;Tactile cues;Handout   Comprehension Verbalized understanding;Returned demonstration          PT Short Term Goals - 02/11/15 1735    PT SHORT TERM GOAL #1   Title pt will be independent with initial HEP   Time 2   Period Weeks   Status New           PT Long Term Goals - 02/11/15 1735    PT LONG TERM GOAL #1   Title pt will be independent with final HEP   Time 6   Period Weeks   Status New   PT LONG TERM GOAL #2   Title pt will report no R sided UT pain during daily activities   Time 6   Period Weeks   Status New   PT LONG TERM GOAL #3   Title pt will have painfree cervical AROM   Time 6   Period Weeks   Status New               Plan - 02/11/15 1731    Clinical Impression Statement Pt presents with R radicular pain into the R Upper trapezius region with pain reproduced during the day only when bending down to pick things up and when lying down in bed.  Pain was reproduced in exam by cervical rotation to the R which was decreased by postural correction.  Tenderness present in the R Uppertrap, cervical paraspinals , and levator scapulae and with CPA of the lower cspine and upper tspine regions.     Pt will benefit from skilled therapeutic intervention in order to improve on the following deficits Pain;Hypomobility;Decreased mobility;Decreased range of motion   Rehab Potential Excellent   PT Frequency 2x / week   PT Duration 6 weeks   PT Treatment/Interventions Electrical Stimulation;Moist  Heat;Traction;Therapeutic exercise;Neuromuscular re-education;Manual techniques   PT Next Visit Plan STM R  UT region, joint mob, postural TE/neck stretches, modalities, traction?   Consulted and Agree with Plan of Care Patient          G-Codes - Feb 22, 2015 1737    Functional Assessment Tool Used FOTO 23% limited   Functional Limitation Carrying, moving and handling objects   Carrying, Moving and Handling Objects Current Status [phone]) At least 20 percent but less than 40 percent impaired, limited or restricted   Carrying, Moving and Handling Objects Goal Status [id number]) At least 1 percent but less than 20 percent impaired, limited or restricted       Problem List Patient Active Problem List   Diagnosis Date Noted  . Cough 11/27/2014  . Phalanx, proximal fracture of finger 10/15/2014  . Anemia 10/03/2014  . Finger sprain 08/12/2014  . Quadriceps tendinitis 07/22/2014  . Constipation 07/04/2014  . Altered mental status   . Slurred speech 06/29/2014  . Unsteady gait 06/29/2014  . Confusion 06/29/2014  . TIA (transient ischemic attack) 06/29/2014  . Urinary frequency 01/31/2014  . Diastolic dysfunction [id number]  . Chest pain 11/22/2013  . Left shoulder pain 11/22/2013  . Unspecified constipation 07/31/2013  . GERD (gastroesophageal reflux disease) 01/24/2013  . Asthma 07/22/2012  . Nonspecific abnormal electrocardiogram (ECG) (EKG) 07/10/2012  . Well adult exam 07/10/2012  . Abnormal CBC 01/11/2012  . Cerumen impaction 07/06/2011  . Shoulder pain, right 07/06/2011  . DYSPHAGIA UNSPECIFIED 03/04/2010  . PELVIC PAIN, LEFT 07/18/2008  . RENAL CYST 05/14/2008  . ABNORMAL LIVER FUNCTION TESTS 04/30/2008  . Hyperlipidemia 02/06/2007  . INSOMNIA, PERSISTENT 02/06/2007  . Depression with anxiety 12/02/2006    Stark Bray, DPT, CMP February 22, 2015, 5:39 PM  [facility] [address] [place], [place],  [address] Phone: [phone]   Fax:  [phone]  Name: Evelyn Murphy MRN: [id number] Date of Birth: Jul 31, 1945

## 2015-02-17 ENCOUNTER — Encounter: Payer: Self-pay | Admitting: Cardiovascular Disease

## 2015-02-18 MED ORDER — ROSUVASTATIN CALCIUM 10 MG PO TABS: 10.0000 mg | ORAL_TABLET | Freq: Every day | ORAL | Status: DC

## 2015-02-18 NOTE — Telephone Encounter (Signed)
I think these are good changes.    DC simvastatin    Start Rosuvastatin 10 mg a day     Check fasting lipids, liver, BMP in 3 months     Per Dr. Acie Fredrickson, Crestor Rx sent and Simvastatin has been d/c'ed.  I have scheduled patient for follow-up lab work in 3 months and sent message to patient through [facility].

## 2015-02-19 ENCOUNTER — Other Ambulatory Visit: Payer: Self-pay | Admitting: Surgery

## 2015-02-19 ENCOUNTER — Ambulatory Visit (INDEPENDENT_AMBULATORY_CARE_PROVIDER_SITE_OTHER): Payer: Medicare Other | Admitting: Adult Health

## 2015-02-19 ENCOUNTER — Encounter: Payer: Self-pay | Admitting: Adult Health

## 2015-02-19 ENCOUNTER — Ambulatory Visit (INDEPENDENT_AMBULATORY_CARE_PROVIDER_SITE_OTHER): Payer: Medicare Other | Admitting: Pulmonary Disease

## 2015-02-19 VITALS — BP 108/70 | HR 75 | Temp 97.8°F | Ht 63.0 in | Wt 151.0 lb

## 2015-02-19 DIAGNOSIS — R059 Cough, unspecified: Secondary | ICD-10-CM

## 2015-02-19 DIAGNOSIS — R05 Cough: Secondary | ICD-10-CM | POA: Diagnosis not present

## 2015-02-19 DIAGNOSIS — R053 Chronic cough: Secondary | ICD-10-CM

## 2015-02-19 LAB — PULMONARY FUNCTION TEST
DL/VA % PRED: 101 %
DL/VA: 4.82 ml/min/mmHg/L
DLCO unc % pred: 72 %
DLCO unc: 16.99 ml/min/mmHg
FEF 25-75 POST: 2.41 L/s
FEF 25-75 Pre: 2 L/sec
FEF2575-%Change-Post: 20 %
FEF2575-%PRED-POST: 121 %
FEF2575-%Pred-Pre: 100 %
FEV1-%CHANGE-POST: 3 %
FEV1-%Pred-Post: 81 %
FEV1-%Pred-Pre: 78 %
FEV1-Post: 1.88 L
FEV1-Pre: 1.81 L
FEV1FVC-%CHANGE-POST: 4 %
FEV1FVC-%PRED-PRE: 107 %
FEV6-%Change-Post: -3 %
FEV6-%PRED-PRE: 76 %
FEV6-%Pred-Post: 73 %
FEV6-POST: 2.13 L
FEV6-Pre: 2.21 L
FEV6FVC-%PRED-POST: 104 %
FEV6FVC-%Pred-Pre: 104 %
FVC-%Change-Post: 0 %
FVC-%PRED-POST: 72 %
FVC-%PRED-PRE: 73 %
FVC-POST: 2.19 L
FVC-PRE: 2.21 L
POST FEV6/FVC RATIO: 100 %
PRE FEV1/FVC RATIO: 82 %
PRE FEV6/FVC RATIO: 100 %
Post FEV1/FVC ratio: 86 %
RV % PRED: 98 %
RV: 2.09 L
TLC % pred: 86 %
TLC: 4.29 L

## 2015-02-19 NOTE — Progress Notes (Signed)
Subjective:    Patient ID: Evelyn Murphy, female    DOB: 01-01-1946, 69 y.o.   MRN: [id number]  HPI 69yo female seen for pulmonary consult for chronic cough 01/16/15 with Dr. Halford Chessman  .   TESTS: Echo 06/30/14 >> EF 55 to [id number], grade 1 diastolic dysfx   [id number] Follow up : Cough  Pt returns for a one-month follow-up. Patient was seen for pulmonary consult. Last visit for chronic cough. Patient was placed on BREO . Patient says that her cough has subsided.. Patient had PFTs done this morning (has not taken BREO before PFT) .  Postbronchodilator FEV1 81%, ratio 86, FVC 72%, no significant bronchodilator response. DLCO 72%. Mid flows were normal, but did have some bronchodilator response. Chest x-ray in October was clear Says she does not use BREO all the time but feels it helped her cough . Very glad cough is gone.    She denies any chest pain, orthopnea, PND, leg swelling, hemoptysis or fever.  Past Medical History  Diagnosis Date  . Hyperlipidemia   . Anxiety   . Insomnia   . RBBB    Current Outpatient Prescriptions on File Prior to Visit  Medication Sig Dispense Refill  . aspirin EC 81 MG tablet Take 1 tablet (81 mg total) by mouth daily. 30 tablet 0  . Cholecalciferol (VITAMIN D3) 2000 UNITS capsule Take 1 capsule (2,000 Units total) by mouth daily. 100 capsule 3  . citalopram (CELEXA) 40 MG tablet Take 1 tablet (40 mg total) by mouth daily. 90 tablet 3  . Fluticasone Furoate-Vilanterol (BREO ELLIPTA) 100-25 MCG/INH AEPB Inhale 1 puff into the lungs daily. 1 each 5  . nortriptyline (PAMELOR) 75 MG capsule TAKE ONE CAPSULE BY MOUTH AT BEDTIME 90 capsule 3  . pantoprazole (PROTONIX) 40 MG tablet TAKE ONE TABLET BY MOUTH ONE TIME DAILY 90 tablet 3  . rosuvastatin (CRESTOR) 10 MG tablet Take 1 tablet (10 mg total) by mouth daily. 90 tablet 3  . ezetimibe (ZETIA) 10 MG tablet Take 1 tablet (10 mg total) by mouth daily. (Patient not taking: Reported on 02/19/2015) 100 tablet 3  .  fluticasone (FLONASE) 50 MCG/ACT nasal spray Place 2 sprays into both nostrils daily. (Patient not taking: Reported on 02/19/2015) 16 g 2  . lubiprostone (AMITIZA) 24 MCG capsule Take 1 capsule (24 mcg total) by mouth 2 (two) times daily with a meal. (Patient not taking: Reported on 02/19/2015) 60 capsule 11  . promethazine-codeine (PHENERGAN WITH CODEINE) 6.25-10 MG/5ML syrup Take 5 mLs by mouth every 4 (four) hours as needed. (Patient not taking: Reported on 01/16/2015) 300 mL 0   No current facility-administered medications on file prior to visit.       Review of Systems Constitutional:   No  weight loss, night sweats,  Fevers, chills, fatigue, or  lassitude.  HEENT:   No headaches,  Difficulty swallowing,  Tooth/dental problems, or  Sore throat,                No sneezing, itching, ear ache, nasal congestion, post nasal drip,   CV:  No chest pain,  Orthopnea, PND, swelling in lower extremities, anasarca, dizziness, palpitations, syncope.   GI  No heartburn, indigestion, abdominal pain, nausea, vomiting, diarrhea, change in bowel habits, loss of appetite, bloody stools.   Resp: No shortness of breath with exertion or at rest.  No excess mucus, no productive cough,  No non-productive cough,  No coughing up of blood.  No change in color of  mucus.  No wheezing.  No chest wall deformity  Skin: no rash or lesions.  GU: no dysuria, change in color of urine, no urgency or frequency.  No flank pain, no hematuria   MS:  No joint pain or swelling.  No decreased range of motion.  No back pain.  Psych:  No change in mood or affect. No depression or anxiety.  No memory loss.         Objective:   Physical Exam GEN: A/Ox3; pleasant , NAD   HEENT:  [place]/AT,  EACs-clear, TMs-wnl, NOSE-clear, THROAT-clear, no lesions, no postnasal drip or exudate noted.   NECK:  Supple w/ fair ROM; no JVD; normal carotid impulses w/o bruits; no thyromegaly or nodules palpated; no lymphadenopathy.  RESP   Clear  P & A; w/o, wheezes/ rales/ or rhonchi.no accessory muscle use, no dullness to percussion  CARD:  RRR, no m/r/g  , no peripheral edema, pulses intact, no cyanosis or clubbing.  GI:   Soft & nt; nml bowel sounds; no organomegaly or masses detected.  Musco: Warm bil, no deformities or joint swelling noted.   Neuro: alert, no focal deficits noted.    Skin: Warm, no lesions or rashes         Assessment & Plan:

## 2015-02-19 NOTE — Patient Instructions (Signed)
Continue on current regimen .  follow up Dr. Halford Chessman  In 6 months and As needed

## 2015-02-19 NOTE — Assessment & Plan Note (Signed)
Cough is resolved . ? RAD improved on BREO  PFT are essentially normal with no airflow obstruction. Patient did have very mild restriction and decreased DLCO .  CXR was clear in OCT .  Continue on current regimen  follow up Dr. Halford Chessman  In 6 months and As needed

## 2015-02-19 NOTE — Progress Notes (Signed)
Reviewed and agree with assessment/plan. 

## 2015-02-19 NOTE — Progress Notes (Signed)
PFT done today. 

## 2015-02-24 ENCOUNTER — Ambulatory Visit (INDEPENDENT_AMBULATORY_CARE_PROVIDER_SITE_OTHER): Payer: Medicare Other | Admitting: Internal Medicine

## 2015-02-24 ENCOUNTER — Encounter: Payer: Self-pay | Admitting: Internal Medicine

## 2015-02-24 VITALS — BP 120/84 | HR 77 | Ht 63.5 in | Wt 145.0 lb

## 2015-02-24 DIAGNOSIS — K5901 Slow transit constipation: Secondary | ICD-10-CM | POA: Diagnosis not present

## 2015-02-24 DIAGNOSIS — E785 Hyperlipidemia, unspecified: Secondary | ICD-10-CM | POA: Insufficient documentation

## 2015-02-24 DIAGNOSIS — Z0000 Encounter for general adult medical examination without abnormal findings: Secondary | ICD-10-CM

## 2015-02-24 DIAGNOSIS — F418 Other specified anxiety disorders: Secondary | ICD-10-CM | POA: Diagnosis not present

## 2015-02-24 DIAGNOSIS — D6489 Other specified anemias: Secondary | ICD-10-CM

## 2015-02-24 MED ORDER — LACTULOSE 10 GM/15ML PO SOLN: 20.0000 g | Freq: Three times a day (TID) | ORAL | Status: DC

## 2015-02-24 MED ORDER — ZOLPIDEM TARTRATE 10 MG PO TABS: 5.0000 mg | ORAL_TABLET | Freq: Every evening | ORAL | Status: DC | PRN

## 2015-02-24 NOTE — Assessment & Plan Note (Signed)
CBC

## 2015-02-24 NOTE — Assessment & Plan Note (Signed)
Chronic Crestor

## 2015-02-24 NOTE — Assessment & Plan Note (Signed)
Here for medicare wellness/physical  Diet: heart healthy  Physical activity: not sedentary  Depression/mood screen: negative  Hearing: intact to whispered voice  Visual acuity: grossly normal, performs annual eye exam  ADLs: capable  Fall risk: none  Home safety: good  Cognitive evaluation: intact to orientation, naming, recall and repetition  EOL planning: adv directives, full code/ I agree  I have personally reviewed and have noted  1. The patient's medical, surgical and social history  2. Their use of alcohol, tobacco or illicit drugs  3. Their current medications and supplements  4. The patient's functional ability including ADL's, fall risks, home safety risks and hearing or visual impairment.  5. Diet and physical activities  6. Evidence for depression or mood disorders 7. The roster of all physicians providing medical care to patient - is listed in the Snapshot section of the chart and reviewed today.    Today patient counseled on age appropriate routine health concerns for screening and prevention, each reviewed and up to date or declined. Immunizations reviewed and up to date or declined. Labs ordered and reviewed. Risk factors for depression reviewed and negative. Hearing function and visual acuity are intact. ADLs screened and addressed as needed. Functional ability and level of safety reviewed and appropriate. Education, counseling and referrals performed based on assessed risks today. Patient provided with a copy of personalized plan for preventive services.    Zostavax

## 2015-02-24 NOTE — Progress Notes (Signed)
Subjective:  Patient ID: Evelyn Murphy, female    DOB: 1945/04/01  Age: 69 y.o. MRN: [id number]  CC: No chief complaint on file.   HPI [name] presents for a well exam. F/u insomnia - [name] is using Zolpidem w/caution C/o R shoulder pain - seeing Dr Durward Fortes. C/o constipation  Outpatient Prescriptions Prior to Visit  Medication Sig Dispense Refill  . aspirin EC 81 MG tablet Take 1 tablet (81 mg total) by mouth daily. 30 tablet 0  . Cholecalciferol (VITAMIN D3) 2000 UNITS capsule Take 1 capsule (2,000 Units total) by mouth daily. 100 capsule 3  . citalopram (CELEXA) 40 MG tablet Take 1 tablet (40 mg total) by mouth daily. 90 tablet 3  . Fluticasone Furoate-Vilanterol (BREO ELLIPTA) 100-25 MCG/INH AEPB Inhale 1 puff into the lungs daily. 1 each 5  . nortriptyline (PAMELOR) 75 MG capsule TAKE ONE CAPSULE BY MOUTH AT BEDTIME 90 capsule 3  . pantoprazole (PROTONIX) 40 MG tablet TAKE ONE TABLET BY MOUTH ONE TIME DAILY 90 tablet 3  . rosuvastatin (CRESTOR) 10 MG tablet Take 1 tablet (10 mg total) by mouth daily. 90 tablet 3  . ezetimibe (ZETIA) 10 MG tablet Take 1 tablet (10 mg total) by mouth daily. 100 tablet 3  . fluticasone (FLONASE) 50 MCG/ACT nasal spray Place 2 sprays into both nostrils daily. 16 g 2  . lubiprostone (AMITIZA) 24 MCG capsule Take 1 capsule (24 mcg total) by mouth 2 (two) times daily with a meal. 60 capsule 11  . promethazine-codeine (PHENERGAN WITH CODEINE) 6.25-10 MG/5ML syrup Take 5 mLs by mouth every 4 (four) hours as needed. 300 mL 0   No facility-administered medications prior to visit.    ROS Review of Systems  Constitutional: Negative for chills, activity change, appetite change, fatigue and unexpected weight change.  HENT: Negative for congestion, mouth sores, rhinorrhea and sinus pressure.   Eyes: Negative for visual disturbance.  Respiratory: Negative for cough and chest tightness.   Gastrointestinal: Negative for nausea and abdominal pain.    Genitourinary: Negative for frequency, difficulty urinating and vaginal pain.  Musculoskeletal: Positive for arthralgias. Negative for back pain and gait problem.  Skin: Negative for pallor and rash.  Neurological: Negative for dizziness, tremors, weakness, numbness and headaches.  Psychiatric/Behavioral: Negative for suicidal ideas, confusion and sleep disturbance.    Objective:  BP 120/84 mmHg  Pulse 77  Ht 5' 3.5" (1.613 m)  Wt 145 lb (65.772 kg)  BMI 25.28 kg/m2  SpO2 97%  BP Readings from Last 3 Encounters:  02/24/15 120/84  02/19/15 108/70  01/16/15 118/80    Wt Readings from Last 3 Encounters:  02/24/15 145 lb (65.772 kg)  02/19/15 151 lb (68.493 kg)  01/16/15 145 lb (65.772 kg)    Physical Exam  Constitutional: She appears well-developed. No distress.  HENT:  Head: Normocephalic.  Right Ear: External ear normal.  Left Ear: External ear normal.  Nose: Nose normal.  Mouth/Throat: Oropharynx is clear and moist.  Eyes: Conjunctivae are normal. Pupils are equal, round, and reactive to light. Right eye exhibits no discharge. Left eye exhibits no discharge.  Neck: Normal range of motion. Neck supple. No JVD present. No tracheal deviation present. No thyromegaly present.  Cardiovascular: Normal rate, regular rhythm and normal heart sounds.   Pulmonary/Chest: No stridor. No respiratory distress. She has no wheezes.  Abdominal: Soft. Bowel sounds are normal. She exhibits no distension and no mass. There is no tenderness. There is no rebound and no guarding.  Musculoskeletal: She exhibits tenderness. She exhibits no edema.  Lymphadenopathy:    She has no cervical adenopathy.  Neurological: She displays normal reflexes. No cranial nerve deficit. She exhibits normal muscle tone. Coordination normal.  Skin: No rash noted. No erythema.  Psychiatric: She has a normal mood and affect. Her behavior is normal. Judgment and thought content normal.    Lab Results  Component  Value Date   WBC 4.7 10/03/2014   HGB 13.2 10/03/2014   HCT 39.2 10/03/2014   PLT 331.0 10/03/2014   GLUCOSE 108* 10/03/2014   CHOL 144 06/30/2014   TRIG 143 06/30/2014   HDL 57 06/30/2014   LDLDIRECT 135.5 01/24/2013   LDLCALC 58 06/30/2014   ALT 30 06/29/2014   AST 31 06/29/2014   NA 138 10/03/2014   K 4.4 10/03/2014   CL 101 10/03/2014   CREATININE 0.81 10/03/2014   BUN 18 10/03/2014   CO2 29 10/03/2014   TSH 2.014 06/29/2014   INR 0.96 06/29/2014   HGBA1C 6.2* 06/29/2014    Dg Chest 2 View  01/16/2015  CLINICAL DATA:  Dry cough for 6 weeks EXAM: CHEST  2 VIEW COMPARISON:  06/29/2014 FINDINGS: The heart size and mediastinal contours are within normal limits. Both lungs are clear. The visualized skeletal structures are unremarkable. IMPRESSION: No active cardiopulmonary disease. Electronically Signed   By: Kerby Moors M.D.   On: 01/16/2015 15:30    Assessment & Plan:   Diagnoses and all orders for this visit:  Well adult exam -     Basic metabolic panel; Future -     CBC with Differential/Platelet; Future -     Hepatic function panel; Future -     Lipid panel; Future -     TSH; Future -     Urinalysis; Future  Anemia due to other cause -     Basic metabolic panel; Future -     CBC with Differential/Platelet; Future -     Hepatic function panel; Future -     Lipid panel; Future -     TSH; Future -     Urinalysis; Future  Slow transit constipation -     Basic metabolic panel; Future -     CBC with Differential/Platelet; Future -     Hepatic function panel; Future -     Lipid panel; Future -     TSH; Future -     Urinalysis; Future  Dyslipidemia -     Basic metabolic panel; Future -     CBC with Differential/Platelet; Future -     Hepatic function panel; Future -     Lipid panel; Future -     TSH; Future -     Urinalysis; Future  Other orders -     lactulose (CHRONULAC) 10 GM/15ML solution; Take 30-45 mLs (20-30 g total) by mouth 3 (three) times  daily. -     zolpidem (AMBIEN) 10 MG tablet; Take 0.5-1 tablets (5-10 mg total) by mouth at bedtime as needed for sleep.  I have discontinued Ms. Schrupp's lubiprostone, ezetimibe, promethazine-codeine, and fluticasone. I am also having her start on lactulose and zolpidem. Additionally, I am having her maintain her citalopram, aspirin EC, nortriptyline, Vitamin D3, Fluticasone Furoate-Vilanterol, pantoprazole, and rosuvastatin.  Meds ordered this encounter  Medications  . lactulose (CHRONULAC) 10 GM/15ML solution    Sig: Take 30-45 mLs (20-30 g total) by mouth 3 (three) times daily.    Dispense:  473 mL    Refill:  11  .  zolpidem (AMBIEN) 10 MG tablet    Sig: Take 0.5-1 tablets (5-10 mg total) by mouth at bedtime as needed for sleep.    Dispense:  90 tablet    Refill:  1     Follow-up: Return in about 6 months (around 08/25/2015) for a follow-up visit.  Walker Kehr, MD

## 2015-02-24 NOTE — Assessment & Plan Note (Signed)
On Pamelor 

## 2015-02-24 NOTE — Assessment & Plan Note (Signed)
Try Lactulose

## 2015-02-24 NOTE — Patient Instructions (Addendum)
Preventive Care for Adults, Female A healthy lifestyle and preventive care can promote health and wellness. Preventive health guidelines for women include the following key practices.  A routine yearly physical is a good way to check with your health care provider about your health and preventive screening. It is a chance to share any concerns and updates on your health and to receive a thorough exam.  Visit your dentist for a routine exam and preventive care every 6 months. Brush your teeth twice a day and floss once a day. Good oral hygiene prevents tooth decay and gum disease.  The frequency of eye exams is based on your age, health, family medical history, use of contact lenses, and other factors. Follow your health care provider's recommendations for frequency of eye exams.  Eat a healthy diet. Foods like vegetables, fruits, whole grains, low-fat dairy products, and lean protein foods contain the nutrients you need without too many calories. Decrease your intake of foods high in solid fats, added sugars, and salt. Eat the right amount of calories for you.Get information about a proper diet from your health care provider, if necessary.  Regular physical exercise is one of the most important things you can do for your health. Most adults should get at least 150 minutes of moderate-intensity exercise (any activity that increases your heart rate and causes you to sweat) each week. In addition, most adults need muscle-strengthening exercises on 2 or more days a week.  Maintain a healthy weight. The body mass index (BMI) is a screening tool to identify possible weight problems. It provides an estimate of body fat based on height and weight. Your health care provider can find your BMI and can help you achieve or maintain a healthy weight.For adults 20 years and older:  A BMI below 18.5 is considered underweight.  A BMI of 18.5 to 24.9 is normal.  A BMI of 25 to 29.9 is considered overweight.  A  BMI of 30 and above is considered obese.  Maintain normal blood lipids and cholesterol levels by exercising and minimizing your intake of saturated fat. Eat a balanced diet with plenty of fruit and vegetables. Blood tests for lipids and cholesterol should begin at age 45 and be repeated every 5 years. If your lipid or cholesterol levels are high, you are over 50, or you are at high risk for heart disease, you may need your cholesterol levels checked more frequently.Ongoing high lipid and cholesterol levels should be treated with medicines if diet and exercise are not working.  If you smoke, find out from your health care provider how to quit. If you do not use tobacco, do not start.  Lung cancer screening is recommended for adults aged 45-80 years who are at high risk for developing lung cancer because of a history of smoking. A yearly low-dose CT scan of the lungs is recommended for people who have at least a 30-pack-year history of smoking and are a current smoker or have quit within the past 15 years. A pack year of smoking is smoking an average of 1 pack of cigarettes a day for 1 year (for example: 1 pack a day for 30 years or 2 packs a day for 15 years). Yearly screening should continue until the smoker has stopped smoking for at least 15 years. Yearly screening should be stopped for people who develop a health problem that would prevent them from having lung cancer treatment.  If you are pregnant, do not drink alcohol. If you are  breastfeeding, be very cautious about drinking alcohol. If you are not pregnant and choose to drink alcohol, do not have more than 1 drink per day. One drink is considered to be 12 ounces (355 mL) of beer, 5 ounces (148 mL) of wine, or 1.5 ounces (44 mL) of liquor.  Avoid use of street drugs. Do not share needles with anyone. Ask for help if you need support or instructions about stopping the use of drugs.  High blood pressure causes heart disease and increases the risk  of stroke. Your blood pressure should be checked at least every 1 to 2 years. Ongoing high blood pressure should be treated with medicines if weight loss and exercise do not work.  If you are 102-45 years old, ask your health care provider if you should take aspirin to prevent strokes.  Diabetes screening is done by taking a blood sample to check your blood glucose level after you have not eaten for a certain period of time (fasting). If you are not overweight and you do not have risk factors for diabetes, you should be screened once every 3 years starting at age 9. If you are overweight or obese and you are 67-73 years of age, you should be screened for diabetes every year as part of your cardiovascular risk assessment.  Breast cancer screening is essential preventive care for women. You should practice "breast self-awareness." This means understanding the normal appearance and feel of your breasts and may include breast self-examination. Any changes detected, no matter how small, should be reported to a health care provider. Women in their 23s and 30s should have a clinical breast exam (CBE) by a health care provider as part of a regular health exam every 1 to 3 years. After age 57, women should have a CBE every year. Starting at age 76, women should consider having a mammogram (breast X-ray test) every year. Women who have a family history of breast cancer should talk to their health care provider about genetic screening. Women at a high risk of breast cancer should talk to their health care providers about having an MRI and a mammogram every year.  Breast cancer gene (BRCA)-related cancer risk assessment is recommended for women who have family members with BRCA-related cancers. BRCA-related cancers include breast, ovarian, tubal, and peritoneal cancers. Having family members with these cancers may be associated with an increased risk for harmful changes (mutations) in the breast cancer genes BRCA1 and  BRCA2. Results of the assessment will determine the need for genetic counseling and BRCA1 and BRCA2 testing.  Your health care provider may recommend that you be screened regularly for cancer of the pelvic organs (ovaries, uterus, and vagina). This screening involves a pelvic examination, including checking for microscopic changes to the surface of your cervix (Pap test). You may be encouraged to have this screening done every 3 years, beginning at age 75.  For women ages 3-65, health care providers may recommend pelvic exams and Pap testing every 3 years, or they may recommend the Pap and pelvic exam, combined with testing for human papilloma virus (HPV), every 5 years. Some types of HPV increase your risk of cervical cancer. Testing for HPV may also be done on women of any age with unclear Pap test results.  Other health care providers may not recommend any screening for nonpregnant women who are considered low risk for pelvic cancer and who do not have symptoms. Ask your health care provider if a screening pelvic exam is right for  you.  If you have had past treatment for cervical cancer or a condition that could lead to cancer, you need Pap tests and screening for cancer for at least 20 years after your treatment. If Pap tests have been discontinued, your risk factors (such as having a new sexual partner) need to be reassessed to determine if screening should resume. Some women have medical problems that increase the chance of getting cervical cancer. In these cases, your health care provider may recommend more frequent screening and Pap tests.  Colorectal cancer can be detected and often prevented. Most routine colorectal cancer screening begins at the age of 50 years and continues through age 75 years. However, your health care provider may recommend screening at an earlier age if you have risk factors for colon cancer. On a yearly basis, your health care provider may provide home test kits to check  for hidden blood in the stool. Use of a small camera at the end of a tube, to directly examine the colon (sigmoidoscopy or colonoscopy), can detect the earliest forms of colorectal cancer. Talk to your health care provider about this at age 50, when routine screening begins. Direct exam of the colon should be repeated every 5-10 years through age 75 years, unless early forms of precancerous polyps or small growths are found.  People who are at an increased risk for hepatitis B should be screened for this virus. You are considered at high risk for hepatitis B if:  You were born in a country where hepatitis B occurs often. Talk with your health care provider about which countries are considered high risk.  Your parents were born in a high-risk country and you have not received a shot to protect against hepatitis B (hepatitis B vaccine).  You have HIV or AIDS.  You use needles to inject street drugs.  You live with, or have sex with, someone who has hepatitis B.  You get hemodialysis treatment.  You take certain medicines for conditions like cancer, organ transplantation, and autoimmune conditions.  Hepatitis C blood testing is recommended for all people born from 1945 through 1965 and any individual with known risks for hepatitis C.  Practice safe sex. Use condoms and avoid high-risk sexual practices to reduce the spread of sexually transmitted infections (STIs). STIs include gonorrhea, chlamydia, syphilis, trichomonas, herpes, HPV, and human immunodeficiency virus (HIV). Herpes, HIV, and HPV are viral illnesses that have no cure. They can result in disability, cancer, and death.  You should be screened for sexually transmitted illnesses (STIs) including gonorrhea and chlamydia if:  You are sexually active and are younger than 24 years.  You are older than 24 years and your health care provider tells you that you are at risk for this type of infection.  Your sexual activity has changed  since you were last screened and you are at an increased risk for chlamydia or gonorrhea. Ask your health care provider if you are at risk.  If you are at risk of being infected with HIV, it is recommended that you take a prescription medicine daily to prevent HIV infection. This is called preexposure prophylaxis (PrEP). You are considered at risk if:  You are sexually active and do not regularly use condoms or know the HIV status of your partner(s).  You take drugs by injection.  You are sexually active with a partner who has HIV.  Talk with your health care provider about whether you are at high risk of being infected with HIV. If   you choose to begin PrEP, you should first be tested for HIV. You should then be tested every 3 months for as long as you are taking PrEP.  Osteoporosis is a disease in which the bones lose minerals and strength with aging. This can result in serious bone fractures or breaks. The risk of osteoporosis can be identified using a bone density scan. Women ages 67 years and over and women at risk for fractures or osteoporosis should discuss screening with their health care providers. Ask your health care provider whether you should take a calcium supplement or vitamin D to reduce the rate of osteoporosis.  Menopause can be associated with physical symptoms and risks. Hormone replacement therapy is available to decrease symptoms and risks. You should talk to your health care provider about whether hormone replacement therapy is right for you.  Use sunscreen. Apply sunscreen liberally and repeatedly throughout the day. You should seek shade when your shadow is shorter than you. Protect yourself by wearing long sleeves, pants, a wide-brimmed hat, and sunglasses year round, whenever you are outdoors.  Once a month, do a whole body skin exam, using a mirror to look at the skin on your back. Tell your health care provider of new moles, moles that have irregular borders, moles that  are larger than a pencil eraser, or moles that have changed in shape or color.  Stay current with required vaccines (immunizations).  Influenza vaccine. All adults should be immunized every year.  Tetanus, diphtheria, and acellular pertussis (Td, Tdap) vaccine. Pregnant women should receive 1 dose of Tdap vaccine during each pregnancy. The dose should be obtained regardless of the length of time since the last dose. Immunization is preferred during the 27th-36th week of gestation. An adult who has not previously received Tdap or who does not know her vaccine status should receive 1 dose of Tdap. This initial dose should be followed by tetanus and diphtheria toxoids (Td) booster doses every 10 years. Adults with an unknown or incomplete history of completing a 3-dose immunization series with Td-containing vaccines should begin or complete a primary immunization series including a Tdap dose. Adults should receive a Td booster every 10 years.  Varicella vaccine. An adult without evidence of immunity to varicella should receive 2 doses or a second dose if she has previously received 1 dose. Pregnant females who do not have evidence of immunity should receive the first dose after pregnancy. This first dose should be obtained before leaving the health care facility. The second dose should be obtained 4-8 weeks after the first dose.  Human papillomavirus (HPV) vaccine. Females aged 13-26 years who have not received the vaccine previously should obtain the 3-dose series. The vaccine is not recommended for use in pregnant females. However, pregnancy testing is not needed before receiving a dose. If a female is found to be pregnant after receiving a dose, no treatment is needed. In that case, the remaining doses should be delayed until after the pregnancy. Immunization is recommended for any person with an immunocompromised condition through the age of 61 years if she did not get any or all doses earlier. During the  3-dose series, the second dose should be obtained 4-8 weeks after the first dose. The third dose should be obtained 24 weeks after the first dose and 16 weeks after the second dose.  Zoster vaccine. One dose is recommended for adults aged 30 years or older unless certain conditions are present.  Measles, mumps, and rubella (MMR) vaccine. Adults born  before 1957 generally are considered immune to measles and mumps. Adults born in 1957 or later should have 1 or more doses of MMR vaccine unless there is a contraindication to the vaccine or there is laboratory evidence of immunity to each of the three diseases. A routine second dose of MMR vaccine should be obtained at least 28 days after the first dose for students attending postsecondary schools, health care workers, or international travelers. People who received inactivated measles vaccine or an unknown type of measles vaccine during 1963-1967 should receive 2 doses of MMR vaccine. People who received inactivated mumps vaccine or an unknown type of mumps vaccine before 1979 and are at high risk for mumps infection should consider immunization with 2 doses of MMR vaccine. For females of childbearing age, rubella immunity should be determined. If there is no evidence of immunity, females who are not pregnant should be vaccinated. If there is no evidence of immunity, females who are pregnant should delay immunization until after pregnancy. Unvaccinated health care workers born before 1957 who lack laboratory evidence of measles, mumps, or rubella immunity or laboratory confirmation of disease should consider measles and mumps immunization with 2 doses of MMR vaccine or rubella immunization with 1 dose of MMR vaccine.  Pneumococcal 13-valent conjugate (PCV13) vaccine. When indicated, a person who is uncertain of his immunization history and has no record of immunization should receive the PCV13 vaccine. All adults 65 years of age and older should receive this  vaccine. An adult aged 19 years or older who has certain medical conditions and has not been previously immunized should receive 1 dose of PCV13 vaccine. This PCV13 should be followed with a dose of pneumococcal polysaccharide (PPSV23) vaccine. Adults who are at high risk for pneumococcal disease should obtain the PPSV23 vaccine at least 8 weeks after the dose of PCV13 vaccine. Adults older than 67 years of age who have normal immune system function should obtain the PPSV23 vaccine dose at least 1 year after the dose of PCV13 vaccine.  Pneumococcal polysaccharide (PPSV23) vaccine. When PCV13 is also indicated, PCV13 should be obtained first. All adults aged 65 years and older should be immunized. An adult younger than age 65 years who has certain medical conditions should be immunized. Any person who resides in a nursing home or long-term care facility should be immunized. An adult smoker should be immunized. People with an immunocompromised condition and certain other conditions should receive both PCV13 and PPSV23 vaccines. People with human immunodeficiency virus (HIV) infection should be immunized as soon as possible after diagnosis. Immunization during chemotherapy or radiation therapy should be avoided. Routine use of PPSV23 vaccine is not recommended for American Indians, [place] Natives, or people younger than 65 years unless there are medical conditions that require PPSV23 vaccine. When indicated, people who have unknown immunization and have no record of immunization should receive PPSV23 vaccine. One-time revaccination 5 years after the first dose of PPSV23 is recommended for people aged 19-64 years who have chronic kidney failure, nephrotic syndrome, asplenia, or immunocompromised conditions. People who received 1-2 doses of PPSV23 before age 65 years should receive another dose of PPSV23 vaccine at age 65 years or later if at least 5 years have passed since the previous dose. Doses of PPSV23 are not  needed for people immunized with PPSV23 at or after age 65 years.  Meningococcal vaccine. Adults with asplenia or persistent complement component deficiencies should receive 2 doses of quadrivalent meningococcal conjugate (MenACWY-D) vaccine. The doses should be obtained   at least 2 months apart. Microbiologists working with certain meningococcal bacteria, [place] recruits, people at risk during an outbreak, and people who travel to or live in countries with a high rate of meningitis should be immunized. A first-year college student up through age 34 years who is living in a residence hall should receive a dose if she did not receive a dose on or after her 16th birthday. Adults who have certain high-risk conditions should receive one or more doses of vaccine.  Hepatitis A vaccine. Adults who wish to be protected from this disease, have certain high-risk conditions, work with hepatitis A-infected animals, work in hepatitis A research labs, or travel to or work in countries with a high rate of hepatitis A should be immunized. Adults who were previously unvaccinated and who anticipate close contact with an international adoptee during the first 60 days after arrival in the [place] from a country with a high rate of hepatitis A should be immunized.  Hepatitis B vaccine. Adults who wish to be protected from this disease, have certain high-risk conditions, may be exposed to blood or other infectious body fluids, are household contacts or sex partners of hepatitis B positive people, are clients or workers in certain care facilities, or travel to or work in countries with a high rate of hepatitis B should be immunized.  Haemophilus influenzae type b (Hib) vaccine. A previously unvaccinated person with asplenia or sickle cell disease or having a scheduled splenectomy should receive 1 dose of Hib vaccine. Regardless of previous immunization, a recipient of a hematopoietic stem cell transplant should receive a  3-dose series 6-12 months after her successful transplant. Hib vaccine is not recommended for adults with HIV infection. Preventive Services / Frequency Ages 35 to 4 years  Blood pressure check.** / Every 3-5 years.  Lipid and cholesterol check.** / Every 5 years beginning at age 60.  Clinical breast exam.** / Every 3 years for women in their 71s and 10s.  BRCA-related cancer risk assessment.** / For women who have family members with a BRCA-related cancer (breast, ovarian, tubal, or peritoneal cancers).  Pap test.** / Every 2 years from ages 76 through 26. Every 3 years starting at age 61 through age 76 or 93 with a history of 3 consecutive normal Pap tests.  HPV screening.** / Every 3 years from ages 37 through ages 60 to 51 with a history of 3 consecutive normal Pap tests.  Hepatitis C blood test.** / For any individual with known risks for hepatitis C.  Skin self-exam. / Monthly.  Influenza vaccine. / Every year.  Tetanus, diphtheria, and acellular pertussis (Tdap, Td) vaccine.** / Consult your health care provider. Pregnant women should receive 1 dose of Tdap vaccine during each pregnancy. 1 dose of Td every 10 years.  Varicella vaccine.** / Consult your health care provider. Pregnant females who do not have evidence of immunity should receive the first dose after pregnancy.  HPV vaccine. / 3 doses over 6 months, if 93 and younger. The vaccine is not recommended for use in pregnant females. However, pregnancy testing is not needed before receiving a dose.  Measles, mumps, rubella (MMR) vaccine.** / You need at least 1 dose of MMR if you were born in 1957 or later. You may also need a 2nd dose. For females of childbearing age, rubella immunity should be determined. If there is no evidence of immunity, females who are not pregnant should be vaccinated. If there is no evidence of immunity, females who are  pregnant should delay immunization until after pregnancy.  Pneumococcal  13-valent conjugate (PCV13) vaccine.** / Consult your health care provider.  Pneumococcal polysaccharide (PPSV23) vaccine.** / 1 to 2 doses if you smoke cigarettes or if you have certain conditions.  Meningococcal vaccine.** / 1 dose if you are age 68 to 8 years and a Market researcher living in a residence hall, or have one of several medical conditions, you need to get vaccinated against meningococcal disease. You may also need additional booster doses.  Hepatitis A vaccine.** / Consult your health care provider.  Hepatitis B vaccine.** / Consult your health care provider.  Haemophilus influenzae type b (Hib) vaccine.** / Consult your health care provider. Ages 7 to 53 years  Blood pressure check.** / Every year.  Lipid and cholesterol check.** / Every 5 years beginning at age 25 years.  Lung cancer screening. / Every year if you are aged 11-80 years and have a 30-pack-year history of smoking and currently smoke or have quit within the past 15 years. Yearly screening is stopped once you have quit smoking for at least 15 years or develop a health problem that would prevent you from having lung cancer treatment.  Clinical breast exam.** / Every year after age 48 years.  BRCA-related cancer risk assessment.** / For women who have family members with a BRCA-related cancer (breast, ovarian, tubal, or peritoneal cancers).  Mammogram.** / Every year beginning at age 41 years and continuing for as long as you are in good health. Consult with your health care provider.  Pap test.** / Every 3 years starting at age 65 years through age 37 or 70 years with a history of 3 consecutive normal Pap tests.  HPV screening.** / Every 3 years from ages 72 years through ages 60 to 40 years with a history of 3 consecutive normal Pap tests.  Fecal occult blood test (FOBT) of stool. / Every year beginning at age 21 years and continuing until age 5 years. You may not need to do this test if you get  a colonoscopy every 10 years.  Flexible sigmoidoscopy or colonoscopy.** / Every 5 years for a flexible sigmoidoscopy or every 10 years for a colonoscopy beginning at age 35 years and continuing until age 48 years.  Hepatitis C blood test.** / For all people born from [birth year] through 1965 and any individual with known risks for hepatitis C.  Skin self-exam. / Monthly.  Influenza vaccine. / Every year.  Tetanus, diphtheria, and acellular pertussis (Tdap/Td) vaccine.** / Consult your health care provider. Pregnant women should receive 1 dose of Tdap vaccine during each pregnancy. 1 dose of Td every 10 years.  Varicella vaccine.** / Consult your health care provider. Pregnant females who do not have evidence of immunity should receive the first dose after pregnancy.  Zoster vaccine.** / 1 dose for adults aged 30 years or older.  Measles, mumps, rubella (MMR) vaccine.** / You need at least 1 dose of MMR if you were born in 1957 or later. You may also need a second dose. For females of childbearing age, rubella immunity should be determined. If there is no evidence of immunity, females who are not pregnant should be vaccinated. If there is no evidence of immunity, females who are pregnant should delay immunization until after pregnancy.  Pneumococcal 13-valent conjugate (PCV13) vaccine.** / Consult your health care provider.  Pneumococcal polysaccharide (PPSV23) vaccine.** / 1 to 2 doses if you smoke cigarettes or if you have certain conditions.  Meningococcal vaccine.** /  Consult your health care provider.  Hepatitis A vaccine.** / Consult your health care provider.  Hepatitis B vaccine.** / Consult your health care provider.  Haemophilus influenzae type b (Hib) vaccine.** / Consult your health care provider. Ages 96 years and over  Blood pressure check.** / Every year.  Lipid and cholesterol check.** / Every 5 years beginning at age 37 years.  Lung cancer screening. / Every year if you  are aged 42-80 years and have a 30-pack-year history of smoking and currently smoke or have quit within the past 15 years. Yearly screening is stopped once you have quit smoking for at least 15 years or develop a health problem that would prevent you from having lung cancer treatment.  Clinical breast exam.** / Every year after age 58 years.  BRCA-related cancer risk assessment.** / For women who have family members with a BRCA-related cancer (breast, ovarian, tubal, or peritoneal cancers).  Mammogram.** / Every year beginning at age 76 years and continuing for as long as you are in good health. Consult with your health care provider.  Pap test.** / Every 3 years starting at age 35 years through age 76 or 18 years with 3 consecutive normal Pap tests. Testing can be stopped between 65 and 70 years with 3 consecutive normal Pap tests and no abnormal Pap or HPV tests in the past 10 years.  HPV screening.** / Every 3 years from ages 69 years through ages 3 or 66 years with a history of 3 consecutive normal Pap tests. Testing can be stopped between 65 and 70 years with 3 consecutive normal Pap tests and no abnormal Pap or HPV tests in the past 10 years.  Fecal occult blood test (FOBT) of stool. / Every year beginning at age 40 years and continuing until age 33 years. You may not need to do this test if you get a colonoscopy every 10 years.  Flexible sigmoidoscopy or colonoscopy.** / Every 5 years for a flexible sigmoidoscopy or every 10 years for a colonoscopy beginning at age 69 years and continuing until age 8 years.  Hepatitis C blood test.** / For all people born from [birth year] through 1965 and any individual with known risks for hepatitis C.  Osteoporosis screening.** / A one-time screening for women ages 26 years and over and women at risk for fractures or osteoporosis.  Skin self-exam. / Monthly.  Influenza vaccine. / Every year.  Tetanus, diphtheria, and acellular pertussis (Tdap/Td)  vaccine.** / 1 dose of Td every 10 years.  Varicella vaccine.** / Consult your health care provider.  Zoster vaccine.** / 1 dose for adults aged 59 years or older.  Pneumococcal 13-valent conjugate (PCV13) vaccine.** / Consult your health care provider.  Pneumococcal polysaccharide (PPSV23) vaccine.** / 1 dose for all adults aged 84 years and older.  Meningococcal vaccine.** / Consult your health care provider.  Hepatitis A vaccine.** / Consult your health care provider.  Hepatitis B vaccine.** / Consult your health care provider.  Haemophilus influenzae type b (Hib) vaccine.** / Consult your health care provider. ** Family history and personal history of risk and conditions may change your health care provider's recommendations.   This information is not intended to replace advice given to you by your health care provider. Make sure you discuss any questions you have with your health care provider.   Document Released: 05/04/2001 Document Revised: 03/29/2014 Document Reviewed: 08/03/2010 Elsevier Interactive Patient Education 2016 [employer].   [employer]

## 2015-02-24 NOTE — Progress Notes (Signed)
Pre visit review using our clinic review tool, if applicable. No additional management support is needed unless otherwise documented below in the visit note. 

## 2015-02-25 ENCOUNTER — Ambulatory Visit
Admission: RE | Admit: 2015-02-25 | Discharge: 2015-02-25 | Disposition: A | Discharge status: Home / Self Care | Payer: Medicare Other | Source: Ambulatory Visit | Attending: Surgery | Admitting: Surgery

## 2015-02-25 DIAGNOSIS — E042 Nontoxic multinodular goiter: Secondary | ICD-10-CM | POA: Diagnosis not present

## 2015-03-03 ENCOUNTER — Ambulatory Visit: Payer: Medicare Other | Attending: Orthopaedic Surgery | Admitting: Physical Therapy

## 2015-03-03 ENCOUNTER — Encounter: Payer: Self-pay | Admitting: Physical Therapy

## 2015-03-03 DIAGNOSIS — M542 Cervicalgia: Secondary | ICD-10-CM | POA: Insufficient documentation

## 2015-03-03 NOTE — Therapy (Signed)
[facility] [address] [place], [place], [address] Phone: [phone]   Fax:  [phone]  Physical Therapy Treatment  Patient Details  Name: Evelyn Murphy MRN: [id number] Date of Birth: 23-Nov-1945 Referring Provider: Joni Fears, MD  Encounter Date: 03/03/2015      PT End of Session - 03/03/15 1111    Visit Number 2   Date for PT Re-Evaluation 03/25/15   PT Start Time 1108  Slightly late and traction untimed   PT Stop Time 1146   PT Time Calculation (min) 38 min   Activity Tolerance Patient tolerated treatment well   Behavior During Therapy [facility] for tasks assessed/performed      Past Medical History  Diagnosis Date  . Hyperlipidemia   . Anxiety   . Insomnia   . RBBB     Past Surgical History  Procedure Laterality Date  . Shoulder arthroscopy  10/2011    R Dr Meyer Cory  . Colonoscopy      There were no vitals filed for this visit.  Visit Diagnosis:  Neck pain      Subjective Assessment - 03/03/15 1109    Subjective Pain at times traveling lower than scapula intermittently. Doing exercises but no signifcantl changes.    Currently in Pain? No/denies   Aggravating Factors  bending over   Pain Relieving Factors not bendin over   Multiple Pain Sites No                         [facility] Adult PT Treatment/Exercise - 03/03/15 0001    Traction   Type of Traction Cervical   Min (lbs) 14   Max (lbs) 5   Hold Time 60   Rest Time 20   Time 10   Manual Therapy   Manual Therapy --  Soft tissue to RT SCM                PT Education - 03/03/15 1131    Education provided Yes   Education Details review of initial HEP, added SCM stretch for RT   Person(s) Educated Patient   Methods Demonstration   Comprehension Returned demonstration          PT Short Term Goals - 03/03/15 1139    PT SHORT TERM GOAL #1   Title pt will be independent with initial HEP   Time 2   Period Weeks   Status New           PT Long Term Goals - 02/11/15 1735    PT LONG TERM GOAL #1   Title pt will be independent with final HEP   Time 6   Period Weeks   Status New   PT LONG TERM GOAL #2   Title pt will report no R sided UT pain during daily activities   Time 6   Period Weeks   Status New   PT LONG TERM GOAL #3   Title pt will have painfree cervical AROM   Time 6   Period Weeks   Status New               Plan - 03/03/15 1136    Clinical Impression Statement Pt only on second visit so there are no significant improvements. In fact, pt has noticed that she intermittently has lower symptoms at her RT shoulder blade. She continues to be aggrevated by bending over/forward head flexion. Added RT SCM srteching to HEP today and  initiated cervical traction.    Pt will benefit from skilled therapeutic intervention in order to improve on the following deficits Pain;Hypomobility;Decreased mobility;Decreased range of motion   Rehab Potential Excellent   PT Frequency 2x / week   PT Duration 6 weeks   PT Treatment/Interventions Electrical Stimulation;Moist Heat;Traction;Therapeutic exercise;Neuromuscular re-education;Manual techniques   PT Next Visit Plan Discuss effects of traction, review SCM stretch and manual work to it.    Consulted and Agree with Plan of Care Patient        Problem List Patient Active Problem List   Diagnosis Date Noted  . Dyslipidemia 02/24/2015  . Cough 11/27/2014  . Phalanx, proximal fracture of finger 10/15/2014  . Anemia 10/03/2014  . Finger sprain 08/12/2014  . Quadriceps tendinitis 07/22/2014  . Constipation 07/04/2014  . Altered mental status   . Slurred speech 06/29/2014  . Unsteady gait 06/29/2014  . Confusion 06/29/2014  . TIA (transient ischemic attack) 06/29/2014  . Urinary frequency 01/31/2014  . Diastolic dysfunction [id number]  . Chest pain 11/22/2013  . Left shoulder pain 11/22/2013  . Unspecified constipation  07/31/2013  . GERD (gastroesophageal reflux disease) 01/24/2013  . Asthma 07/22/2012  . Nonspecific abnormal electrocardiogram (ECG) (EKG) 07/10/2012  . Well adult exam 07/10/2012  . Abnormal CBC 01/11/2012  . Cerumen impaction 07/06/2011  . Shoulder pain, right 07/06/2011  . DYSPHAGIA UNSPECIFIED 03/04/2010  . PELVIC PAIN, LEFT 07/18/2008  . RENAL CYST 05/14/2008  . ABNORMAL LIVER FUNCTION TESTS 04/30/2008  . Hyperlipidemia 02/06/2007  . INSOMNIA, PERSISTENT 02/06/2007  . Depression with anxiety 12/02/2006    Izzy Courville,PTA 03/03/2015, 11:48 AM  [facility] [address] [place], [place], [address] Phone: [phone]   Fax:  [phone]  Name: Evelyn Murphy MRN: [id number] Date of Birth: 1945-07-16

## 2015-03-03 NOTE — Patient Instructions (Signed)
NECK TENSION: Extension Stretch    Bring both hands to shoulders by sides of neck, fingers facing back. Inhaling look up then tilt head to the left diagonal. Settle the right shoulder and collar bone and visualize the front RT neck relaxing and softening. Breathe and relax about 3-4 breaths.  Repeat __3_ times. Do _2-3__ times per day.  Copyright  VHI. All rights reserved.

## 2015-03-05 ENCOUNTER — Ambulatory Visit: Payer: Medicare Other | Admitting: Physical Therapy

## 2015-03-05 ENCOUNTER — Encounter: Payer: Self-pay | Admitting: Physical Therapy

## 2015-03-05 DIAGNOSIS — M542 Cervicalgia: Secondary | ICD-10-CM | POA: Diagnosis not present

## 2015-03-05 NOTE — Therapy (Addendum)
[facility] [address] [place], [place], [address] Phone: [phone]   Fax:  [phone]  Physical Therapy Treatment  Patient Details  Name: Evelyn Murphy MRN: [id number] Date of Birth: 05-06-1945 Referring Provider: Joni Fears, MD  Encounter Date: 03/05/2015      PT End of Session - 03/05/15 1104    Visit Number 3   Date for PT Re-Evaluation 03/25/15   PT Start Time 1058   PT Stop Time 1145   PT Time Calculation (min) 47 min   Activity Tolerance Patient tolerated treatment well   Behavior During Therapy [facility] for tasks assessed/performed      Past Medical History  Diagnosis Date  . Hyperlipidemia   . Anxiety   . Insomnia   . RBBB     Past Surgical History  Procedure Laterality Date  . Shoulder arthroscopy  10/2011    R Dr Meyer Cory  . Colonoscopy      There were no vitals filed for this visit.  Visit Diagnosis:  Neck pain      Subjective Assessment - 03/05/15 1103    Subjective Feeling better since last treatment.    Currently in Pain? Yes   Pain Score 2    Pain Location Scapula   Pain Orientation Right   Pain Descriptors / Indicators Aching   Aggravating Factors  Bending forward   Pain Relieving Factors Not bending over, traction   Multiple Pain Sites No       Patient came in on 03/13/2015 reporting she is feeling better and wants to be discharged therefore Therapist feels she is 10% limitation.  Functional limitation is carrying, moving and handling objects, Goal status is CI and discharge status is CI. Earlie Counts, PT 03/13/2015 11:20 AM                    [facility] Adult PT Treatment/Exercise - 03/05/15 0001    Shoulder Exercises: ROM/Strengthening   UBE (Upper Arm Bike) With lumbar support, L1 3x3   postural focus   Traction   Type of Traction Cervical   Min (lbs) 14   Max (lbs) 5   Hold Time 60   Rest Time 20   Time 15   Manual Therapy   Manual Therapy --   Soft tissue to Bil SCM                PT Education - 03/05/15 1132    Education provided Yes   Education Details Review of SCM stretch, pt could do correctly   Person(s) Educated Patient   Methods Demonstration   Comprehension Returned demonstration          PT Short Term Goals - 03/05/15 1137    PT SHORT TERM GOAL #1   Title pt will be independent with initial HEP   Time 2   Period Weeks   Status Achieved           PT Long Term Goals - 02/11/15 1735    PT LONG TERM GOAL #1   Title pt will be independent with final HEP   Time 6   Period Weeks   Status New   PT LONG TERM GOAL #2   Title pt will report no R sided UT pain during daily activities   Time 6   Period Weeks   Status New   PT LONG TERM GOAL #3   Title pt will have painfree cervical AROM   Time 6  Period Weeks   Status New               Plan - 03/05/15 1133    Clinical Impression Statement pt reports the first treatment was noticable, making her pain/symptoms lessen and stay. She reports she can even bend forward some without pain.  We continued with manual work to [employer] as both were very dense today. We increased the traction time to the full 15 min. Short term goals met today.     Pt will benefit from skilled therapeutic intervention in order to improve on the following deficits Pain;Hypomobility;Decreased mobility;Decreased range of motion   Rehab Potential Excellent   PT Frequency 2x / week   PT Duration 6 weeks   PT Treatment/Interventions Electrical Stimulation;Moist Heat;Traction;Therapeutic exercise;Neuromuscular re-education;Manual techniques   PT Next Visit Plan Increase traction, continue with arm bike for posture/endurance, consider starting some scap stabs in supine next.   Consulted and Agree with Plan of Care Patient        Problem List Patient Active Problem List   Diagnosis Date Noted  . Dyslipidemia 02/24/2015  . Cough 11/27/2014  . Phalanx, proximal  fracture of finger 10/15/2014  . Anemia 10/03/2014  . Finger sprain 08/12/2014  . Quadriceps tendinitis 07/22/2014  . Constipation 07/04/2014  . Altered mental status   . Slurred speech 06/29/2014  . Unsteady gait 06/29/2014  . Confusion 06/29/2014  . TIA (transient ischemic attack) 06/29/2014  . Urinary frequency 01/31/2014  . Diastolic dysfunction 96/06/5407  . Chest pain 11/22/2013  . Left shoulder pain 11/22/2013  . Unspecified constipation 07/31/2013  . GERD (gastroesophageal reflux disease) 01/24/2013  . Asthma 07/22/2012  . Nonspecific abnormal electrocardiogram (ECG) (EKG) 07/10/2012  . Well adult exam 07/10/2012  . Abnormal CBC 01/11/2012  . Cerumen impaction 07/06/2011  . Shoulder pain, right 07/06/2011  . DYSPHAGIA UNSPECIFIED 03/04/2010  . PELVIC PAIN, LEFT 07/18/2008  . RENAL CYST 05/14/2008  . ABNORMAL LIVER FUNCTION TESTS 04/30/2008  . Hyperlipidemia 02/06/2007  . INSOMNIA, PERSISTENT 02/06/2007  . Depression with anxiety 12/02/2006    Nayely Dingus, PTA 03/05/2015, 11:39 AM  [facility] [address], [place], [place], [address] Phone: [phone]   Fax:  [phone]  Name: Evelyn Murphy MRN: [id number] Date of Birth: 1945-05-29  PHYSICAL THERAPY DISCHARGE SUMMARY  Visits from Start of Care: 3  Current functional level related to goals / functional outcomes: Unable to assess patient due to her coming in on 03/13/2015 and wanting to cancel and be discharged but did not want to be treated on that day.  See above.   Remaining deficits: See above.   Education / Equipment: HEP  Plan: Patient agrees to discharge.  Patient goals were partially met. Patient is being discharged due to the patient's request. Thank you for the referral. Earlie Counts, PT 03/13/2015 11:23 AM   ?????

## 2015-03-10 ENCOUNTER — Ambulatory Visit: Payer: Medicare Other | Admitting: Physical Therapy

## 2015-03-11 ENCOUNTER — Other Ambulatory Visit: Payer: Self-pay | Admitting: Surgery

## 2015-03-11 DIAGNOSIS — E213 Hyperparathyroidism, unspecified: Secondary | ICD-10-CM | POA: Diagnosis not present

## 2015-03-11 DIAGNOSIS — E042 Nontoxic multinodular goiter: Secondary | ICD-10-CM | POA: Diagnosis not present

## 2015-03-11 DIAGNOSIS — E041 Nontoxic single thyroid nodule: Secondary | ICD-10-CM

## 2015-03-11 DIAGNOSIS — E0590 Thyrotoxicosis, unspecified without thyrotoxic crisis or storm: Secondary | ICD-10-CM

## 2015-03-13 ENCOUNTER — Ambulatory Visit: Payer: Medicare Other | Admitting: Physical Therapy

## 2015-04-03 ENCOUNTER — Other Ambulatory Visit: Payer: Medicare Other | Admitting: *Deleted

## 2015-04-03 ENCOUNTER — Other Ambulatory Visit (INDEPENDENT_AMBULATORY_CARE_PROVIDER_SITE_OTHER): Payer: Medicare Other

## 2015-04-03 ENCOUNTER — Ambulatory Visit
Admission: RE | Admit: 2015-04-03 | Discharge: 2015-04-03 | Disposition: A | Discharge status: Home / Self Care | Payer: Medicare Other | Source: Ambulatory Visit | Attending: Surgery | Admitting: Surgery

## 2015-04-03 ENCOUNTER — Other Ambulatory Visit (HOSPITAL_COMMUNITY)
Admission: RE | Admit: 2015-04-03 | Discharge: 2015-04-03 | Disposition: A | Discharge status: Home / Self Care | Payer: Medicare Other | Source: Ambulatory Visit | Attending: Interventional Radiology | Admitting: Interventional Radiology

## 2015-04-03 DIAGNOSIS — E785 Hyperlipidemia, unspecified: Secondary | ICD-10-CM | POA: Diagnosis not present

## 2015-04-03 DIAGNOSIS — E041 Nontoxic single thyroid nodule: Secondary | ICD-10-CM

## 2015-04-03 DIAGNOSIS — Z0000 Encounter for general adult medical examination without abnormal findings: Secondary | ICD-10-CM

## 2015-04-03 DIAGNOSIS — D6489 Other specified anemias: Secondary | ICD-10-CM

## 2015-04-03 DIAGNOSIS — E0590 Thyrotoxicosis, unspecified without thyrotoxic crisis or storm: Secondary | ICD-10-CM

## 2015-04-03 DIAGNOSIS — K5901 Slow transit constipation: Secondary | ICD-10-CM

## 2015-04-03 LAB — URINALYSIS
BILIRUBIN URINE: NEGATIVE
HGB URINE DIPSTICK: NEGATIVE
KETONES UR: NEGATIVE
LEUKOCYTES UA: NEGATIVE
Nitrite: NEGATIVE
SPECIFIC GRAVITY, URINE: 1.015 (ref 1.000–1.030)
Total Protein, Urine: NEGATIVE
URINE GLUCOSE: NEGATIVE
UROBILINOGEN UA: 0.2 (ref 0.0–1.0)
pH: 7.5 (ref 5.0–8.0)

## 2015-04-03 LAB — CBC WITH DIFFERENTIAL/PLATELET
BASOS PCT: 0.7 % (ref 0.0–3.0)
Basophils Absolute: 0 10*3/uL (ref 0.0–0.1)
EOS ABS: 0.3 10*3/uL (ref 0.0–0.7)
Eosinophils Relative: 5.4 % — ABNORMAL HIGH (ref 0.0–5.0)
HEMATOCRIT: 41.7 % (ref 36.0–46.0)
Hemoglobin: 13.7 g/dL (ref 12.0–15.0)
LYMPHS ABS: 1.9 10*3/uL (ref 0.7–4.0)
Lymphocytes Relative: 34 % (ref 12.0–46.0)
MCHC: 32.8 g/dL (ref 30.0–36.0)
MCV: 87.4 fl (ref 78.0–100.0)
MONO ABS: 0.4 10*3/uL (ref 0.1–1.0)
Monocytes Relative: 7.9 % (ref 3.0–12.0)
NEUTROS ABS: 2.9 10*3/uL (ref 1.4–7.7)
Neutrophils Relative %: 52 % (ref 43.0–77.0)
Platelets: 329 10*3/uL (ref 150.0–400.0)
RBC: 4.77 Mil/uL (ref 3.87–5.11)
RDW: 12.5 % (ref 11.5–15.5)
WBC: 5.6 10*3/uL (ref 4.0–10.5)

## 2015-04-03 LAB — BASIC METABOLIC PANEL
BUN: 20 mg/dL (ref 6–23)
CALCIUM: 10.7 mg/dL — AB (ref 8.4–10.5)
CO2: 31 mEq/L (ref 19–32)
Chloride: 101 mEq/L (ref 96–112)
Creatinine, Ser: 0.95 mg/dL (ref 0.40–1.20)
GFR: 62.25 mL/min (ref >60.00)
GLUCOSE: 105 mg/dL — AB (ref 70–99)
Potassium: 4.6 mEq/L (ref 3.5–5.1)
SODIUM: 141 meq/L (ref 135–145)

## 2015-04-03 LAB — LIPID PANEL
CHOL/HDL RATIO: 3
Cholesterol: 210 mg/dL — ABNORMAL HIGH (ref 0–200)
HDL: 77.1 mg/dL (ref >39.00)
LDL Cholesterol: 106 mg/dL — ABNORMAL HIGH (ref 0–99)
NonHDL: 132.65
TRIGLYCERIDES: 135 mg/dL (ref 0.0–149.0)
VLDL: 27 mg/dL (ref 0.0–40.0)

## 2015-04-03 LAB — HEPATIC FUNCTION PANEL
ALT: 20 U/L (ref 0–35)
AST: 23 U/L (ref 0–37)
Albumin: 4.7 g/dL (ref 3.5–5.2)
Alkaline Phosphatase: 112 U/L (ref 39–117)
BILIRUBIN DIRECT: 0.1 mg/dL (ref 0.0–0.3)
BILIRUBIN TOTAL: 0.3 mg/dL (ref 0.2–1.2)
TOTAL PROTEIN: 7.8 g/dL (ref 6.0–8.3)

## 2015-04-03 LAB — TSH: TSH: 2.5 u[IU]/mL (ref 0.35–4.50)

## 2015-04-04 ENCOUNTER — Other Ambulatory Visit: Payer: Medicare Other

## 2015-04-08 ENCOUNTER — Encounter: Payer: Self-pay | Admitting: Cardiovascular Disease

## 2015-04-08 ENCOUNTER — Encounter (INDEPENDENT_AMBULATORY_CARE_PROVIDER_SITE_OTHER): Payer: Self-pay

## 2015-04-08 ENCOUNTER — Ambulatory Visit (INDEPENDENT_AMBULATORY_CARE_PROVIDER_SITE_OTHER): Payer: Medicare Other | Admitting: Cardiovascular Disease

## 2015-04-08 VITALS — BP 104/78 | HR 82 | Ht 63.5 in | Wt 149.0 lb

## 2015-04-08 DIAGNOSIS — E785 Hyperlipidemia, unspecified: Secondary | ICD-10-CM

## 2015-04-08 DIAGNOSIS — G459 Transient cerebral ischemic attack, unspecified: Secondary | ICD-10-CM | POA: Diagnosis not present

## 2015-04-08 MED ORDER — EZETIMIBE 10 MG PO TABS: 10.0000 mg | ORAL_TABLET | Freq: Every day | ORAL | Status: DC

## 2015-04-08 NOTE — Patient Instructions (Signed)
Medication Instructions:  START Zetia 10 mg once daily   Labwork: Your physician recommends that you return for lab work in: 3 months  You will need to FAST for this appointment - nothing to eat or drink after midnight the night before except water.   Testing/Procedures: None Ordered   Follow-Up: Your physician wants you to follow-up in: 1 year with Dr. [name].  You will receive a reminder letter in the mail two months in advance. If you don't receive a letter, please call our office to schedule the follow-up appointment.   If you need a refill on your cardiac medications before your next appointment, please call your pharmacy.   Thank you for choosing [facility]! Michelle Swinyer, RN [phone]   

## 2015-04-08 NOTE — Progress Notes (Signed)
Evelyn Murphy Date of Birth  07-Feb-1946       [facility]    [employer] [address], [place], [place]  [address]   [place], [place]  [address] [phone]     [phone]   Fax  [phone]    Fax [phone]  Problem List: 1. Strong family history of coronary artery disease 2. Hyperlipidemia  History of Present Illness:  Evelyn Murphy is a 70 yo with family hx of CAD ( father died at 45 of MI, mother had MI at 69, CABG x 2, brother has had multiple stents).  Both aunts diet of heart disease.  She is concerned about her risks.   She denies any CP or dyspnea.  She exercises every day - 45 minutes on treadmill, 40 minutes on stationary bike,.  She also adds weights 3-4 days a week.    She has eats a low fat diet,   Dec. 30, 2015:  Evelyn Murphy is a 70 yo with hx o hyperlipidemia and a strong family hx of CAD Her LDL partical number has decreased from 200 to 1233.  Jan. 17, 2017: Doing well. Lipids are up a bit - but she is not on Zetia any longer.    Current Outpatient Prescriptions on File Prior to Visit  Medication Sig Dispense Refill  . aspirin EC 81 MG tablet Take 1 tablet (81 mg total) by mouth daily. 30 tablet 0  . Cholecalciferol (VITAMIN D3) 2000 UNITS capsule Take 1 capsule (2,000 Units total) by mouth daily. 100 capsule 3  . citalopram (CELEXA) 40 MG tablet Take 1 tablet (40 mg total) by mouth daily. 90 tablet 3  . Fluticasone Furoate-Vilanterol (BREO ELLIPTA) 100-25 MCG/INH AEPB Inhale 1 puff into the lungs daily. 1 each 5  . lactulose (CHRONULAC) 10 GM/15ML solution Take 30-45 mLs (20-30 g total) by mouth 3 (three) times daily. 473 mL 11  . nortriptyline (PAMELOR) 75 MG capsule TAKE ONE CAPSULE BY MOUTH AT BEDTIME 90 capsule 3  . pantoprazole (PROTONIX) 40 MG tablet TAKE ONE TABLET BY MOUTH ONE TIME DAILY (Patient taking differently: TAKE ONE TABLET BY MOUTH AS NEEDED FOR HEARTBURN AND INDIGESTION) 90 tablet 3  .  rosuvastatin (CRESTOR) 10 MG tablet Take 1 tablet (10 mg total) by mouth daily. 90 tablet 3  . zolpidem (AMBIEN) 10 MG tablet Take 0.5-1 tablets (5-10 mg total) by mouth at bedtime as needed for sleep. 90 tablet 1   No current facility-administered medications on file prior to visit.    Allergies  Allergen Reactions  . Linzess [Linaclotide]     Too strong  . Lipitor [Atorvastatin]     elev LFTs    Past Medical History  Diagnosis Date  . Hyperlipidemia   . Anxiety   . Insomnia   . RBBB     Past Surgical History  Procedure Laterality Date  . Shoulder arthroscopy  10/2011    R Dr Meyer Cory  . Colonoscopy      History  Smoking status  . Never Smoker   Smokeless tobacco  . Never Used    History  Alcohol Use No    Family History  Problem Relation Age of Onset  . Heart disease Mother 76    CABG  . Diabetes Mother   . Heart disease Father     Father died of MI at 55  . COPD Father   . Heart disease Brother   . Diabetes  Brother     Reviw of Systems:  Reviewed in the HPI.  All other systems are negative.  Physical Exam: Blood pressure 104/78, pulse 82, height 5' 3.5" (1.613 m), weight 149 lb (67.586 kg). General: Well developed, well nourished, in no acute distress.  Head: Normocephalic, atraumatic, sclera non-icteric, mucus membranes are moist,   Neck: Supple. Carotids are 2 + without bruits. No JVD   Lungs: Clear   Heart: RR, normla S1, S2  Abdomen: Soft, non-tender, non-distended with normal bowel sounds.  Msk:  Strength and tone are normal   Extremities: No clubbing or cyanosis. No edema.  Distal pedal pulses are 2+ and equal    Neuro: CN II - XII intact.  Alert and oriented X 3.   Psych:  Normal   ECG: April 2014:  NSR, RBBB.      Assessment / Plan:   1. Strong family history of coronary artery disease  2. Hyperlipidemia- her LDL is back up ( off the Zetia) .   She is tolerating the crestor well. Will add Zetia 10 mg a day  Check lipids  in 3 months .  I' ll see her in a year.     Boen Sterbenz, Wonda Cheng, MD  04/08/2015 11:47 AM    [address] [place],  [place], [place]  [address] Pager [phone] Phone: [phone]; Fax: [phone]

## 2015-04-28 ENCOUNTER — Other Ambulatory Visit: Payer: Self-pay | Admitting: Internal Medicine

## 2015-04-29 NOTE — Telephone Encounter (Signed)
Pt left msg yesterday concerning refills on her zolpidem...[name]

## 2015-04-30 NOTE — Telephone Encounter (Signed)
OK to fill this zolpidem prescription with additional refills x3 Thank you!

## 2015-05-01 NOTE — Telephone Encounter (Signed)
Faxed script to [facility].../lmb 

## 2015-05-21 ENCOUNTER — Other Ambulatory Visit: Payer: Medicare Other

## 2015-07-04 ENCOUNTER — Other Ambulatory Visit (INDEPENDENT_AMBULATORY_CARE_PROVIDER_SITE_OTHER): Payer: Medicare Other | Admitting: *Deleted

## 2015-07-04 DIAGNOSIS — E785 Hyperlipidemia, unspecified: Secondary | ICD-10-CM

## 2015-07-04 LAB — LIPID PANEL
Cholesterol: 190 mg/dL (ref 125–200)
HDL: 81 mg/dL (ref >=46)
LDL Cholesterol: 84 mg/dL (ref <130)
Total CHOL/HDL Ratio: 2.3 Ratio (ref <=5.0)
Triglycerides: 126 mg/dL (ref <150)
VLDL: 25 mg/dL (ref <30)

## 2015-07-04 LAB — COMPREHENSIVE METABOLIC PANEL
ALBUMIN: 4.4 g/dL (ref 3.6–5.1)
ALT: 18 U/L (ref 6–29)
AST: 19 U/L (ref 10–35)
Alkaline Phosphatase: 94 U/L (ref 33–130)
BILIRUBIN TOTAL: 0.3 mg/dL (ref 0.2–1.2)
BUN: 22 mg/dL (ref 7–25)
CO2: 29 mmol/L (ref 20–31)
CREATININE: 0.88 mg/dL (ref 0.50–0.99)
Calcium: 10 mg/dL (ref 8.6–10.4)
Chloride: 103 mmol/L (ref 98–110)
Glucose, Bld: 103 mg/dL — ABNORMAL HIGH (ref 65–99)
Potassium: 4.3 mmol/L (ref 3.5–5.3)
SODIUM: 141 mmol/L (ref 135–146)
TOTAL PROTEIN: 6.8 g/dL (ref 6.1–8.1)

## 2015-07-07 ENCOUNTER — Other Ambulatory Visit: Payer: Medicare Other

## 2015-07-07 ENCOUNTER — Encounter (INDEPENDENT_AMBULATORY_CARE_PROVIDER_SITE_OTHER): Payer: Self-pay

## 2015-07-08 ENCOUNTER — Telehealth: Payer: Self-pay | Admitting: Cardiovascular Disease

## 2015-07-08 NOTE — Telephone Encounter (Signed)
Called patient, no answer.  Left message for patient that lab work is normal and her lipid levels have improved.  I advised she only need to call back with questions or concerns.

## 2015-07-08 NOTE — Telephone Encounter (Signed)
Spoke with patient who states she is not currently taking Zetia due to cost.  She states she takes rosuvastatin and simvastatin.  I advised her to d/c simvastatin and continue rosuvastatin.  She states she will continue to check the price of Zetia and will start it when the price is lower.  I advised we will recheck at next office visit January 2018.  She verbalized understanding and agreement.

## 2015-07-08 NOTE — Telephone Encounter (Signed)
NEW MESSAGE   Pt is returning call for rn, she verbalized she had a missed call  Call for labs was placed, called POD D, no answer  Informed pt message would be sent to rn

## 2015-08-13 ENCOUNTER — Other Ambulatory Visit: Payer: Self-pay | Admitting: Internal Medicine

## 2015-08-13 DIAGNOSIS — M25512 Pain in left shoulder: Secondary | ICD-10-CM | POA: Diagnosis not present

## 2015-08-13 DIAGNOSIS — M533 Sacrococcygeal disorders, not elsewhere classified: Secondary | ICD-10-CM | POA: Diagnosis not present

## 2015-08-25 ENCOUNTER — Ambulatory Visit (INDEPENDENT_AMBULATORY_CARE_PROVIDER_SITE_OTHER): Payer: Medicare Other | Admitting: Internal Medicine

## 2015-08-25 ENCOUNTER — Other Ambulatory Visit (INDEPENDENT_AMBULATORY_CARE_PROVIDER_SITE_OTHER): Payer: Medicare Other

## 2015-08-25 ENCOUNTER — Encounter: Payer: Self-pay | Admitting: Internal Medicine

## 2015-08-25 VITALS — BP 118/86 | HR 84

## 2015-08-25 DIAGNOSIS — G5622 Lesion of ulnar nerve, left upper limb: Secondary | ICD-10-CM

## 2015-08-25 DIAGNOSIS — G5620 Lesion of ulnar nerve, unspecified upper limb: Secondary | ICD-10-CM | POA: Insufficient documentation

## 2015-08-25 DIAGNOSIS — E785 Hyperlipidemia, unspecified: Secondary | ICD-10-CM

## 2015-08-25 DIAGNOSIS — R945 Abnormal results of liver function studies: Secondary | ICD-10-CM

## 2015-08-25 DIAGNOSIS — R202 Paresthesia of skin: Secondary | ICD-10-CM

## 2015-08-25 DIAGNOSIS — G459 Transient cerebral ischemic attack, unspecified: Secondary | ICD-10-CM

## 2015-08-25 DIAGNOSIS — F418 Other specified anxiety disorders: Secondary | ICD-10-CM | POA: Diagnosis not present

## 2015-08-25 LAB — LIPID PANEL
CHOL/HDL RATIO: 3
CHOLESTEROL: 193 mg/dL (ref 0–200)
HDL: 76.3 mg/dL (ref >39.00)
LDL CALC: 98 mg/dL (ref 0–99)
NONHDL: 116.69
Triglycerides: 94 mg/dL (ref 0.0–149.0)
VLDL: 18.8 mg/dL (ref 0.0–40.0)

## 2015-08-25 LAB — HEPATIC FUNCTION PANEL
ALT: 27 U/L (ref 0–35)
AST: 24 U/L (ref 0–37)
Albumin: 4.8 g/dL (ref 3.5–5.2)
Alkaline Phosphatase: 94 U/L (ref 39–117)
BILIRUBIN DIRECT: 0.1 mg/dL (ref 0.0–0.3)
BILIRUBIN TOTAL: 0.4 mg/dL (ref 0.2–1.2)
TOTAL PROTEIN: 7.9 g/dL (ref 6.0–8.3)

## 2015-08-25 LAB — TSH: TSH: 1.59 u[IU]/mL (ref 0.35–4.50)

## 2015-08-25 LAB — BASIC METABOLIC PANEL
BUN: 37 mg/dL — ABNORMAL HIGH (ref 6–23)
CHLORIDE: 101 meq/L (ref 96–112)
CO2: 32 meq/L (ref 19–32)
Calcium: 10.9 mg/dL — ABNORMAL HIGH (ref 8.4–10.5)
Creatinine, Ser: 0.91 mg/dL (ref 0.40–1.20)
GFR: 65.34 mL/min (ref >60.00)
Glucose, Bld: 115 mg/dL — ABNORMAL HIGH (ref 70–99)
POTASSIUM: 4.5 meq/L (ref 3.5–5.1)
SODIUM: 140 meq/L (ref 135–145)

## 2015-08-25 LAB — VITAMIN B12: VITAMIN B 12: 476 pg/mL (ref 211–911)

## 2015-08-25 NOTE — Progress Notes (Signed)
Subjective:  Patient ID: Evelyn Murphy, female    DOB: [birth year]  Age: 70 y.o. MRN: [id number]  CC: No chief complaint on file.   HPI [name] presents for dyslipidemia, TIA, anxiety f/u C/o L hand numbness in the ulnar aspect off and on x weeks  Outpatient Prescriptions Prior to Visit  Medication Sig Dispense Refill  . aspirin EC 81 MG tablet Take 1 tablet (81 mg total) by mouth daily. 30 tablet 0  . Cholecalciferol (VITAMIN D3) 2000 UNITS capsule Take 1 capsule (2,000 Units total) by mouth daily. 100 capsule 3  . citalopram (CELEXA) 40 MG tablet TAKE ONE TABLET BY MOUTH ONE TIME DAILY 90 tablet 3  . ezetimibe (ZETIA) 10 MG tablet Take 1 tablet (10 mg total) by mouth daily. 30 tablet 11  . Fluticasone Furoate-Vilanterol (BREO ELLIPTA) 100-25 MCG/INH AEPB Inhale 1 puff into the lungs daily. 1 each 5  . lactulose (CHRONULAC) 10 GM/15ML solution Take 30-45 mLs (20-30 g total) by mouth 3 (three) times daily. 473 mL 11  . nortriptyline (PAMELOR) 75 MG capsule TAKE ONE CAPSULE BY MOUTH AT BEDTIME 90 capsule 1  . pantoprazole (PROTONIX) 40 MG tablet TAKE ONE TABLET BY MOUTH ONE TIME DAILY (Patient taking differently: TAKE ONE TABLET BY MOUTH AS NEEDED FOR HEARTBURN AND INDIGESTION) 90 tablet 3  . rosuvastatin (CRESTOR) 10 MG tablet Take 1 tablet (10 mg total) by mouth daily. 90 tablet 3  . zolpidem (AMBIEN) 10 MG tablet TAKE ONE TABLET BY MOUTH NIGHTLY AT BEDTIME 30 tablet 3   No facility-administered medications prior to visit.    ROS Review of Systems  Constitutional: Negative for chills, activity change, appetite change, fatigue and unexpected weight change.  HENT: Negative for congestion, mouth sores and sinus pressure.   Eyes: Negative for visual disturbance.  Respiratory: Negative for cough and chest tightness.   Gastrointestinal: Negative for nausea and abdominal pain.  Genitourinary: Negative for frequency, difficulty urinating and vaginal pain.  Musculoskeletal: Negative  for back pain and gait problem.  Skin: Negative for pallor and rash.  Neurological: Negative for dizziness, tremors, weakness, numbness and headaches.  Psychiatric/Behavioral: Positive for sleep disturbance. Negative for confusion. The patient is nervous/anxious.     Objective:  BP 118/86 mmHg  Pulse 84  Wt   SpO2 97%  BP Readings from Last 3 Encounters:  08/25/15 118/86  04/08/15 104/78  02/24/15 120/84    Wt Readings from Last 3 Encounters:  04/08/15 149 lb (67.586 kg)  02/24/15 145 lb (65.772 kg)  02/19/15 151 lb (68.493 kg)    Physical Exam  Constitutional: She appears well-developed. No distress.  HENT:  Head: Normocephalic.  Right Ear: External ear normal.  Left Ear: External ear normal.  Nose: Nose normal.  Mouth/Throat: Oropharynx is clear and moist.  Eyes: Conjunctivae are normal. Pupils are equal, round, and reactive to light. Right eye exhibits no discharge. Left eye exhibits no discharge.  Neck: Normal range of motion. Neck supple. No JVD present. No tracheal deviation present. No thyromegaly present.  Cardiovascular: Normal rate, regular rhythm and normal heart sounds.   Pulmonary/Chest: No stridor. No respiratory distress. She has no wheezes.  Abdominal: Soft. Bowel sounds are normal. She exhibits no distension and no mass. There is no tenderness. There is no rebound and no guarding.  Musculoskeletal: She exhibits no edema or tenderness.  Lymphadenopathy:    She has no cervical adenopathy.  Neurological: She displays normal reflexes. No cranial nerve deficit. She exhibits normal muscle  tone. Coordination normal.  Skin: No rash noted. No erythema.  Psychiatric: She has a normal mood and affect. Her behavior is normal. Judgment and thought content normal.    Lab Results  Component Value Date   WBC 5.6 04/03/2015   HGB 13.7 04/03/2015   HCT 41.7 04/03/2015   PLT 329.0 04/03/2015   GLUCOSE 103* 07/04/2015   CHOL 190 07/04/2015   TRIG 126 07/04/2015    HDL 81 07/04/2015   LDLDIRECT 135.5 01/24/2013   LDLCALC 84 07/04/2015   ALT 18 07/04/2015   AST 19 07/04/2015   NA 141 07/04/2015   K 4.3 07/04/2015   CL 103 07/04/2015   CREATININE 0.88 07/04/2015   BUN 22 07/04/2015   CO2 29 07/04/2015   TSH 2.50 04/03/2015   INR 0.96 06/29/2014   HGBA1C 6.2* 06/29/2014    US Thyroid Biopsy  04/03/2015  INDICATION: Indeterminate thyroid nodule EXAM: ULTRASOUND GUIDED FINE NEEDLE ASPIRATION OF INDETERMINATE THYROID NODULE COMPARISON:  Thyroid ultrasound - 02/25/2015; 09/18/2014 MEDICATIONS: None COMPLICATIONS: None immediate. TECHNIQUE: Informed written consent was obtained from the patient after a discussion of the risks, benefits and alternatives to treatment. Questions regarding the procedure were encouraged and answered. A timeout was performed prior to the initiation of the procedure. Pre-procedural ultrasound scanning demonstrated unchanged size and appearance of dominant approximately 1.4 cm complex nodule with the mid medial aspect of the left lobe of the thyroid. The procedure was planned. The neck was prepped in the usual sterile fashion, and a sterile drape was applied covering the operative field. A timeout was performed prior to the initiation of the procedure. Local anesthesia was provided with 1% lidocaine. Under direct ultrasound guidance, 4 FNA biopsies were performed of the dominant complex nodule with the mid medial aspect the left lobe of the thyroid with a 27 gauge needle. The samples were prepared and submitted to pathology. Limited post procedural scanning was negative for hematoma or additional complication. Dressings were placed. The patient tolerated the above procedures procedure well without immediate postprocedural complication. IMPRESSION: Technically successful ultrasound guided fine needle aspiration of dominant approximately 1.4 cm complex nodule with the mid/medial aspect the left lobe of the thyroid. Electronically Signed   By:  Sandi Mariscal M.D.   On: 04/03/2015 17:46    Assessment & Plan:   There are no diagnoses linked to this encounter. I am having [name] maintain her aspirin EC, Vitamin D3, fluticasone furoate-vilanterol, pantoprazole, rosuvastatin, lactulose, ezetimibe, citalopram, zolpidem, nortriptyline, and diclofenac.  Meds ordered this encounter  Medications  . diclofenac (VOLTAREN) 50 MG EC tablet    Sig: Take 1 tablet by mouth 2 (two) times daily as needed.    Refill:  0     Follow-up: No Follow-up on file.  Walker Kehr, MD

## 2015-08-25 NOTE — Assessment & Plan Note (Signed)
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

## 2015-08-25 NOTE — Assessment & Plan Note (Signed)
No relapse 

## 2015-08-25 NOTE — Assessment & Plan Note (Addendum)
Evelyn Murphy

## 2015-08-25 NOTE — Assessment & Plan Note (Signed)
On Pamelor 

## 2015-08-25 NOTE — Progress Notes (Signed)
Pre visit review using our clinic review tool, if applicable. No additional management support is needed unless otherwise documented below in the visit note. 

## 2015-09-01 ENCOUNTER — Ambulatory Visit (INDEPENDENT_AMBULATORY_CARE_PROVIDER_SITE_OTHER): Payer: Medicare Other | Admitting: Pulmonary Disease

## 2015-09-01 ENCOUNTER — Encounter: Payer: Self-pay | Admitting: Pulmonary Disease

## 2015-09-01 VITALS — BP 122/84 | HR 84 | Ht 64.0 in

## 2015-09-01 DIAGNOSIS — J4520 Mild intermittent asthma, uncomplicated: Secondary | ICD-10-CM

## 2015-09-01 MED ORDER — ALBUTEROL SULFATE HFA 108 (90 BASE) MCG/ACT IN AERS: 2.0000 | INHALATION_SPRAY | Freq: Four times a day (QID) | RESPIRATORY_TRACT | Status: DC | PRN

## 2015-09-01 NOTE — Patient Instructions (Signed)
Ventolin two puffs as needed for cough, wheeze, or chest congestion  Call if symptoms get worse

## 2015-09-01 NOTE — Progress Notes (Signed)
Current Outpatient Prescriptions on File Prior to Visit  Medication Sig  . aspirin EC 81 MG tablet Take 1 tablet (81 mg total) by mouth daily.  . Cholecalciferol (VITAMIN D3) 2000 UNITS capsule Take 1 capsule (2,000 Units total) by mouth daily.  . citalopram (CELEXA) 40 MG tablet TAKE ONE TABLET BY MOUTH ONE TIME DAILY  . diclofenac (VOLTAREN) 50 MG EC tablet Take 1 tablet by mouth 2 (two) times daily as needed.  . ezetimibe (ZETIA) 10 MG tablet Take 1 tablet (10 mg total) by mouth daily.  Marland Kitchen lactulose (CHRONULAC) 10 GM/15ML solution Take 30-45 mLs (20-30 g total) by mouth 3 (three) times daily.  . nortriptyline (PAMELOR) 75 MG capsule TAKE ONE CAPSULE BY MOUTH AT BEDTIME  . pantoprazole (PROTONIX) 40 MG tablet TAKE ONE TABLET BY MOUTH ONE TIME DAILY (Patient taking differently: TAKE ONE TABLET BY MOUTH AS NEEDED FOR HEARTBURN AND INDIGESTION)  . rosuvastatin (CRESTOR) 10 MG tablet Take 1 tablet (10 mg total) by mouth daily.  Marland Kitchen zolpidem (AMBIEN) 10 MG tablet TAKE ONE TABLET BY MOUTH NIGHTLY AT BEDTIME   No current facility-administered medications on file prior to visit.     Chief Complaint  Patient presents with  . Follow-up    Denies any current issue with cough. Good tolerance and control with Breo. No complaints.     Tests Echo 06/30/14 >> EF 55 to 123456, grade 1 diastolic dysfx PFT [id number] >> FEV1 1.88 (81%), FEV1% 86, TLC 4.29 (86%), DLCO 72%  Past medical hx HLD, Anxiety, Insomnia, RBBB  Past surgical hx, Allergies, Family hx, Social hx all reviewed.  Vital Signs BP 122/84 mmHg  Pulse 84  Ht 5\' 4"  (1.626 m)  Wt   SpO2 95%  History of Present Illness Evelyn Murphy is a 70 y.o. female with cough from asthma.  She has been doing well.  She has needed to use Breo a handful of times since November 2016.  She gets occasional cough and this goes away with [facility] >> usually brought on by weather change or wind.  She is not having wheeze, sputum, skin rash, sinus congestion.  She  doesn't have any trouble with cough while asleep.  Physical Exam  General - No distress ENT - No sinus tenderness, no oral exudate, no LAN Cardiac - s1s2 regular, no murmur Chest - No wheeze/rales/dullness Back - No focal tenderness Abd - Soft, non-tender Ext - No edema Neuro - Normal strength Skin - No rashes Psych - normal mood, and behavior   Assessment/Plan  Mild, intermittent asthma. - will send script for ventolin - she is to call for follow up if her symptoms get worse >> otherwise can f/u with her PCP   Patient Instructions  Ventolin two puffs as needed for cough, wheeze, or chest congestion  Call if symptoms get worse     Chesley Mires, MD [facility] Pager:  [phone] 09/01/2015, 11:17 AM

## 2015-09-18 DIAGNOSIS — R229 Localized swelling, mass and lump, unspecified: Secondary | ICD-10-CM | POA: Diagnosis not present

## 2015-09-18 DIAGNOSIS — Z124 Encounter for screening for malignant neoplasm of cervix: Secondary | ICD-10-CM | POA: Diagnosis not present

## 2015-09-18 DIAGNOSIS — Z1231 Encounter for screening mammogram for malignant neoplasm of breast: Secondary | ICD-10-CM | POA: Diagnosis not present

## 2015-09-18 DIAGNOSIS — Z6826 Body mass index (BMI) 26.0-26.9, adult: Secondary | ICD-10-CM | POA: Diagnosis not present

## 2015-10-16 DIAGNOSIS — H6123 Impacted cerumen, bilateral: Secondary | ICD-10-CM | POA: Diagnosis not present

## 2015-11-10 ENCOUNTER — Other Ambulatory Visit: Payer: Self-pay | Admitting: Internal Medicine

## 2015-11-10 DIAGNOSIS — Z23 Encounter for immunization: Secondary | ICD-10-CM | POA: Diagnosis not present

## 2015-11-12 NOTE — Telephone Encounter (Signed)
Called refill into pharmacy left on pharmacy vm.../lmb 

## 2015-11-26 ENCOUNTER — Other Ambulatory Visit: Payer: Self-pay | Admitting: Cardiovascular Disease

## 2015-12-06 DIAGNOSIS — N3090 Cystitis, unspecified without hematuria: Secondary | ICD-10-CM | POA: Diagnosis not present

## 2015-12-06 DIAGNOSIS — R319 Hematuria, unspecified: Secondary | ICD-10-CM | POA: Diagnosis not present

## 2015-12-12 DIAGNOSIS — L03012 Cellulitis of left finger: Secondary | ICD-10-CM | POA: Diagnosis not present

## 2015-12-12 DIAGNOSIS — R319 Hematuria, unspecified: Secondary | ICD-10-CM | POA: Diagnosis not present

## 2015-12-17 ENCOUNTER — Telehealth: Payer: Self-pay | Admitting: Internal Medicine

## 2015-12-17 ENCOUNTER — Ambulatory Visit (INDEPENDENT_AMBULATORY_CARE_PROVIDER_SITE_OTHER): Payer: Medicare Other | Admitting: Pulmonary Disease

## 2015-12-17 ENCOUNTER — Encounter: Payer: Self-pay | Admitting: Pulmonary Disease

## 2015-12-17 VITALS — BP 104/70 | HR 41 | Ht 63.0 in | Wt 140.0 lb

## 2015-12-17 DIAGNOSIS — K5909 Other constipation: Secondary | ICD-10-CM

## 2015-12-17 DIAGNOSIS — R059 Cough, unspecified: Secondary | ICD-10-CM

## 2015-12-17 DIAGNOSIS — J4531 Mild persistent asthma with (acute) exacerbation: Secondary | ICD-10-CM

## 2015-12-17 DIAGNOSIS — R05 Cough: Secondary | ICD-10-CM | POA: Diagnosis not present

## 2015-12-17 DIAGNOSIS — J4520 Mild intermittent asthma, uncomplicated: Secondary | ICD-10-CM | POA: Diagnosis not present

## 2015-12-17 LAB — NITRIC OXIDE: NITRIC OXIDE: 75

## 2015-12-17 MED ORDER — PREDNISONE 10 MG PO TABS: ORAL_TABLET | ORAL | 0 refills | Status: DC

## 2015-12-17 MED ORDER — FLUTICASONE FUROATE-VILANTEROL 200-25 MCG/INH IN AEPB: 1.0000 | INHALATION_SPRAY | Freq: Every day | RESPIRATORY_TRACT | 6 refills | Status: DC

## 2015-12-17 NOTE — Progress Notes (Signed)
Current Outpatient Prescriptions on File Prior to Visit  Medication Sig  . albuterol (VENTOLIN HFA) 108 (90 Base) MCG/ACT inhaler Inhale 2 puffs into the lungs every 6 (six) hours as needed for wheezing or shortness of breath.  Marland Kitchen aspirin EC 81 MG tablet Take 1 tablet (81 mg total) by mouth daily.  . Cholecalciferol (VITAMIN D3) 2000 UNITS capsule Take 1 capsule (2,000 Units total) by mouth daily.  . citalopram (CELEXA) 40 MG tablet TAKE ONE TABLET BY MOUTH ONE TIME DAILY  . ezetimibe (ZETIA) 10 MG tablet Take 1 tablet (10 mg total) by mouth daily.  Marland Kitchen lactulose (CHRONULAC) 10 GM/15ML solution Take 30-45 mLs (20-30 g total) by mouth 3 (three) times daily.  . nortriptyline (PAMELOR) 75 MG capsule TAKE ONE CAPSULE BY MOUTH AT BEDTIME  . pantoprazole (PROTONIX) 40 MG tablet TAKE ONE TABLET BY MOUTH ONE TIME DAILY (Patient taking differently: TAKE ONE TABLET BY MOUTH AS NEEDED FOR HEARTBURN AND INDIGESTION)  . rosuvastatin (CRESTOR) 10 MG tablet TAKE 1 TABLET BY MOUTH DAILY.  Marland Kitchen zolpidem (AMBIEN) 10 MG tablet TAKE 1 TABLET BY MOUTH NIGHTLY AT BETIME  . diclofenac (VOLTAREN) 50 MG EC tablet Take 1 tablet by mouth 2 (two) times daily as needed.   No current facility-administered medications on file prior to visit.      Chief Complaint  Patient presents with  . Acute Visit    Pt. c/o of wheezing over the past week and a half, with a productive cough, some sob    Pulmonary tests PFT 02/19/15 >> FEV1 1.88 (81%), FEV1% 86, TLC 4.29 (86%), DLCO 72% FeNO 12/17/15 >> 75  Cardiac tests Echo 06/30/14 >> EF 55 to 123456, grade 1 diastolic dysfx  Past medical history HLD, Anxiety, Insomnia, RBBB  Past surgical history, Family history, Social history, Allergies reviewed.  Vital Signs BP 104/70 (BP Location: Left Arm, Cuff Size: Normal)   Pulse (!) 41   Ht 5\' 3"  (1.6 m)   Wt 140 lb (63.5 kg)   SpO2 94%   BMI 24.80 kg/m   History of Present Illness Evelyn Murphy is a 70 y.o. female with cough from  asthma.  She was doing well until 1.5 weeks ago.  Since then she has wheeze, chest tightness and cough with chest congestion.  She is not really bringing up sputum, but feels a rattle in her chest.  She has been using albuterol more >> this helps, but doesn't clear symptoms completely.  She denies sick exposures, environmental exposures, travel.  She denies fever, headache, ear pain, sinus congestion, sore throat, abdominal pain, nausea, diarrhea, skin rash, or leg swelling.  Physical Exam  General - No distress ENT - No sinus tenderness, no oral exudate, no LAN Cardiac - s1s2 regular, no murmur Chest - b/l expiratory wheeze and squeaks Back - No focal tenderness Abd - Soft, non-tender Ext - No edema Neuro - Normal strength Skin - No rashes Psych - normal mood, and behavior   Assessment/Plan  Acute asthmatic bronchitis. - will give course of prednisone - will have her use sample of breo - continue prn albuterol   Patient Instructions  Breo 200 one puff daily  Prednisone 10 mg pills >> 3 pills daily for 2 days, 2 pills daily for 2 days, 1 pill daily for 2 days  Follow up in 4 weeks with Dr. Halford Chessman or Nurse Practitioner    Chesley Mires, MD [facility]/Sleep Pager:  [phone] 12/17/2015, 9:52 AM

## 2015-12-17 NOTE — Telephone Encounter (Signed)
Patient walked in today to ask for a referral to gastro- she states that she has terrible constipation and that nothing helps. She stated that she has not seen you for this yet

## 2015-12-17 NOTE — Patient Instructions (Addendum)
Breo 200 one puff daily  Prednisone 10 mg pills >> 3 pills daily for 2 days, 2 pills daily for 2 days, 1 pill daily for 2 days  Follow up in 4 weeks with Dr. Halford Chessman or Nurse Practitioner

## 2015-12-19 NOTE — Telephone Encounter (Signed)
OK ref Thx 

## 2015-12-31 ENCOUNTER — Ambulatory Visit: Payer: Medicare Other | Admitting: Gastroenterology

## 2016-01-12 ENCOUNTER — Ambulatory Visit: Payer: Medicare Other | Admitting: Gastroenterology

## 2016-01-14 ENCOUNTER — Encounter: Payer: Self-pay | Admitting: Adult Health

## 2016-01-14 ENCOUNTER — Ambulatory Visit (INDEPENDENT_AMBULATORY_CARE_PROVIDER_SITE_OTHER): Payer: Medicare Other | Admitting: Adult Health

## 2016-01-14 DIAGNOSIS — J4530 Mild persistent asthma, uncomplicated: Secondary | ICD-10-CM | POA: Diagnosis not present

## 2016-01-14 NOTE — Progress Notes (Signed)
Subjective:    Patient ID: Evelyn Murphy, female    DOB: [birth year], 70 y.o.   MRN: [id number]  HPI 70 yo female followed for asthma and cough  Initial pulmonary consult  01/16/15 with Dr. Halford Chessman  .   TESTS: Echo 06/30/14 >> EF 55 to 123456, grade 1 diastolic dysfx PFT [id number] >> FEV1 1.88 (81%), FEV1% 86, TLC 4.29 (86%), DLCO 72% FeNO 12/17/15 >> 75   01/14/2016 Follow up :Asthma  Pt returns for a one-month follow-up. Pt had asthma flare last ov , given steroid treatment and started on BREO . She says she is much better. She used BREO a few times but did not think she needs this. We discussed this a controlled /maintenence medication. She declines daily use and does not want a refill.  Says cough and wheezing have totally resolved and breathing is back to baseline . Denies dyspnea.  She denies any chest pain, orthopnea, PND, leg swelling, hemoptysis or fever. No ventolin use.   Past Medical History:  Diagnosis Date  . Anxiety   . Hyperlipidemia   . Insomnia   . RBBB    Current Outpatient Prescriptions on File Prior to Visit  Medication Sig Dispense Refill  . albuterol (VENTOLIN HFA) 108 (90 Base) MCG/ACT inhaler Inhale 2 puffs into the lungs every 6 (six) hours as needed for wheezing or shortness of breath. 1 Inhaler 5  . aspirin EC 81 MG tablet Take 1 tablet (81 mg total) by mouth daily. 30 tablet 0  . Cholecalciferol (VITAMIN D3) 2000 UNITS capsule Take 1 capsule (2,000 Units total) by mouth daily. 100 capsule 3  . citalopram (CELEXA) 40 MG tablet TAKE ONE TABLET BY MOUTH ONE TIME DAILY 90 tablet 3  . ezetimibe (ZETIA) 10 MG tablet Take 1 tablet (10 mg total) by mouth daily. 30 tablet 11  . fluticasone furoate-vilanterol (BREO ELLIPTA) 200-25 MCG/INH AEPB Inhale 1 puff into the lungs daily. 60 each 6  . nortriptyline (PAMELOR) 75 MG capsule TAKE ONE CAPSULE BY MOUTH AT BEDTIME 90 capsule 1  . pantoprazole (PROTONIX) 40 MG tablet TAKE ONE TABLET BY MOUTH ONE TIME DAILY (Patient  taking differently: TAKE ONE TABLET BY MOUTH AS NEEDED FOR HEARTBURN AND INDIGESTION) 90 tablet 3  . rosuvastatin (CRESTOR) 10 MG tablet TAKE 1 TABLET BY MOUTH DAILY. 90 tablet 1  . zolpidem (AMBIEN) 10 MG tablet TAKE 1 TABLET BY MOUTH NIGHTLY AT BETIME 30 tablet 5  . diclofenac (VOLTAREN) 50 MG EC tablet Take 1 tablet by mouth 2 (two) times daily as needed.  0  . lactulose (CHRONULAC) 10 GM/15ML solution Take 30-45 mLs (20-30 g total) by mouth 3 (three) times daily. (Patient not taking: Reported on 01/14/2016) 473 mL 11   No current facility-administered medications on file prior to visit.        Review of Systems Constitutional:   No  weight loss, night sweats,  Fevers, chills, fatigue, or  lassitude.  HEENT:   No headaches,  Difficulty swallowing,  Tooth/dental problems, or  Sore throat,                No sneezing, itching, ear ache, nasal congestion, post nasal drip,   CV:  No chest pain,  Orthopnea, PND, swelling in lower extremities, anasarca, dizziness, palpitations, syncope.   GI  No heartburn, indigestion, abdominal pain, nausea, vomiting, diarrhea, change in bowel habits, loss of appetite, bloody stools.   Resp: No shortness of breath with exertion or at rest.  No  excess mucus, no productive cough,  No non-productive cough,  No coughing up of blood.  No change in color of mucus.  No wheezing.  No chest wall deformity  Skin: no rash or lesions.  GU: no dysuria, change in color of urine, no urgency or frequency.  No flank pain, no hematuria   MS:  No joint pain or swelling.  No decreased range of motion.  No back pain.  Psych:  No change in mood or affect. No depression or anxiety.  No memory loss.         Objective:   Physical Exam   Vitals:   01/14/16 1043  BP: 128/60  Pulse: 81  SpO2: 94%  Height: 5\' 3"  (1.6 m)    GEN: A/Ox3; pleasant , NAD    HEENT:  [place]/AT,  EACs-clear, TMs-wnl, NOSE-clear, THROAT-clear, no lesions, no postnasal drip or exudate noted.    NECK:  Supple w/ fair ROM; no JVD; normal carotid impulses w/o bruits; no thyromegaly or nodules palpated; no lymphadenopathy.    RESP  Clear  P & A; w/o, wheezes/ rales/ or rhonchi. no accessory muscle use, no dullness to percussion  CARD:  RRR, no m/r/g  , no peripheral edema, pulses intact, no cyanosis or clubbing.  GI:   Soft & nt; nml bowel sounds; no organomegaly or masses detected.   Musco: Warm bil, no deformities or joint swelling noted.   Neuro: alert, no focal deficits noted.    Skin: Warm, no lesions or rashes  . Blakelynn Scheeler NP-C  [facility]  01/14/2016

## 2016-01-14 NOTE — Patient Instructions (Addendum)
Continue on current regimen .  Follow up Dr. Halford Chessman  As needed

## 2016-01-14 NOTE — Progress Notes (Signed)
Reviewed and agree with assessment/plan.  Chesley Mires, MD [facility] 01/14/2016, 1:36 PM Pager:  [phone]

## 2016-01-14 NOTE — Assessment & Plan Note (Signed)
Recent flare requiring steroids. She declines daily use of BREO . She has ventolin to use As needed   We discussed follow up . She would like to follow up with PCP only .  Advised to call our office for follow up As needed   Suspect she will need maintenance med as FeNO was elevated at last ov and if she continues to have frequent asthma flares  Plan  Patient Instructions  Continue on current regimen .  Follow up Dr. Halford Chessman  As needed

## 2016-01-27 DIAGNOSIS — H2513 Age-related nuclear cataract, bilateral: Secondary | ICD-10-CM | POA: Diagnosis not present

## 2016-01-28 ENCOUNTER — Ambulatory Visit (INDEPENDENT_AMBULATORY_CARE_PROVIDER_SITE_OTHER): Payer: Medicare Other | Admitting: Gastroenterology

## 2016-01-28 ENCOUNTER — Encounter: Payer: Self-pay | Admitting: Gastroenterology

## 2016-01-28 ENCOUNTER — Telehealth: Payer: Self-pay | Admitting: Cardiovascular Disease

## 2016-01-28 VITALS — BP 100/70 | HR 96 | Ht 63.0 in

## 2016-01-28 DIAGNOSIS — E782 Mixed hyperlipidemia: Secondary | ICD-10-CM

## 2016-01-28 DIAGNOSIS — K5909 Other constipation: Secondary | ICD-10-CM | POA: Diagnosis not present

## 2016-01-28 DIAGNOSIS — H532 Diplopia: Secondary | ICD-10-CM

## 2016-01-28 MED ORDER — LINACLOTIDE 72 MCG PO CAPS: 72.0000 ug | ORAL_CAPSULE | Freq: Every day | ORAL | 0 refills | Status: DC

## 2016-01-28 NOTE — Progress Notes (Signed)
     01/28/2016 SANIAA HOSCHAR [id number] [birth year]   History of Present Illness:  This is a 70 year old female with long-standing constipation.  Has tried Linzess 290 mcg but says that it was way too much for her.  Lactulose does not work.  It appears that she was on Amitiza 24 mcg BID at one point a couple of years ago but she does not recall what occurred with that medication.  Uses an OTC laxative when she has not had a BM in a while but then has incontinence with that at times.  Last colonoscopy was 11/2009 with Dr. Carlean Purl at which time the study was normal with repeat recommended in 10 years from that time.  Denies any other complaints including rectal bleeding and abdominal pain, etc.  Recent TSH WNL's.  Current Medications, Allergies, Past Medical History, Past Surgical History, Family History and Social History were reviewed in [employer] record.   Physical Exam: BP 100/70   Pulse 96   Ht 5\' 3"  (1.6 m)  General: Well developed white female in no acute distress Head: Normocephalic and atraumatic Eyes:  Sclerae anicteric, conjunctiva pink  Ears: Normal auditory acuity Lungs: Clear throughout to auscultation Heart: Regular rate and rhythm Abdomen: Soft, non-distended.  Normal bowel sounds.  Non-tender. Musculoskeletal: Symmetrical with no gross deformities  Extremities: No edema  Neurological: Alert oriented x 4, grossly non-focal Psychological:  Alert and cooperative. Normal mood and affect  Assessment and Recommendations: -Long-standing constipation:  Will try Linzess 72 mcg daily.  Samples given and she will call back with an update on her symptoms.  If it is working well then we can send a prescription.

## 2016-01-28 NOTE — Telephone Encounter (Signed)
Informed Evelyn Murphy that faxed was received. Will place in [name]'s "mailbox" for he and his nurse to address.

## 2016-01-28 NOTE — Telephone Encounter (Signed)
New message      Calling to see if you received a fax from [facility] this am.  Please call

## 2016-01-28 NOTE — Telephone Encounter (Signed)
Informed that we have not received any fax.  Gave different number to send to [phone]).

## 2016-01-28 NOTE — Patient Instructions (Signed)
You have been given some samples of Linzess 72 mcg.  Take this for a week or 10 days and then call back with an update.  If these work for you, we will call in a prescription for them.

## 2016-01-29 ENCOUNTER — Encounter: Payer: Self-pay | Admitting: Internal Medicine

## 2016-01-30 ENCOUNTER — Telehealth: Payer: Self-pay | Admitting: Cardiovascular Disease

## 2016-01-30 ENCOUNTER — Other Ambulatory Visit: Payer: Medicare Other | Admitting: *Deleted

## 2016-01-30 DIAGNOSIS — E782 Mixed hyperlipidemia: Secondary | ICD-10-CM

## 2016-01-30 LAB — COMPREHENSIVE METABOLIC PANEL
ALT: 43 U/L — ABNORMAL HIGH (ref 6–29)
AST: 36 U/L — ABNORMAL HIGH (ref 10–35)
Albumin: 4.3 g/dL (ref 3.6–5.1)
Alkaline Phosphatase: 93 U/L (ref 33–130)
BUN: 23 mg/dL (ref 7–25)
CHLORIDE: 102 mmol/L (ref 98–110)
CO2: 28 mmol/L (ref 20–31)
CREATININE: 0.82 mg/dL (ref 0.50–0.99)
Calcium: 10.2 mg/dL (ref 8.6–10.4)
GLUCOSE: 91 mg/dL (ref 65–99)
Potassium: 4.6 mmol/L (ref 3.5–5.3)
SODIUM: 140 mmol/L (ref 135–146)
Total Bilirubin: 0.5 mg/dL (ref 0.2–1.2)
Total Protein: 7.2 g/dL (ref 6.1–8.1)

## 2016-01-30 LAB — LIPID PANEL
CHOL/HDL RATIO: 2.3 ratio (ref <5.0)
Cholesterol: 183 mg/dL (ref <200)
HDL: 80 mg/dL (ref >50)
LDL CALC: 79 mg/dL (ref <100)
Triglycerides: 120 mg/dL (ref <150)
VLDL: 24 mg/dL (ref <30)

## 2016-01-30 NOTE — Telephone Encounter (Signed)
I have moved her appointment to today

## 2016-01-30 NOTE — Telephone Encounter (Signed)
Spoke with patient and advised her that Dr. Acie Fredrickson wants to order a carotid duplex and fasting cmet/lipid.  She scheduled lab appointment for Monday 11/13 and I advised that the vascular scheduler will call her to schedule her carotid study.  I advised her that test will be performed at our [facility] office. She is scheduled to see Dr. Acie Fredrickson in January.  I advised her to call back with questions or concerns.  She thanked me for the call. Christine @ Dr. Bary Leriche office is aware of the tests that have been ordered and thanked me for the call.

## 2016-01-30 NOTE — Progress Notes (Signed)
Agree with Ms. Zehr's management.  Lonya Johannesen E. Ogden Handlin, MD, FACG  

## 2016-01-30 NOTE — Telephone Encounter (Signed)
Follow Up:    Pt says she will be in today for lab work instead of Monday.

## 2016-02-02 ENCOUNTER — Other Ambulatory Visit: Payer: Medicare Other

## 2016-02-04 ENCOUNTER — Other Ambulatory Visit: Payer: Self-pay | Admitting: Internal Medicine

## 2016-02-11 ENCOUNTER — Ambulatory Visit (INDEPENDENT_AMBULATORY_CARE_PROVIDER_SITE_OTHER): Payer: Medicare Other | Admitting: Orthopedic Surgery

## 2016-02-17 ENCOUNTER — Ambulatory Visit (HOSPITAL_COMMUNITY)
Admission: RE | Admit: 2016-02-17 | Discharge: 2016-02-17 | Disposition: A | Discharge status: Home / Self Care | Payer: Medicare Other | Source: Ambulatory Visit | Attending: Cardiovascular Disease | Admitting: Cardiovascular Disease

## 2016-02-17 DIAGNOSIS — I6523 Occlusion and stenosis of bilateral carotid arteries: Secondary | ICD-10-CM | POA: Diagnosis not present

## 2016-02-17 DIAGNOSIS — R51 Headache: Secondary | ICD-10-CM | POA: Diagnosis not present

## 2016-02-17 DIAGNOSIS — H532 Diplopia: Secondary | ICD-10-CM | POA: Insufficient documentation

## 2016-02-17 DIAGNOSIS — E785 Hyperlipidemia, unspecified: Secondary | ICD-10-CM | POA: Insufficient documentation

## 2016-02-18 ENCOUNTER — Encounter (INDEPENDENT_AMBULATORY_CARE_PROVIDER_SITE_OTHER): Payer: Self-pay | Admitting: Orthopedic Surgery

## 2016-02-18 ENCOUNTER — Ambulatory Visit (INDEPENDENT_AMBULATORY_CARE_PROVIDER_SITE_OTHER): Payer: Medicare Other | Admitting: Orthopedic Surgery

## 2016-02-18 ENCOUNTER — Ambulatory Visit (INDEPENDENT_AMBULATORY_CARE_PROVIDER_SITE_OTHER): Payer: Medicare Other

## 2016-02-18 ENCOUNTER — Telehealth: Payer: Self-pay | Admitting: Cardiovascular Disease

## 2016-02-18 ENCOUNTER — Telehealth (INDEPENDENT_AMBULATORY_CARE_PROVIDER_SITE_OTHER): Payer: Self-pay | Admitting: Orthopaedic Surgery

## 2016-02-18 VITALS — BP 104/74 | HR 90 | Resp 14 | Ht 63.0 in | Wt 145.0 lb

## 2016-02-18 DIAGNOSIS — M7552 Bursitis of left shoulder: Secondary | ICD-10-CM

## 2016-02-18 DIAGNOSIS — M25512 Pain in left shoulder: Secondary | ICD-10-CM

## 2016-02-18 MED ORDER — LIDOCAINE HCL 1 % IJ SOLN
5.0000 mL | INTRAMUSCULAR | Status: AC | PRN
  Administered 2016-02-18: 5 mL

## 2016-02-18 MED ORDER — METHYLPREDNISOLONE ACETATE 40 MG/ML IJ SUSP
40.0000 mg | INTRAMUSCULAR | Status: AC | PRN
  Administered 2016-02-18: 40 mg via INTRA_ARTICULAR

## 2016-02-18 NOTE — Telephone Encounter (Signed)
Pt calling back for results of Carotid US done 02-17-16-pls call

## 2016-02-18 NOTE — Progress Notes (Signed)
Office Visit Note   Patient: Evelyn Murphy           Date of Birth: 06-Jul-1945           MRN: [id number] Visit Date: 02/18/2016              Requested by: Cassandria Anger, MD [place], [place] [address] PCP: Walker Kehr, MD   Assessment & Plan: Visit Diagnoses:  1. Bursitis of left shoulder   2. Left anterior shoulder pain     Plan: At this time plan is to do a repeat subacromial injection to the left shoulder and that was accomplished atraumatically  If this does not improve over the next several days to week then she will give [place] a call and we'll schedule an MRI scan of the left shoulder.  We will need to also include scapula and clavicle.  Follow-Up Instructions: No Follow-up on file.   Orders:  Orders Placed This Encounter  Procedures  . Large Joint Injection/Arthrocentesis  . XR Shoulder Left   No orders of the defined types were placed in this encounter.     Procedures: Large Joint Inj Date/Time: 02/18/2016 10:57 AM Performed by: Biagio Borg D Authorized by: Biagio Borg D   Consent Given by:  Patient Timeout: prior to procedure the correct patient, procedure, and site was verified   Indications:  Pain Location:  Shoulder Site:  L subacromial bursa Prep: patient was prepped and draped in usual sterile fashion   Needle Size:  25 G Needle Length:  1.5 inches Approach:  Lateral Ultrasound Guidance: No   Fluoroscopic Guidance: No   Arthrogram: No   Medications:  5 mL lidocaine 1 %; 40 mg methylPREDNISolone acetate 40 MG/ML Aspiration Attempted: No   Patient tolerance:  Patient tolerated the procedure well with no immediate complications     Clinical Data: No additional findings.   Subjective: Chief Complaint  Patient presents with  . Left Shoulder - Pain  . Right Hip - Pain    IN today for evaluation of her left shoulder as well as her right pelvis. I had seen her previously in May 2017 with a three-week history of  pain discomfort mainly in the triceps area which at nighttime would wake her up. At that time she used ibuprofen for pain. She has had a previous injection for a trigger point prior to that visit. She did have a cortisone injection on 08/13/2015 subacromially and she had marked improvement. She has done well until recently when she started developing pain in the left shoulder over the last 2 weeks. Denies any history of injury or trauma. It is mainly anterior now. She states she has full range of motion but it just hurts in the anterior portion of her shoulder. Denies any neurovascular compromise.   She also states that she has had Right hip pain for 1 month off/on which is improving. She again points more to the SI joint. She has very similar symptoms also back on 08/13/2015. However she states this is improving as not concerned about this at this time.    Review of Systems  Constitutional: Negative.   HENT: Negative.   Respiratory: Negative.   Cardiovascular: Negative.   Gastrointestinal: Negative.   Genitourinary: Negative.   Skin: Negative.   Neurological: Negative.   Hematological: Negative.   Psychiatric/Behavioral: Negative.      Objective: Vital Signs: BP 104/74 (BP Location: Left Arm, Patient Position: Sitting, Cuff Size: Large)  Pulse 90   Resp 14   Ht 5\' 3"  (1.6 m)   Wt 145 lb (65.8 kg)   BMI 25.69 kg/m   Physical Exam  Constitutional: She is oriented to person, place, and time. She appears well-developed and well-nourished.  HENT:  Head: Normocephalic and atraumatic.  Eyes: EOM are normal. Pupils are equal, round, and reactive to light.  Neck:  No carotid bruits  Cardiovascular: Normal rate.   Pulmonary/Chest: Effort normal.  Neurological: She is alert and oriented to person, place, and time.  Skin: Skin is warm and dry.  Psychiatric: She has a normal mood and affect. Her behavior is normal. Judgment and thought content normal.    Left Shoulder Exam   Range  of Motion  Active Abduction: 160  Passive Abduction: 160  Forward Flexion: 160  Left shoulder external rotation: 85.  Internal Rotation 90 degrees: 70   Muscle Strength  Abduction: 3/5  Internal Rotation: 4/5  Supraspinatus: 3/5  Subscapularis: 4/5  Biceps: 3/5   Tests  Impingement: positive (mild)  Other  Sensation: normal Pulse: present   Comments:  Some tenderness over the biceps proximally. No before meals joint pain. She seems globally weak against resistance.      Specialty Comments:  No specialty comments available.  Imaging: Xr Shoulder Left  Result Date: 02/18/2016 5 view x-ray of the left shoulder reveals inferior humeral neck, spur. Joint space is maintained. Appears to have a type 2 acromion. On the Y view there appears to be a possible cortical disruption overlying the scapular area. This midportion of the blade. The axillary lateral show a possible fracture which may be in the clavicular area. The x-ray was specifically by Dr. Durward Fortes felt that he could be a remote fracture if there was old history or variant of normal.    PMFS History: Patient Active Problem List   Diagnosis Date Noted  . Ulnar neuropathy 08/25/2015  . Paresthesia of hand 08/25/2015  . Dyslipidemia 02/24/2015  . Cough 11/27/2014  . Phalanx, proximal fracture of finger 10/15/2014  . Anemia 10/03/2014  . Finger sprain 08/12/2014  . Quadriceps tendinitis 07/22/2014  . Constipation 07/04/2014  . Altered mental status   . Slurred speech 06/29/2014  . Unsteady gait 06/29/2014  . Confusion 06/29/2014  . TIA (transient ischemic attack) 06/29/2014  . Urinary frequency 01/31/2014  . Diastolic dysfunction [id number]  . Chest pain 11/22/2013  . Left shoulder pain 11/22/2013  . Unspecified constipation 07/31/2013  . GERD (gastroesophageal reflux disease) 01/24/2013  . Asthma 07/22/2012  . Nonspecific abnormal electrocardiogram (ECG) (EKG) 07/10/2012  . Well adult exam 07/10/2012  .  Abnormal CBC 01/11/2012  . Cerumen impaction 07/06/2011  . Shoulder pain, right 07/06/2011  . DYSPHAGIA UNSPECIFIED 03/04/2010  . PELVIC PAIN, LEFT 07/18/2008  . RENAL CYST 05/14/2008  . ABNORMAL LIVER FUNCTION TESTS 04/30/2008  . Hyperlipidemia 02/06/2007  . INSOMNIA, PERSISTENT 02/06/2007  . Depression with anxiety 12/02/2006   Past Medical History:  Diagnosis Date  . Anxiety   . Hyperlipidemia   . Insomnia   . RBBB     Family History  Problem Relation Age of Onset  . Heart disease Mother 70    CABG  . Diabetes Mother   . Macular degeneration Mother   . Heart disease Father     Father died of MI at 20  . COPD Father   . Heart disease Brother   . Diabetes Brother   . Macular degeneration Brother     Past Surgical  History:  Procedure Laterality Date  . COLONOSCOPY    . SHOULDER ARTHROSCOPY  10/2011   R Dr Meyer Cory   Social History   Occupational History  . Retired    Social History Main Topics  . Smoking status: Never Smoker  . Smokeless tobacco: Never Used  . Alcohol use No  . Drug use: No  . Sexual activity: Not on file

## 2016-02-18 NOTE — Telephone Encounter (Signed)
Patient was seen this morning by Aaron Edelman and would like the Voltaren rx sent to [facility] on [address]. Thank you

## 2016-02-18 NOTE — Telephone Encounter (Signed)
Please see Carotid US note

## 2016-02-19 MED ORDER — VOLTAREN 1 % TD GEL: 2.0000 g | Freq: Four times a day (QID) | TRANSDERMAL | 1 refills | Status: DC

## 2016-02-19 NOTE — Addendum Note (Signed)
Addended by: Mervyn Skeeters on: 02/19/2016 02:50 PM   Modules accepted: Orders

## 2016-02-19 NOTE — Telephone Encounter (Signed)
Ok to send rx for the gel

## 2016-02-19 NOTE — Telephone Encounter (Signed)
Done

## 2016-02-19 NOTE — Telephone Encounter (Signed)
Please advise. Did not see a note in her chart for this as an option.

## 2016-02-25 ENCOUNTER — Ambulatory Visit: Payer: Medicare Other | Admitting: Internal Medicine

## 2016-03-01 ENCOUNTER — Telehealth: Payer: Self-pay | Admitting: Gastroenterology

## 2016-03-01 MED ORDER — LINACLOTIDE 145 MCG PO CAPS: 145.0000 ug | ORAL_CAPSULE | Freq: Every day | ORAL | 6 refills | Status: DC

## 2016-03-01 NOTE — Telephone Encounter (Signed)
Pt states the 72 mcg linzess is not working she says that she has 0-1 Bm per week.  Wants to try the 145 mcg linzess. Can we send?

## 2016-03-01 NOTE — Telephone Encounter (Signed)
Pt prescription has been sent to the pharmacy. She will call back with any concerns

## 2016-03-01 NOTE — Telephone Encounter (Signed)
Yes, please send 145 mcg. Thanks-JLL

## 2016-03-03 ENCOUNTER — Other Ambulatory Visit: Payer: Self-pay | Admitting: Internal Medicine

## 2016-03-03 ENCOUNTER — Encounter: Payer: Self-pay | Admitting: Cardiovascular Disease

## 2016-03-03 ENCOUNTER — Ambulatory Visit (INDEPENDENT_AMBULATORY_CARE_PROVIDER_SITE_OTHER): Payer: Medicare Other | Admitting: Cardiovascular Disease

## 2016-03-03 VITALS — BP 110/78 | HR 80 | Resp 16 | Ht 63.0 in | Wt 145.0 lb

## 2016-03-03 DIAGNOSIS — E782 Mixed hyperlipidemia: Secondary | ICD-10-CM

## 2016-03-03 DIAGNOSIS — G459 Transient cerebral ischemic attack, unspecified: Secondary | ICD-10-CM

## 2016-03-03 NOTE — Progress Notes (Signed)
Evelyn Murphy Date of Birth  01-Feb-1946       [facility]    [employer] [address], [address], [place], [place]  [address]   [place], [place]  [address] [phone]     [phone]   Fax  [phone]    Fax [phone]  Problem List: 1. Strong family history of coronary artery disease 2. Hyperlipidemia  History of Present Illness:  Evelyn Murphy is a 70 yo with family hx of CAD ( father died at 9 of MI, mother had MI at 90, CABG x 2, brother has had multiple stents).  Both aunts diet of heart disease.  She is concerned about her risks.   She denies any CP or dyspnea.  She exercises every day - 45 minutes on treadmill, 40 minutes on stationary bike,.  She also adds weights 3-4 days a week.    She has eats a low fat diet,   Dec. 30, 2015:  Evelyn Murphy is a 70 yo with hx o hyperlipidemia and a strong family hx of CAD Her LDL partical number has decreased from 200 to 1233.  Jan. 17, 2017: Doing well. Lipids are up a bit - but she is not on Zetia any longer.   Dec. 13, 2017:  Seen in follow up . Had an episode of double vision a month ago.  Saw her eye doctor .  Carotid duplex scan - was found to have mild bilateral plaque Exercising every day  No CP or dyspnea   Current Outpatient Prescriptions on File Prior to Visit  Medication Sig Dispense Refill  . aspirin EC 81 MG tablet Take 1 tablet (81 mg total) by mouth daily. 30 tablet 0  . Cholecalciferol (VITAMIN D3) 2000 UNITS capsule Take 1 capsule (2,000 Units total) by mouth daily. 100 capsule 3  . citalopram (CELEXA) 40 MG tablet TAKE ONE TABLET BY MOUTH ONE TIME DAILY 90 tablet 3  . ezetimibe (ZETIA) 10 MG tablet Take 1 tablet (10 mg total) by mouth daily. 30 tablet 11  . linaclotide (LINZESS) 145 MCG CAPS capsule Take 1 capsule (145 mcg total) by mouth daily before breakfast. 30 capsule 6  . nortriptyline (PAMELOR) 75 MG capsule Take 1 capsule (75 mg total) by mouth at bedtime.  Yearly physical is due in Dec must see MD for refills 30 capsule 0  . pantoprazole (PROTONIX) 40 MG tablet TAKE ONE TABLET BY MOUTH ONE TIME DAILY (Patient taking differently: TAKE ONE TABLET BY MOUTH ONE TIME DAILY PRN) 90 tablet 3  . rosuvastatin (CRESTOR) 10 MG tablet TAKE 1 TABLET BY MOUTH DAILY. 90 tablet 1  . VOLTAREN 1 % GEL Apply 2 g topically 4 (four) times daily. (Patient taking differently: Apply 2 g topically 4 (four) times daily as needed. ) 1 Tube 1  . zolpidem (AMBIEN) 10 MG tablet TAKE 1 TABLET BY MOUTH NIGHTLY AT BETIME 30 tablet 5  . fluticasone furoate-vilanterol (BREO ELLIPTA) 200-25 MCG/INH AEPB Inhale 1 puff into the lungs daily. (Patient taking differently: Inhale 1 puff into the lungs daily as needed. ) 60 each 6   No current facility-administered medications on file prior to visit.     Allergies  Allergen Reactions  . Linzess [Linaclotide]     Too strong  . Lipitor [Atorvastatin]     elev LFTs    Past Medical History:  Diagnosis Date  . Anxiety   . Hyperlipidemia   . Insomnia   .  RBBB     Past Surgical History:  Procedure Laterality Date  . COLONOSCOPY    . SHOULDER ARTHROSCOPY  10/2011   R Dr Meyer Cory    History  Smoking Status  . Never Smoker  Smokeless Tobacco  . Never Used    History  Alcohol Use No    Family History  Problem Relation Age of Onset  . Heart disease Mother 62    CABG  . Diabetes Mother   . Macular degeneration Mother   . Heart disease Father     Father died of MI at 4  . COPD Father   . Heart disease Brother   . Diabetes Brother   . Macular degeneration Brother     Reviw of Systems:  Reviewed in the HPI.  All other systems are negative.  Physical Exam: Blood pressure 110/78, pulse 80, resp. rate 16, height 5\' 3"  (1.6 m), weight 145 lb (65.8 kg). General: Well developed, well nourished, in no acute distress.  Head: Normocephalic, atraumatic, sclera non-icteric, mucus membranes are moist,   Neck: Supple.  Carotids are 2 + without bruits. No JVD   Lungs: Clear   Heart: RR, normla S1, S2  Abdomen: Soft, non-tender, non-distended with normal bowel sounds.  Msk:  Strength and tone are normal   Extremities: No clubbing or cyanosis. No edema.  Distal pedal pulses are 2+ and equal    Neuro: CN II - XII intact.  Alert and oriented X 3.   Psych:  Normal   ECG:  NSR at 81.  RBBB   , no changes  Assessment / Plan:   1. Strong family history of coronary artery disease  2. Hyperlipidemia- lipids  Are better.  Continue Crestor.  Recheck labs in 1 year.   3. ? TIA - had an episode of double vision for about 10-20 seconds.   I've recommended that she see neuro.  She is not having any other neuro symptoms   [name], MD  03/03/2016 8:38 AM    [address],  [address] [place], [place]  [address] Pager [phone] Phone: [phone]; Fax: [phone]

## 2016-03-03 NOTE — Patient Instructions (Signed)
Medication Instructions:  Your physician recommends that you continue on your current medications as directed. Please refer to the Current Medication list given to you today.   Labwork: Your physician recommends that you return for lab work in: 1 year on the day of or a few days before your office visit with Dr. [name].  You will need to FAST for this appointment - nothing to eat or drink after midnight the night before except water.   Testing/Procedures: None Ordered   Follow-Up: Your physician wants you to follow-up in: 1 year with Dr. [name].  You will receive a reminder letter in the mail two months in advance. If you don't receive a letter, please call our office to schedule the follow-up appointment.   If you need a refill on your cardiac medications before your next appointment, please call your pharmacy.   Thank you for choosing [facility]! Athziry Millican, RN [phone]    

## 2016-03-04 ENCOUNTER — Telehealth: Payer: Self-pay | Admitting: Internal Medicine

## 2016-03-04 DIAGNOSIS — H532 Diplopia: Secondary | ICD-10-CM

## 2016-03-04 NOTE — Telephone Encounter (Signed)
Also, patient states she has seen her cardiologist and another specialist recently and has had lots of labs.  Patient states she believes she is due for a CPE but if she could wait a few months before coming back in?

## 2016-03-04 NOTE — Telephone Encounter (Signed)
Patient states she had an issue with double vision awhile back and her optometrist told her to see her cardiologist.  Now her cardiologist wants her to see a neurologist.  Patient is requesting Dr. Camila Li to enter a referral to [facility] Neurologist.

## 2016-03-06 NOTE — Telephone Encounter (Signed)
Ok Done Thx 

## 2016-03-08 NOTE — Telephone Encounter (Signed)
Notified patient.

## 2016-03-25 ENCOUNTER — Other Ambulatory Visit: Payer: Self-pay | Admitting: Surgery

## 2016-03-25 DIAGNOSIS — E041 Nontoxic single thyroid nodule: Secondary | ICD-10-CM

## 2016-03-30 DIAGNOSIS — E042 Nontoxic multinodular goiter: Secondary | ICD-10-CM | POA: Diagnosis not present

## 2016-03-31 ENCOUNTER — Encounter: Payer: Self-pay | Admitting: Pulmonary Disease

## 2016-03-31 ENCOUNTER — Ambulatory Visit (INDEPENDENT_AMBULATORY_CARE_PROVIDER_SITE_OTHER): Payer: Medicare Other | Admitting: Pulmonary Disease

## 2016-03-31 VITALS — BP 98/66 | HR 80 | Ht 63.0 in | Wt 150.0 lb

## 2016-03-31 DIAGNOSIS — J4530 Mild persistent asthma, uncomplicated: Secondary | ICD-10-CM | POA: Diagnosis not present

## 2016-03-31 DIAGNOSIS — K219 Gastro-esophageal reflux disease without esophagitis: Secondary | ICD-10-CM | POA: Diagnosis not present

## 2016-03-31 DIAGNOSIS — R059 Cough, unspecified: Secondary | ICD-10-CM

## 2016-03-31 DIAGNOSIS — R05 Cough: Secondary | ICD-10-CM

## 2016-03-31 NOTE — Patient Instructions (Signed)
1.  Please take your protonix daily as prescribed  2.  Restart your Breo and use daily  3.  Drink water to help with thinning mucus.  You can also use over the counter Delsym or Mucinex.   4.  Please use your Flonase > 1 spray daily in each nostril  5.  Follow up with Dr. Halford Chessman in one month to review cough symptoms after you have used above medications.  Keep a "cough" journal to note your daily symptoms 6. Eat small frequent meals, chew your food carefully.  Avoid caffeine, chocolate, spicy foods, alcohol and tomato based foods as these can aggravate your reflux.   7.  Follow up with Dr. Ardis Hughs as planned.  8.  Please call if you have new or worsening symptoms.

## 2016-03-31 NOTE — Progress Notes (Signed)
[facility]   Chief Complaint  Patient presents with  . Office Visit    Pt stated she has had a cough that produces green mucus since November. No chest tightness, SOB, or wheezing.     Current Outpatient Prescriptions on File Prior to Visit  Medication Sig  . aspirin EC 81 MG tablet Take 1 tablet (81 mg total) by mouth daily.  . Cholecalciferol (VITAMIN D3) 2000 UNITS capsule Take 1 capsule (2,000 Units total) by mouth daily.  . citalopram (CELEXA) 40 MG tablet TAKE ONE TABLET BY MOUTH ONE TIME DAILY  . ezetimibe (ZETIA) 10 MG tablet Take 1 tablet (10 mg total) by mouth daily.  . fluticasone furoate-vilanterol (BREO ELLIPTA) 200-25 MCG/INH AEPB Inhale 1 puff into the lungs daily. (Patient taking differently: Inhale 1 puff into the lungs daily as needed. )  . linaclotide (LINZESS) 145 MCG CAPS capsule Take 1 capsule (145 mcg total) by mouth daily before breakfast.  . nortriptyline (PAMELOR) 75 MG capsule TAKE ONE CAPSULE BY MOUTH AT BEDTIME YEARLY PHYSICAL IS DUE IN DEC, MUST SEE MD FOR REFILLS  . pantoprazole (PROTONIX) 40 MG tablet TAKE ONE TABLET BY MOUTH ONE TIME DAILY (Patient taking differently: TAKE ONE TABLET BY MOUTH ONE TIME DAILY PRN)  . rosuvastatin (CRESTOR) 10 MG tablet TAKE 1 TABLET BY MOUTH DAILY.  . VOLTAREN 1 % GEL Apply 2 g topically 4 (four) times daily. (Patient taking differently: Apply 2 g topically 4 (four) times daily as needed. )  . zolpidem (AMBIEN) 10 MG tablet TAKE 1 TABLET BY MOUTH NIGHTLY AT BETIME   No current facility-administered medications on file prior to visit.      Studies: Echo 06/30/14 >> EF 55 to 123456, grade 1 diastolic dysfx PFT [id number] >> FEV1 1.88 (81%), FEV1% 86, TLC 4.29 (86%), DLCO 72% FeNO 12/17/15 >> 75  Past Medical Hx:  has a past medical history of Anxiety; Hyperlipidemia; Insomnia; and RBBB.   Past Surgical hx, Allergies, Family hx, Social hx all reviewed.  Vital Signs BP 98/66 (BP Location: Right Arm, Patient Position:  Sitting, Cuff Size: Normal)   Pulse 80   Ht 5\' 3"  (1.6 m)   Wt 150 lb (68 kg)   SpO2 97%   BMI 26.57 kg/m   History of Present Illness Evelyn Murphy is a 71 y.o. female, never smoker, with a history of mild persistent asthma (followed by Dr. Halford Chessman), cough, dysphagia and GERD who presented to the pulmonary office for 03/31/16 with complaints of cough.    Patient was last seen 01/14/16 for follow up of asthma flare.  She had been treated with steroids and BREO.  After improvement, the patient did not want to continue taking BREO.  The patient reports she feels she has had a dry / hoarse cough since November.  She reports she felt she had the flu in November but improved from that illness.  However, she has had persistent cough that is only occasionally productive of mucus.  She denies fevers/chills, chest pain, pain with inspiration, wheezing or chest tightness.  She also notes she has had increased difficulty with food "getting hung up" when she eats.  She avoids bread and rice due to this issues.  She reports she has to drink a lot of liquid at times to "get food to go down & I can feel it move".  She has a planned visit with Dr. Ardis Hughs in February for evaluation.    Physical Exam  General - well developed adult  F in no acute distress ENT - No sinus tenderness, no oral exudate, no LAN Cardiac - s1s2 regular, no murmur, rub or gallops Chest - even/non-labored, lungs bilaterally clear. No wheeze/rales Back - No focal tenderness Abd - Soft, non-tender Ext - No peripheral edema Neuro - Normal strength Skin - No rashes or lesions Psych - normal mood, and behavior   Assessment/Plan  1. Cough - suspect multifactorial in the setting of mild asthma, recent URI and GERD.  She has not been using BREO or protonix as controller agents.    - encouraged daily BREO and protonix use, reviewed again that these are controller agents and not rescue - water, mucinex or delsym for cough / mucolytic - use  flonase daily  - follow up with Dr. Halford Chessman or NP in one month for cough review - return if new or worsening symptoms  2. Mild Persistent Asthma - without acute exacerbation  - encouraged daily BREO use  3.  GERD / Dysphagia   - continue protonix daily  - reviewed small meals, foods to avoid for acid reflux - follow up with Dr. Ardis Hughs for evaluation, may need EGD given dysphagia, concern for possible stricture   Patient Instructions  1.  Please take your protonix daily as prescribed  2.  Restart your Breo and use daily  3.  Drink water to help with thinning mucus.  You can also use over the counter Delsym or Mucinex.   4.  Please use your Flonase > 1 spray daily in each nostril  5.  Follow up with Dr. Halford Chessman in one month to review cough symptoms after you have used above medications.  Keep a "cough" journal to note your daily symptoms 6. Eat small frequent meals, chew your food carefully.  Avoid caffeine, chocolate, spicy foods, alcohol and tomato based foods as these can aggravate your reflux.   7.  Follow up with Dr. Ardis Hughs as planned.  8.  Please call if you have new or worsening symptoms.       Noe Gens, NP-C [facility]  [phone] 03/31/2016, 9:40 AM

## 2016-03-31 NOTE — Progress Notes (Signed)
I have reviewed and agree with assessment/plan.  Chesley Mires, MD [facility] 03/31/2016, 11:24 AM Pager:  [phone]

## 2016-04-01 ENCOUNTER — Ambulatory Visit
Admission: RE | Admit: 2016-04-01 | Discharge: 2016-04-01 | Disposition: A | Discharge status: Home / Self Care | Payer: Medicare Other | Source: Ambulatory Visit | Attending: Surgery | Admitting: Surgery

## 2016-04-01 DIAGNOSIS — E041 Nontoxic single thyroid nodule: Secondary | ICD-10-CM

## 2016-04-01 DIAGNOSIS — E042 Nontoxic multinodular goiter: Secondary | ICD-10-CM | POA: Diagnosis not present

## 2016-04-13 ENCOUNTER — Ambulatory Visit (INDEPENDENT_AMBULATORY_CARE_PROVIDER_SITE_OTHER): Payer: Medicare Other | Admitting: Orthopaedic Surgery

## 2016-04-13 ENCOUNTER — Ambulatory Visit (INDEPENDENT_AMBULATORY_CARE_PROVIDER_SITE_OTHER): Payer: Medicare Other

## 2016-04-13 ENCOUNTER — Encounter (INDEPENDENT_AMBULATORY_CARE_PROVIDER_SITE_OTHER): Payer: Self-pay | Admitting: Orthopaedic Surgery

## 2016-04-13 VITALS — BP 111/77 | HR 76 | Ht 63.0 in | Wt 145.0 lb

## 2016-04-13 DIAGNOSIS — M25512 Pain in left shoulder: Secondary | ICD-10-CM

## 2016-04-13 DIAGNOSIS — M7542 Impingement syndrome of left shoulder: Secondary | ICD-10-CM

## 2016-04-13 DIAGNOSIS — G8929 Other chronic pain: Secondary | ICD-10-CM

## 2016-04-13 NOTE — Progress Notes (Signed)
Office Visit Note   Patient: Evelyn Murphy           Date of Birth: 1945-06-07           MRN: [id number] Visit Date: 04/13/2016              Requested by: Cassandria Anger, MD [address] [place], [place] [address] PCP: Walker Kehr, MD   Assessment & Plan: Visit Diagnoses:  1. Impingement syndrome of left shoulder   2. Chronic left shoulder pain     Plan:  #1: MRI scan of the left shoulder #2: Follow-up after MRI  Follow-Up Instructions: Return in about 2 weeks (around 04/27/2016) for review of mri.   Orders:  Orders Placed This Encounter  Procedures  . XR Shoulder Left   No orders of the defined types were placed in this encounter.     Procedures: No procedures performed   Clinical Data: No additional findings.   Subjective: Chief Complaint  Patient presents with  . Left Shoulder - Pain, Follow-up    [name] is a very pleasant 71 year old white female who is seen today for reevaluation of her left shoulder up. Been seen back in May 2017 as well as November 2018 was beneficial that she has recently fallen with her arm tucked and landed onto the lateral aspect of her left shoulder. She is now having increasing pain discomfort in the shoulder. Both those visits for her shoulder were treated with corticosteroid injections. Her last one in November did not have resounding benefit.  Pt present with Left shoulder pain follow up from last visit. Xrays were obtained last visit and cortisone injection in Left shoulder but no relief from the injection. She cannot sit on the couch "slumping" position hurts    Review of Systems  Constitutional: Negative.   HENT: Negative.   Respiratory: Negative.   Cardiovascular: Negative.   Gastrointestinal: Negative.   Genitourinary: Negative.   Skin: Negative.   Neurological: Negative.   Hematological: Negative.   Psychiatric/Behavioral: Negative.      Objective: Vital Signs: BP 111/77   Pulse 76   Ht 5\' 3"  (1.6 m)   Wt  145 lb (65.8 kg)   BMI 25.69 kg/m   Physical Exam  Constitutional: She is oriented to person, place, and time. She appears well-developed and well-nourished.  HENT:  Head: Normocephalic and atraumatic.  Eyes: EOM are normal. Pupils are equal, round, and reactive to light.  Pulmonary/Chest: Effort normal.  Neurological: She is alert and oriented to person, place, and time.  Skin: Skin is warm and dry.  Psychiatric: She has a normal mood and affect. Her behavior is normal. Judgment and thought content normal.    Right Shoulder Exam  Right shoulder exam is normal.   Left Shoulder Exam   Tenderness  The patient is experiencing no tenderness.     Range of Motion  Active Abduction: 160  Passive Abduction: 160  Forward Flexion: 170  External Rotation: 80  Internal Rotation 90 degrees: 50   Muscle Strength  Abduction: 3/5  Internal Rotation: 4/5  External Rotation: 3/5  Supraspinatus: 3/5  Biceps: 4/5   Tests  Impingement: positive  Other  Sensation: normal Pulse: present       Specialty Comments:  No specialty comments available.  Imaging: Xr Shoulder Left  Result Date: 04/13/2016 Two-view x-rays of the left shoulder reveals a type II acromion. She has some spurring at the inferior neck of the humerus. Before meals joint does  have some distal clavicle ostial lysis.    PMFS History: Patient Active Problem List   Diagnosis Date Noted  . Ulnar neuropathy 08/25/2015  . Paresthesia of hand 08/25/2015  . Dyslipidemia 02/24/2015  . Cough 11/27/2014  . Phalanx, proximal fracture of finger 10/15/2014  . Anemia 10/03/2014  . Finger sprain 08/12/2014  . Quadriceps tendinitis 07/22/2014  . Constipation 07/04/2014  . Altered mental status   . Slurred speech 06/29/2014  . Unsteady gait 06/29/2014  . Confusion 06/29/2014  . TIA (transient ischemic attack) 06/29/2014  . Urinary frequency 01/31/2014  . Diastolic dysfunction A999333  . Chest pain 11/22/2013    . Left shoulder pain 11/22/2013  . Unspecified constipation 07/31/2013  . GERD (gastroesophageal reflux disease) 01/24/2013  . Asthma 07/22/2012  . Nonspecific abnormal electrocardiogram (ECG) (EKG) 07/10/2012  . Well adult exam 07/10/2012  . Abnormal CBC 01/11/2012  . Cerumen impaction 07/06/2011  . Shoulder pain, right 07/06/2011  . DYSPHAGIA UNSPECIFIED 03/04/2010  . PELVIC PAIN, LEFT 07/18/2008  . RENAL CYST 05/14/2008  . ABNORMAL LIVER FUNCTION TESTS 04/30/2008  . Hyperlipidemia 02/06/2007  . INSOMNIA, PERSISTENT 02/06/2007  . Depression with anxiety 12/02/2006   Past Medical History:  Diagnosis Date  . Anxiety   . Hyperlipidemia   . Insomnia   . RBBB     Family History  Problem Relation Age of Onset  . Heart disease Mother 10    CABG  . Diabetes Mother   . Macular degeneration Mother   . Heart disease Father     Father died of MI at 17  . COPD Father   . Heart disease Brother   . Diabetes Brother   . Macular degeneration Brother     Past Surgical History:  Procedure Laterality Date  . COLONOSCOPY    . SHOULDER ARTHROSCOPY  10/2011   R Dr Meyer Cory   Social History   Occupational History  . Retired    Social History Main Topics  . Smoking status: Never Smoker  . Smokeless tobacco: Never Used  . Alcohol use No  . Drug use: No  . Sexual activity: Not on file

## 2016-04-18 ENCOUNTER — Ambulatory Visit
Admission: RE | Admit: 2016-04-18 | Discharge: 2016-04-18 | Disposition: A | Discharge status: Home / Self Care | Payer: Medicare Other | Source: Ambulatory Visit | Attending: Orthopedic Surgery | Admitting: Orthopedic Surgery

## 2016-04-18 DIAGNOSIS — M25512 Pain in left shoulder: Secondary | ICD-10-CM | POA: Diagnosis not present

## 2016-04-23 DIAGNOSIS — E042 Nontoxic multinodular goiter: Secondary | ICD-10-CM | POA: Diagnosis not present

## 2016-04-25 ENCOUNTER — Other Ambulatory Visit: Payer: Self-pay | Admitting: Cardiovascular Disease

## 2016-04-28 ENCOUNTER — Ambulatory Visit: Payer: Medicare Other | Admitting: Adult Health

## 2016-04-28 ENCOUNTER — Encounter (INDEPENDENT_AMBULATORY_CARE_PROVIDER_SITE_OTHER): Payer: Self-pay | Admitting: Orthopedic Surgery

## 2016-04-28 ENCOUNTER — Ambulatory Visit (INDEPENDENT_AMBULATORY_CARE_PROVIDER_SITE_OTHER): Payer: Medicare Other | Admitting: Orthopedic Surgery

## 2016-04-28 VITALS — BP 115/72 | HR 84 | Resp 14 | Ht 63.0 in | Wt 140.0 lb

## 2016-04-28 DIAGNOSIS — M25512 Pain in left shoulder: Secondary | ICD-10-CM

## 2016-04-28 DIAGNOSIS — M19012 Primary osteoarthritis, left shoulder: Secondary | ICD-10-CM

## 2016-04-28 DIAGNOSIS — G8929 Other chronic pain: Secondary | ICD-10-CM

## 2016-04-28 MED ORDER — METHYLPREDNISOLONE ACETATE 40 MG/ML IJ SUSP
80.0000 mg | INTRAMUSCULAR | Status: AC | PRN
  Administered 2016-04-28: 80 mg

## 2016-04-28 MED ORDER — BUPIVACAINE HCL 0.5 % IJ SOLN
2.0000 mL | INTRAMUSCULAR | Status: AC | PRN
  Administered 2016-04-28: 2 mL via INTRA_ARTICULAR

## 2016-04-28 MED ORDER — LIDOCAINE HCL 1 % IJ SOLN
2.0000 mL | INTRAMUSCULAR | Status: AC | PRN
  Administered 2016-04-28: 2 mL

## 2016-04-28 NOTE — Progress Notes (Signed)
Office Visit Note   Patient: Evelyn Murphy           Date of Birth: 1946-02-14           MRN: [id number] Visit Date: 04/28/2016              Requested by: Cassandria Anger, MD [address] [place], [place] [address] PCP: Walker Kehr, MD   Assessment & Plan: Visit Diagnoses:  1. Primary osteoarthritis, left shoulder   2. Chronic left shoulder pain   3. Osteoarthritis of left glenohumeral joint     Plan:  #1: Corticosteroid injection intra-articularly and subacromially was performed on the left shoulder. #2: We will see how she does with the intra-articular injection and neck she had some benefit post injection.  Follow-Up Instructions: Return in about 3 weeks (around 05/19/2016).   Orders:  Orders Placed This Encounter  Procedures  . Large Joint Injection/Arthrocentesis   No orders of the defined types were placed in this encounter.     Procedures: Large Joint Inj Date/Time: 04/28/2016 9:30 AM Performed by: Biagio Borg D Authorized by: Biagio Borg D   Consent Given by:  Patient Timeout: prior to procedure the correct patient, procedure, and site was verified   Indications:  Pain Location:  Shoulder Site:  L glenohumeral Prep: patient was prepped and draped in usual sterile fashion   Needle Size:  25 G Needle Length:  1.5 inches Approach:  Posterior Ultrasound Guidance: No   Fluoroscopic Guidance: No   Arthrogram: No   Medications:  2 mL lidocaine 1 %; 2 mL bupivacaine 0.5 %; 80 mg methylPREDNISolone acetate 40 MG/ML Aspiration Attempted: No   Patient tolerance:  Patient tolerated the procedure well with no immediate complications     Clinical Data: No additional findings.   Subjective: Chief Complaint  Patient presents with  . Left Shoulder - Follow-up    Evelyn Murphy is a very pleasant 71 year old white female who is seen today for reevaluation of her left shoulder up. Been seen back in May 2017 as well as November 2018 was beneficial that she  has recently fallen with her arm tucked and landed onto the lateral aspect of her left shoulder. She is now having increasing pain discomfort in the shoulder. Both those visits for her shoulder were treated with corticosteroid injections. Her last one in November did not have resounding benefit.  Pt present with Left shoulder pain follow up from last visit. Xrays were obtained last visit and cortisone injection in Left shoulder but no relief from the injection. She cannot sit on the couch "slumping" position hurts  MRI scan was obtained and returns today for review of that.    Review of Systems  Constitutional: Negative.   HENT: Negative.   Respiratory: Negative.   Cardiovascular: Negative.   Gastrointestinal: Negative.   Genitourinary: Negative.   Skin: Negative.   Neurological: Negative.   Hematological: Negative.   Psychiatric/Behavioral: Negative.      Objective: Vital Signs: BP 115/72 (BP Location: Left Arm, Patient Position: Sitting, Cuff Size: Normal)   Pulse 84   Resp 14   Ht 5\' 3"  (1.6 m)   Wt 140 lb (63.5 kg)   BMI 24.80 kg/m   Physical Exam  Constitutional: She is oriented to person, place, and time. She appears well-developed and well-nourished.  HENT:  Head: Normocephalic and atraumatic.  Eyes: EOM are normal. Pupils are equal, round, and reactive to light.  Pulmonary/Chest: Effort normal.  Neurological: She is alert and  oriented to person, place, and time.  Skin: Skin is warm and dry.  Psychiatric: She has a normal mood and affect. Her behavior is normal. Judgment and thought content normal.    Right Shoulder Exam  Right shoulder exam is normal.   Left Shoulder Exam   Tenderness  The patient is experiencing no tenderness.     Range of Motion  Active Abduction: 160  Passive Abduction: 160  Forward Flexion: 170  External Rotation: 80  Internal Rotation 90 degrees: 50   Muscle Strength  Abduction: 3/5  Internal Rotation: 4/5  External  Rotation: 3/5  Supraspinatus: 3/5  Biceps: 4/5   Tests  Impingement: positive  Other  Sensation: normal Pulse: present       Specialty Comments:  No specialty comments available.  Imaging: CLINICAL DATA:  Left shoulder pain for several months.  EXAM: MRI OF THE LEFT SHOULDER WITHOUT CONTRAST  TECHNIQUE: Multiplanar, multisequence MR imaging of the shoulder was performed. No intravenous contrast was administered.  COMPARISON:  04/13/2016 radiographs  FINDINGS: Rotator cuff:  Unremarkable  Muscles:  Unremarkable  Biceps long head:  Unremarkable  Acromioclavicular Joint: No significant AC joint arthropathy. Type II acromion. Laterally downsloping acromion may predispose to impingement.  Glenohumeral Joint: Mild to moderate degenerative glenohumeral chondral thinning. Minimal spurring of the humeral head.  Labrum: At the level of biceps anchor of there is fluid signal intensity undercutting the labrum on image 10 9/10. This is a borderline appearance for superior labral tear. I do not see the fluid signal tracking back posterior to the biceps anchor and there is also a sublabral foramen.  Bones: Small degenerative subcortical cystic lesion posteriorly along the anatomic neck of the humerus, likely degenerative.  Other: No supplemental non-categorized findings.  IMPRESSION: 1. Fluid signal undercutting the biceps anchor is at the borderline of were a superior labral tear can be reliably diagnosed. It may simply be part of the sublabral foramen, and I do not see definite fluid signal undercutting the labrum posterior to the biceps anchor. 2. The rotator cuff is intact. 3. Mild to moderate degenerative glenohumeral chondral thinning. 4. Lateral downsloping acromion may predispose to impingement but the rotator cuff appears healthy currently.  PMFS History: Patient Active Problem List   Diagnosis Date Noted  . Ulnar neuropathy 08/25/2015  .  Paresthesia of hand 08/25/2015  . Dyslipidemia 02/24/2015  . Cough 11/27/2014  . Phalanx, proximal fracture of finger 10/15/2014  . Anemia 10/03/2014  . Finger sprain 08/12/2014  . Quadriceps tendinitis 07/22/2014  . Constipation 07/04/2014  . Altered mental status   . Slurred speech 06/29/2014  . Unsteady gait 06/29/2014  . Confusion 06/29/2014  . TIA (transient ischemic attack) 06/29/2014  . Urinary frequency 01/31/2014  . Diastolic dysfunction A999333  . Chest pain 11/22/2013  . Left shoulder pain 11/22/2013  . Unspecified constipation 07/31/2013  . GERD (gastroesophageal reflux disease) 01/24/2013  . Asthma 07/22/2012  . Nonspecific abnormal electrocardiogram (ECG) (EKG) 07/10/2012  . Well adult exam 07/10/2012  . Abnormal CBC 01/11/2012  . Cerumen impaction 07/06/2011  . Shoulder pain, right 07/06/2011  . DYSPHAGIA UNSPECIFIED 03/04/2010  . PELVIC PAIN, LEFT 07/18/2008  . RENAL CYST 05/14/2008  . ABNORMAL LIVER FUNCTION TESTS 04/30/2008  . Hyperlipidemia 02/06/2007  . INSOMNIA, PERSISTENT 02/06/2007  . Depression with anxiety 12/02/2006   Past Medical History:  Diagnosis Date  . Anxiety   . Hyperlipidemia   . Insomnia   . RBBB     Family History  Problem Relation Age  of Onset  . Heart disease Mother 94    CABG  . Diabetes Mother   . Macular degeneration Mother   . Heart disease Father     Father died of MI at 26  . COPD Father   . Heart disease Brother   . Diabetes Brother   . Macular degeneration Brother     Past Surgical History:  Procedure Laterality Date  . COLONOSCOPY    . SHOULDER ARTHROSCOPY  10/2011   R Dr Meyer Cory   Social History   Occupational History  . Retired    Social History Main Topics  . Smoking status: Never Smoker  . Smokeless tobacco: Never Used  . Alcohol use No  . Drug use: No  . Sexual activity: Not on file

## 2016-04-30 ENCOUNTER — Other Ambulatory Visit: Payer: Self-pay | Admitting: Internal Medicine

## 2016-05-11 ENCOUNTER — Ambulatory Visit: Payer: Medicare Other | Admitting: Internal Medicine

## 2016-05-19 ENCOUNTER — Telehealth (INDEPENDENT_AMBULATORY_CARE_PROVIDER_SITE_OTHER): Payer: Self-pay | Admitting: Orthopaedic Surgery

## 2016-05-19 NOTE — Telephone Encounter (Signed)
Patient canc appt for tomorrow.  L shoulder injection worked and she is doing better

## 2016-05-21 ENCOUNTER — Ambulatory Visit (INDEPENDENT_AMBULATORY_CARE_PROVIDER_SITE_OTHER): Payer: Medicare Other | Admitting: Orthopaedic Surgery

## 2016-05-25 ENCOUNTER — Encounter: Payer: Self-pay | Admitting: Neurology

## 2016-05-25 ENCOUNTER — Ambulatory Visit (INDEPENDENT_AMBULATORY_CARE_PROVIDER_SITE_OTHER): Payer: Medicare Other | Admitting: Neurology

## 2016-05-25 VITALS — BP 122/70 | HR 78 | Wt 145.0 lb

## 2016-05-25 DIAGNOSIS — H532 Diplopia: Secondary | ICD-10-CM

## 2016-05-25 NOTE — Progress Notes (Signed)
NEUROLOGY CONSULTATION NOTE  AMALY BICKERSTAFF MRN: [id number] DOB: 03-16-1946  Referring provider: Dr. Alain Marion Primary care provider: Dr. Alain Marion  Reason for consult:  Diplopia  HISTORY OF PRESENT ILLNESS: Evelyn Murphy is a 71 year old female with asthma, hyperlipidemia and history of TIA who presents for diplopia.  History supplemented by PCP and cardiology note.  In November, she had a two week period where she experienced horizontal diplopia daily, lasting just seconds.  When she covered her right eye, she still appreciated the diplopia.  She had brief associated dizziness directly related to the double vision.  Otherwise there was no associated headache, slurred speech, facial droop or focal numbness or weakness.  It was not associated with fatigue or time of day.  She saw her optometrist who told her that her "left eye was worse" but seems to be referring to her baseline poorer vision in th left eye.  I don't have the notes to review.  She had a carotid doppler which revealed some plaque but no hemodynamically significant stenosis.  She saw cardiology in December.  EKG showed stable NSR 81 with RBBB.  No further workup was recommended.   Last week, she was watching the news and noted double vision when reading the moving sentences at the bottom of the screen.  Otherwise, she reports no other symptoms.  She does report a remote history of headaches which resolved at age 20.  08/25/15: TSH 1.59 01/30/16: LDL 79; CMP with Na 140, K 4.6, Cl 102, CO2 28, glucose 91, BUN 23, Cr 0.82, total bili 0.5, ALP 93, AST 36 and ALT 43.  She has past history of transient episode. She was admitted to the hospital from 06/29/14 to 07/01/14 with acute onset of slurred speech, slight confusion, weakness and unsteady gait.  MRI of brain was performed and personally reviewed.  It revealed no definite acute infarct.  She had no focal findings on exam.  She took several medications that likely contributed to  her confusion, such as Ambien, nortriptyline, TheraFlu and NyQuil.  She was evaluated by neurology and believed not to have been a cerebral vascular event.  PAST MEDICAL HISTORY: Past Medical History:  Diagnosis Date  . Anxiety   . Asthma   . Depression   . Hyperlipidemia   . Insomnia   . RBBB   . TIA (transient ischemic attack)    cerebral    PAST SURGICAL HISTORY: Past Surgical History:  Procedure Laterality Date  . COLONOSCOPY    . SHOULDER ARTHROSCOPY  10/2011   R Dr Meyer Cory    MEDICATIONS: Current Outpatient Prescriptions on File Prior to Visit  Medication Sig Dispense Refill  . aspirin EC 81 MG tablet Take 1 tablet (81 mg total) by mouth daily. 30 tablet 0  . Cholecalciferol (VITAMIN D3) 2000 UNITS capsule Take 1 capsule (2,000 Units total) by mouth daily. 100 capsule 3  . citalopram (CELEXA) 40 MG tablet TAKE ONE TABLET BY MOUTH ONE TIME DAILY 90 tablet 1  . ezetimibe (ZETIA) 10 MG tablet Take 1 tablet (10 mg total) by mouth daily. 30 tablet 9  . fluticasone furoate-vilanterol (BREO ELLIPTA) 200-25 MCG/INH AEPB Inhale 1 puff into the lungs daily. (Patient taking differently: Inhale 1 puff into the lungs daily as needed. ) 60 each 6  . linaclotide (LINZESS) 145 MCG CAPS capsule Take 1 capsule (145 mcg total) by mouth daily before breakfast. 30 capsule 6  . nortriptyline (PAMELOR) 75 MG capsule TAKE ONE CAPSULE BY MOUTH  AT BEDTIME YEARLY PHYSICAL IS DUE IN DEC, MUST SEE MD FOR REFILLS 30 capsule 0  . pantoprazole (PROTONIX) 40 MG tablet TAKE ONE TABLET BY MOUTH ONE TIME DAILY (Patient taking differently: TAKE ONE TABLET BY MOUTH ONE TIME DAILY PRN) 90 tablet 3  . VENTOLIN HFA 108 (90 Base) MCG/ACT inhaler     . VOLTAREN 1 % GEL Apply 2 g topically 4 (four) times daily. (Patient taking differently: Apply 2 g topically 4 (four) times daily as needed. ) 1 Tube 1  . zolpidem (AMBIEN) 10 MG tablet TAKE 1 TABLET BY MOUTH NIGHTLY AT BETIME 30 tablet 5   No current  facility-administered medications on file prior to visit.     ALLERGIES: Allergies  Allergen Reactions  . Linzess [Linaclotide]     Too strong  . Lipitor [Atorvastatin]     elev LFTs    FAMILY HISTORY: Family History  Problem Relation Age of Onset  . Heart disease Mother 80    CABG  . Diabetes Mother   . Macular degeneration Mother   . Heart disease Father     Father died of MI at 36  . COPD Father   . Heart disease Brother   . Diabetes Brother   . Macular degeneration Brother     SOCIAL HISTORY: Social History   Social History  . Marital status: Married    Spouse name: N/A  . Number of children: 2  . Years of education: N/A   Occupational History  . Retired    Social History Main Topics  . Smoking status: Never Smoker  . Smokeless tobacco: Never Used  . Alcohol use No  . Drug use: No  . Sexual activity: Not on file   Other Topics Concern  . Not on file   Social History Narrative   Married retired 2 daughters       REVIEW OF SYSTEMS: Constitutional: No fevers, chills, or sweats, no generalized fatigue, change in appetite Eyes: No visual changes, double vision, eye pain Ear, nose and throat: No hearing loss, ear pain, nasal congestion, sore throat Cardiovascular: No chest pain, palpitations Respiratory:  No shortness of breath at rest or with exertion, wheezes GastrointestinaI: No nausea, vomiting, diarrhea, abdominal pain, fecal incontinence Genitourinary:  No dysuria, urinary retention or frequency Musculoskeletal:  No neck pain, back pain Integumentary: No rash, pruritus, skin lesions Neurological: as above Psychiatric: No depression, insomnia, anxiety Endocrine: No palpitations, fatigue, diaphoresis, mood swings, change in appetite, change in weight, increased thirst Hematologic/Lymphatic:  No purpura, petechiae. Allergic/Immunologic: no itchy/runny eyes, nasal congestion, recent allergic reactions, rashes  PHYSICAL EXAM: Vitals:   05/25/16  0911  BP: 122/70  Pulse: 78   General: No acute distress.  Patient appears well-groomed.  Head:  Normocephalic/atraumatic Eyes:  fundi examined but not visualized Neck: supple, no paraspinal tenderness, full range of motion Back: No paraspinal tenderness Heart: regular rate and rhythm Lungs: Clear to auscultation bilaterally. Vascular: No carotid bruits. Neurological Exam: Mental status: alert and oriented to person, place, and time, recent and remote memory intact, fund of knowledge intact, attention and concentration intact, speech fluent and not dysarthric, language intact. Cranial nerves: CN I: not tested CN II: pupils equal, round and reactive to light, visual fields intact CN III, IV, VI:  Range of motion grossly intact but she endorses horizontal double vision when looking to the left, no nystagmus, no ptosis CN V: facial sensation intact CN VII: upper and lower face symmetric CN VIII: hearing intact CN IX,  X: gag intact, uvula midline CN XI: sternocleidomastoid and trapezius muscles intact CN XII: tongue midline Bulk & Tone: normal, no fasciculations. Motor:  5/5 throughout  Sensation: temperature and vibration sensation intact. Deep Tendon Reflexes:  2+ throughout, toes downgoing.  Finger to nose testing:  Without dysmetria.  Heel to shin:  Without dysmetria.  Gait:  Normal station and stride.  Able to turn and tandem walk. Romberg negative.  IMPRESSION: 1.  Intermittent monocular diplopia in the left eye.  As it was monocular, more likely to be a migraine phenomenon (although there is rarely occipital stroke which may cause a monocular diplopia).  Myasthenia gravis not suspected, as it was monocular, not fluctuating and not a chronic issue.   2.  She endorses horizontal diplopia with left gaze, although ocular paresis is not appreciated.  She said these findings were seen on her optometrist's exam as well.  PLAN: 1.  We will get MRI of brain.  If  unremarkable/unexplainable, I will assume the event was migraine. 2.  Regarding the diplopia with left gaze, we will see what the MRI shows.  If unremarkable, will refer to ophthalmology 3.  Will get optometrist's notes. 4.  Further recommendations pending results. 5.  In the meantime, she is already on ASA and statin therapy.  Thank you for allowing me to take part in the care of this patient.  Metta Clines, DO  CC:  Lew Dawes, MD

## 2016-05-25 NOTE — Patient Instructions (Signed)
1.  We will first check MRI of brain 2.  If unremarkable, I would like to refer you to ophthalmology for exam of double vision when looking towards the left. 3.  Further recommendations pending results.

## 2016-05-27 ENCOUNTER — Other Ambulatory Visit: Payer: Self-pay | Admitting: Internal Medicine

## 2016-06-01 ENCOUNTER — Other Ambulatory Visit: Payer: Medicare Other

## 2016-06-03 ENCOUNTER — Other Ambulatory Visit: Payer: Self-pay | Admitting: Internal Medicine

## 2016-06-08 NOTE — Telephone Encounter (Signed)
OK to fill this/these prescription(s) with additional refills x 5 mo Thank you!

## 2016-06-08 NOTE — Telephone Encounter (Signed)
Pt wants to follow-up on the Zolpidem.

## 2016-06-09 ENCOUNTER — Inpatient Hospital Stay: Admission: RE | Admit: 2016-06-09 | Payer: Medicare Other | Source: Ambulatory Visit

## 2016-06-09 NOTE — Telephone Encounter (Signed)
Notified pt refill has been called into [facility] had to leave on pharmacy vm...Evelyn Murphy

## 2016-06-15 ENCOUNTER — Encounter: Payer: Self-pay | Admitting: Internal Medicine

## 2016-06-15 ENCOUNTER — Ambulatory Visit (INDEPENDENT_AMBULATORY_CARE_PROVIDER_SITE_OTHER): Payer: Medicare Other | Admitting: Internal Medicine

## 2016-06-15 VITALS — BP 94/66 | HR 84 | Ht 62.5 in

## 2016-06-15 DIAGNOSIS — K5901 Slow transit constipation: Secondary | ICD-10-CM

## 2016-06-15 DIAGNOSIS — R1310 Dysphagia, unspecified: Secondary | ICD-10-CM

## 2016-06-15 DIAGNOSIS — R1319 Other dysphagia: Secondary | ICD-10-CM

## 2016-06-15 DIAGNOSIS — K219 Gastro-esophageal reflux disease without esophagitis: Secondary | ICD-10-CM | POA: Diagnosis not present

## 2016-06-15 NOTE — Progress Notes (Signed)
   Evelyn Murphy 71 y.o. 1945/12/07 [id number]  Assessment & Plan:   1. Esophageal dysphagia   2. Gastroesophageal reflux disease, esophagitis presence not specified   3. Slow transit constipation     I have recommended an upper GI endoscopy just to make sure there is nothing serious regarding her dysphagia and she should take her PPI daily. She probably has esophagitis related problems, could have eosinophilic esophagitis and malignancy is unlikely but she is never had a diagnostic EGD because of the dysphagia despite having that recommended in 2015. If she is doing well without any problems on the PPI, and no dysphagia when she comes as long as there is no stricture etc. probably doesn't need dilation.  The risks and benefits as well as alternatives of endoscopic procedure(s) have been discussed and reviewed. All questions answered. The patient agrees to proceed.  For the constipation she is going to try using 72 g Linzess as needed  I appreciate the opportunity to care for this patient. CC: Walker Kehr, MD   Subjective:   Chief Complaint: Dysphagia and constipation  HPI  Very nice 71 year old white woman with a history of esophageal dysphagia in the past, PPI when taking regularly relieves this. She was seen in 2015 and had an EGD recommended but decided not to do it. She also suffers from chronic constipation, negative colonoscopy 2011, with a slow transit history and no difficulty with defecation when she finally gets the urge to stool. She may go for more than a week without defecation. She has tried Amitiza and that did not seem to help. Linzess all 3 doses has been tried but she tends to have diarrhea. She has been using the 72 g dose but when she was taking it every other day she still has some diarrhea. Quite severe and bothersome. Medications, allergies, past medical history, past surgical history, family history and social history are reviewed and updated in the  EMR.  Review of Systems As above  Objective:   Physical Exam BP 94/66 (BP Location: Left Arm, Patient Position: Sitting, Cuff Size: Normal)   Pulse 84   Ht 5' 2.5" (1.588 m) Comment: height measured without shoes Well-developed well-nourished white woman in no acute distress Eyes are anicteric Lungs sounds are clear Heart sounds are normal Abdomen is soft and nontender

## 2016-06-15 NOTE — Patient Instructions (Addendum)
  You have been scheduled for an endoscopy. Please follow written instructions given to you at your visit today. If you use inhalers (even only as needed), please bring them with you on the day of your procedure. Your physician has requested that you go to www.startemmi.com and enter the access code given to you at your visit today. This web site gives a general overview about your procedure. However, you should still follow specific instructions given to you by our office regarding your preparation for the procedure.  Use your Linzess as needed.  I appreciate the opportunity to care for you. Silvano Rusk, MD, [facility]

## 2016-06-21 ENCOUNTER — Telehealth: Payer: Self-pay

## 2016-06-21 ENCOUNTER — Ambulatory Visit
Admission: RE | Admit: 2016-06-21 | Discharge: 2016-06-21 | Disposition: A | Discharge status: Home / Self Care | Payer: Medicare Other | Source: Ambulatory Visit | Attending: Neurology | Admitting: Neurology

## 2016-06-21 DIAGNOSIS — H532 Diplopia: Secondary | ICD-10-CM

## 2016-06-21 NOTE — Telephone Encounter (Signed)
Called patient. Gave MRI results. Patient verbalized understanding.  

## 2016-06-21 NOTE — Telephone Encounter (Signed)
-----   Message from Pieter Partridge, DO sent at 06/21/2016 11:45 AM EDT ----- MRI of brain is unremarkable.  I believe her visual symptoms are likely migraine

## 2016-06-23 ENCOUNTER — Encounter: Payer: Self-pay | Admitting: Internal Medicine

## 2016-06-23 ENCOUNTER — Ambulatory Visit (INDEPENDENT_AMBULATORY_CARE_PROVIDER_SITE_OTHER): Payer: Medicare Other | Admitting: Internal Medicine

## 2016-06-23 DIAGNOSIS — G4700 Insomnia, unspecified: Secondary | ICD-10-CM | POA: Diagnosis not present

## 2016-06-23 DIAGNOSIS — G459 Transient cerebral ischemic attack, unspecified: Secondary | ICD-10-CM | POA: Diagnosis not present

## 2016-06-23 MED ORDER — DOXEPIN HCL 75 MG PO CAPS: 75.0000 mg | ORAL_CAPSULE | Freq: Every day | ORAL | 5 refills | Status: DC

## 2016-06-23 MED ORDER — ESZOPICLONE 3 MG PO TABS: 3.0000 mg | ORAL_TABLET | Freq: Every day | ORAL | 3 refills | Status: DC

## 2016-06-23 NOTE — Patient Instructions (Signed)
Valerian root for insomnia 

## 2016-06-23 NOTE — Progress Notes (Signed)
Subjective:  Patient ID: Evelyn Murphy, female    DOB: 06-07-1945  Age: 71 y.o. MRN: [id number]  CC: Insomnia (pt stated having trouble sleeping for about 3 weeks) and Hand Pain (Pt stated discoloration lt hand)   HPI Evelyn Murphy presents for insomnia  Outpatient Medications Prior to Visit  Medication Sig Dispense Refill  . aspirin EC 81 MG tablet Take 1 tablet (81 mg total) by mouth daily. 30 tablet 0  . Cholecalciferol (VITAMIN D3) 2000 UNITS capsule Take 1 capsule (2,000 Units total) by mouth daily. 100 capsule 3  . citalopram (CELEXA) 40 MG tablet TAKE ONE TABLET BY MOUTH ONE TIME DAILY 90 tablet 1  . ezetimibe (ZETIA) 10 MG tablet Take 1 tablet (10 mg total) by mouth daily. 30 tablet 9  . fluticasone furoate-vilanterol (BREO ELLIPTA) 200-25 MCG/INH AEPB Inhale 1 puff into the lungs daily. (Patient taking differently: Inhale 1 puff into the lungs as needed. ) 60 each 6  . linaclotide (LINZESS) 145 MCG CAPS capsule Take 145 mcg by mouth daily as needed.    . nortriptyline (PAMELOR) 75 MG capsule Take 1 capsule (75 mg total) by mouth at bedtime. Yearly physical due in June must see MD for refills 30 capsule 5  . pantoprazole (PROTONIX) 40 MG tablet TAKE ONE TABLET BY MOUTH ONE TIME DAILY (Patient taking differently: TAKE ONE TABLET BY MOUTH ONE TIME DAILY PRN) 90 tablet 3  . rosuvastatin (CRESTOR) 10 MG tablet     . VENTOLIN HFA 108 (90 Base) MCG/ACT inhaler     . VOLTAREN 1 % GEL Apply 2 g topically 4 (four) times daily. (Patient taking differently: Apply 2 g topically as needed. ) 1 Tube 1  . zolpidem (AMBIEN) 10 MG tablet TAKE 1 TABLET BY MOUTH AT BEDTIME 30 tablet 5   No facility-administered medications prior to visit.     ROS Review of Systems  Constitutional: Negative for activity change, appetite change, chills, fatigue and unexpected weight change.  HENT: Negative for congestion, mouth sores and sinus pressure.   Eyes: Negative for visual disturbance.  Respiratory:  Negative for cough and chest tightness.   Gastrointestinal: Negative for abdominal pain and nausea.  Genitourinary: Negative for difficulty urinating, frequency and vaginal pain.  Musculoskeletal: Negative for back pain and gait problem.  Skin: Negative for pallor and rash.  Neurological: Negative for dizziness, tremors, weakness, numbness and headaches.  Psychiatric/Behavioral: Positive for sleep disturbance. Negative for confusion, self-injury and suicidal ideas. The patient is not nervous/anxious.     Objective:  BP 124/88   Pulse 69   Temp 97.6 F (36.4 C)   Ht 5\' 3"  (1.6 m)   SpO2 98%   BP Readings from Last 3 Encounters:  06/23/16 124/88  06/15/16 94/66  05/25/16 122/70    Wt Readings from Last 3 Encounters:  05/25/16 145 lb (65.8 kg)  04/28/16 140 lb (63.5 kg)  04/13/16 145 lb (65.8 kg)    Physical Exam  Constitutional: She appears well-developed. No distress.  HENT:  Head: Normocephalic.  Right Ear: External ear normal.  Left Ear: External ear normal.  Nose: Nose normal.  Mouth/Throat: Oropharynx is clear and moist.  Eyes: Conjunctivae are normal. Pupils are equal, round, and reactive to light. Right eye exhibits no discharge. Left eye exhibits no discharge.  Neck: Normal range of motion. Neck supple. No JVD present. No tracheal deviation present. No thyromegaly present.  Cardiovascular: Normal rate, regular rhythm and normal heart sounds.   Pulmonary/Chest: No stridor.  No respiratory distress. She has no wheezes.  Abdominal: Soft. Bowel sounds are normal. She exhibits no distension and no mass. There is no tenderness. There is no rebound and no guarding.  Musculoskeletal: She exhibits no edema or tenderness.  Lymphadenopathy:    She has no cervical adenopathy.  Neurological: She displays normal reflexes. No cranial nerve deficit. She exhibits normal muscle tone. Coordination normal.  Skin: No rash noted. No erythema.  Psychiatric: She has a normal mood and  affect. Her behavior is normal. Judgment and thought content normal.    Lab Results  Component Value Date   WBC 5.6 04/03/2015   HGB 13.7 04/03/2015   HCT 41.7 04/03/2015   PLT 329.0 04/03/2015   GLUCOSE 91 01/30/2016   CHOL 183 01/30/2016   TRIG 120 01/30/2016   HDL 80 01/30/2016   LDLDIRECT 135.5 01/24/2013   LDLCALC 79 01/30/2016   ALT 43 (H) 01/30/2016   AST 36 (H) 01/30/2016   NA 140 01/30/2016   K 4.6 01/30/2016   CL 102 01/30/2016   CREATININE 0.82 01/30/2016   BUN 23 01/30/2016   CO2 28 01/30/2016   TSH 1.59 08/25/2015   INR 0.96 06/29/2014   HGBA1C 6.2 (H) 06/29/2014    Mr Brain Wo Contrast  Result Date: 06/21/2016 CLINICAL DATA:  Diplopia EXAM: MRI HEAD WITHOUT CONTRAST TECHNIQUE: Multiplanar, multiecho pulse sequences of the brain and surrounding structures were obtained without intravenous contrast. COMPARISON:  MRI head 06/29/2014 FINDINGS: Brain: Ventricle size normal. Cerebral volume normal. Negative for acute infarct. Minimal changes in the cerebral white matter appear chronic. Brainstem and basal ganglia normal. Negative for hemorrhage or mass. Vascular: Normal arterial flow void Skull and upper cervical spine: Negative Sinuses/Orbits: Negative Other: None IMPRESSION: No acute abnormality and no change from the prior study. Within normal limits for age. Electronically Signed   By: Franchot Gallo M.D.   On: 06/21/2016 11:35    Assessment & Plan:   There are no diagnoses linked to this encounter. I am having [name] maintain her aspirin EC, Vitamin D3, pantoprazole, fluticasone furoate-vilanterol, VOLTAREN, VENTOLIN HFA, ezetimibe, citalopram, rosuvastatin, nortriptyline, zolpidem, and linaclotide.  No orders of the defined types were placed in this encounter.    Follow-up: No Follow-up on file.  Walker Kehr, MD

## 2016-06-23 NOTE — Assessment & Plan Note (Signed)
Doxepine prn Lunesta prn Valerian root

## 2016-06-23 NOTE — Assessment & Plan Note (Signed)
No relapse migraine related double vision

## 2016-06-28 ENCOUNTER — Ambulatory Visit (AMBULATORY_SURGERY_CENTER): Payer: Medicare Other | Admitting: Internal Medicine

## 2016-06-28 ENCOUNTER — Encounter: Payer: Self-pay | Admitting: Internal Medicine

## 2016-06-28 VITALS — BP 109/64 | HR 65 | Temp 95.9°F | Resp 24 | Ht 62.5 in | Wt 145.0 lb

## 2016-06-28 DIAGNOSIS — K259 Gastric ulcer, unspecified as acute or chronic, without hemorrhage or perforation: Secondary | ICD-10-CM | POA: Diagnosis not present

## 2016-06-28 DIAGNOSIS — R1310 Dysphagia, unspecified: Secondary | ICD-10-CM | POA: Diagnosis present

## 2016-06-28 DIAGNOSIS — K222 Esophageal obstruction: Secondary | ICD-10-CM

## 2016-06-28 DIAGNOSIS — K2960 Other gastritis without bleeding: Secondary | ICD-10-CM

## 2016-06-28 DIAGNOSIS — Z8673 Personal history of transient ischemic attack (TIA), and cerebral infarction without residual deficits: Secondary | ICD-10-CM | POA: Diagnosis not present

## 2016-06-28 DIAGNOSIS — K2961 Other gastritis with bleeding: Secondary | ICD-10-CM | POA: Diagnosis not present

## 2016-06-28 DIAGNOSIS — K219 Gastro-esophageal reflux disease without esophagitis: Secondary | ICD-10-CM | POA: Diagnosis not present

## 2016-06-28 MED ORDER — PANTOPRAZOLE SODIUM 40 MG PO TBEC: 40.0000 mg | DELAYED_RELEASE_TABLET | Freq: Every day | ORAL | 3 refills | Status: DC

## 2016-06-28 MED ORDER — SODIUM CHLORIDE 0.9 % IV SOLN: 500.0000 mL | INTRAVENOUS | Status: DC

## 2016-06-28 NOTE — Progress Notes (Signed)
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

## 2016-06-28 NOTE — Patient Instructions (Addendum)
There were tiny ulcers in the stomach - "erosive gastritis" - I took biopsies to understand better.  I also dilated the esophagus and took biopsies of a stricture - wanted to reduce risk of future swallowing problems. I think if you stay on pantoprazole you will be ok but you must take it every day.  Aspirin could be cause of stomach eroisons but I would not stop that - if you are using BC or Goody powders, ibuprofen, aleve, etc let me know.  I appreciate the opportunity to care for you. Gatha Mayer, MD, FACG   YOU HAD AN ENDOSCOPIC PROCEDURE TODAY AT [facility]:   Refer to the procedure report that was given to you for any specific questions about what was found during the examination.  If the procedure report does not answer your questions, please call your gastroenterologist to clarify.  If you requested that your care partner not be given the details of your procedure findings, then the procedure report has been included in a sealed envelope for you to review at your convenience later.  YOU SHOULD EXPECT: Some feelings of bloating in the abdomen. Passage of more gas than usual.  Walking can help get rid of the air that was put into your GI tract during the procedure and reduce the bloating. If you had a lower endoscopy (such as a colonoscopy or flexible sigmoidoscopy) you may notice spotting of blood in your stool or on the toilet paper. If you underwent a bowel prep for your procedure, you may not have a normal bowel movement for a few days.  Please Note:  You might notice some irritation and congestion in your nose or some drainage.  This is from the oxygen used during your procedure.  There is no need for concern and it should clear up in a day or so.  SYMPTOMS TO REPORT IMMEDIATELY:     Following upper endoscopy (EGD)  Vomiting of blood or coffee ground material  New chest pain or pain under the shoulder blades  Painful or persistently difficult  swallowing  New shortness of breath  Fever of 100F or higher  Black, tarry-looking stools  For urgent or emergent issues, a gastroenterologist can be reached at any hour by calling [phone].   DIET:  FOLLOW DILATATION DIET GIVEN TO YOU TODAY. Drink plenty of fluids but you should avoid alcoholic beverages for 24 hours.  ACTIVITY:  You should plan to take it easy for the rest of today and you should NOT DRIVE or use heavy machinery until tomorrow (because of the sedation medicines used during the test).    FOLLOW UP: Our staff will call the number listed on your records the next business day following your procedure to check on you and address any questions or concerns that you may have regarding the information given to you following your procedure. If we do not reach you, we will leave a message.  However, if you are feeling well and you are not experiencing any problems, there is no need to return our call.  We will assume that you have returned to your regular daily activities without incident.  If any biopsies were taken you will be contacted by phone or by letter within the next 1-3 weeks.  Please call us at [phone] if you have not heard about the biopsies in 3 weeks.    SIGNATURES/CONFIDENTIALITY: You and/or your care partner have signed paperwork which will be entered into your electronic  medical record.  These signatures attest to the fact that that the information above on your After Visit Summary has been reviewed and is understood.  Full responsibility of the confidentiality of this discharge information lies with you and/or your care-partner.   Information on Reflux and Gastritis ,and Esophagitis and Stricture of the Esophagus given to you today.  FOLLOW DILATATION DIET GIVEN TO YOU TODAY  AWAIT BIOPSY RESULTS

## 2016-06-28 NOTE — Progress Notes (Signed)
Pt's states no medical or surgical changes since previsit or office visit. 

## 2016-06-28 NOTE — Op Note (Signed)
[facility] Patient Name: Evelyn Murphy Procedure Date: 06/28/2016 7:30 AM MRN: [id number] Endoscopist: Gatha Mayer , MD Age: 71 Referring MD:  Date of Birth: 1945/05/09 Gender: Female Account #: [id number] Procedure:                Upper GI endoscopy Indications:              Dysphagia, Heartburn Medicines:                Propofol per Anesthesia, Monitored Anesthesia Care Procedure:                Pre-Anesthesia Assessment:                           - Prior to the procedure, a History and Physical                            was performed, and patient medications and                            allergies were reviewed. The patient's tolerance of                            previous anesthesia was also reviewed. The risks                            and benefits of the procedure and the sedation                            options and risks were discussed with the patient.                            All questions were answered, and informed consent                            was obtained. Prior Anticoagulants: The patient                            last took aspirin 1 day prior to the procedure. ASA                            Grade Assessment: III - A patient with severe                            systemic disease. After reviewing the risks and                            benefits, the patient was deemed in satisfactory                            condition to undergo the procedure.                           After obtaining informed consent, the endoscope was  passed under direct vision. Throughout the                            procedure, the patient's blood pressure, pulse, and                            oxygen saturations were monitored continuously. The                            Endoscope was introduced through the mouth, and                            advanced to the second part of duodenum. The upper                            GI endoscopy was  accomplished without difficulty.                            The patient tolerated the procedure well. Scope In: Scope Out: Findings:                 One mild benign-appearing, intrinsic stenosis was                            found at the gastroesophageal junction. And was                            traversed. A TTS dilator was passed through the                            scope. Dilation with an 18-19-20 mm balloon dilator                            was performed to 20 mm. The dilation site was                            examined and showed mild improvement in luminal                            narrowing. Estimated blood loss was minimal.                            Biopsies were taken with a cold forceps for                            histology. Verification of patient identification                            for the specimen was done. Estimated blood loss was                            minimal.  A 5 cm hiatal hernia was present.                           Multiple dispersed, small bleeding erosions were                            found in the gastric antrum. There were stigmata of                            recent bleeding. Biopsies were taken with a cold                            forceps for histology. Verification of patient                            identification for the specimen was done. Estimated                            blood loss was minimal.                           The exam was otherwise without abnormality.                           The cardia and gastric fundus were normal on                            retroflexion. Complications:            No immediate complications. Estimated Blood Loss:     Estimated blood loss was minimal. Impression:               - Benign-appearing esophageal stenosis. Dilated.                            Biopsied.                           - 5 cm hiatal hernia.                           - Bleeding erosive gastropathy.  Biopsied.                           - The examination was otherwise normal. Recommendation:           - Patient has a contact number available for                            emergencies. The signs and symptoms of potential                            delayed complications were discussed with the                            patient. Return to normal activities tomorrow.  Written discharge instructions were provided to the                            patient.                           - Clear liquids x 1 hour then soft foods rest of                            day. Start prior diet tomorrow.                           - Continue present medications.                           - Await pathology results.                           - Take PPI every day continuously                           ASA should be ok pending bx results - apparently                            uses NSAID - ibuprofen but not often - had been                            using PPI prn Gatha Mayer, MD 06/28/2016 9:20:32 AM This report has been signed electronically.

## 2016-06-28 NOTE — Progress Notes (Signed)
Called to room to assist during endoscopic procedure.  Patient ID and intended procedure confirmed with present staff. Received instructions for my participation in the procedure from the performing physician.  

## 2016-06-28 NOTE — Progress Notes (Signed)
Report given to PACU, vss 

## 2016-06-29 ENCOUNTER — Telehealth: Payer: Self-pay | Admitting: *Deleted

## 2016-06-29 NOTE — Telephone Encounter (Signed)
No answer left message and will attempt to call back later this afternoon. SM

## 2016-07-05 ENCOUNTER — Encounter: Payer: Medicare Other | Admitting: Internal Medicine

## 2016-07-07 ENCOUNTER — Encounter: Payer: Self-pay | Admitting: Internal Medicine

## 2016-07-07 NOTE — Progress Notes (Signed)
bxs inflammation My Chart letter  No recall

## 2016-07-08 DIAGNOSIS — H52223 Regular astigmatism, bilateral: Secondary | ICD-10-CM | POA: Diagnosis not present

## 2016-07-08 DIAGNOSIS — D3131 Benign neoplasm of right choroid: Secondary | ICD-10-CM | POA: Diagnosis not present

## 2016-07-08 DIAGNOSIS — H2513 Age-related nuclear cataract, bilateral: Secondary | ICD-10-CM | POA: Diagnosis not present

## 2016-07-08 DIAGNOSIS — H5212 Myopia, left eye: Secondary | ICD-10-CM | POA: Diagnosis not present

## 2016-07-08 DIAGNOSIS — H524 Presbyopia: Secondary | ICD-10-CM | POA: Diagnosis not present

## 2016-08-20 ENCOUNTER — Other Ambulatory Visit: Payer: Self-pay | Admitting: Cardiovascular Disease

## 2016-08-26 ENCOUNTER — Encounter: Payer: Self-pay | Admitting: Internal Medicine

## 2016-08-26 ENCOUNTER — Ambulatory Visit (INDEPENDENT_AMBULATORY_CARE_PROVIDER_SITE_OTHER): Payer: Medicare Other | Admitting: Internal Medicine

## 2016-08-26 DIAGNOSIS — F5101 Primary insomnia: Secondary | ICD-10-CM

## 2016-08-26 DIAGNOSIS — F418 Other specified anxiety disorders: Secondary | ICD-10-CM | POA: Diagnosis not present

## 2016-08-26 DIAGNOSIS — E782 Mixed hyperlipidemia: Secondary | ICD-10-CM

## 2016-08-26 MED ORDER — ZOSTER VAC RECOMB ADJUVANTED 50 MCG/0.5ML IM SUSR: 0.5000 mL | Freq: Once | INTRAMUSCULAR | 1 refills | Status: AC

## 2016-08-26 MED ORDER — ZOLPIDEM TARTRATE 10 MG PO TABS: 5.0000 mg | ORAL_TABLET | Freq: Every evening | ORAL | 1 refills | Status: DC | PRN

## 2016-08-26 NOTE — Assessment & Plan Note (Signed)
Citalopram 

## 2016-08-26 NOTE — Assessment & Plan Note (Signed)
Re-start Ambien due to cost Cont w/[facility]

## 2016-08-26 NOTE — Progress Notes (Signed)
Subjective:  Patient ID: Evelyn Murphy, female    DOB: 1945-04-20  Age: 71 y.o. MRN: [id number]  CC: No chief complaint on file.   HPI Evelyn Murphy presents for insomnia, dyslipidemia, GERD f/u C/o Eszpsilone too expensive    ROS Review of Systems  Constitutional: Negative for activity change, appetite change, chills, fatigue and unexpected weight change.  HENT: Negative for congestion, mouth sores and sinus pressure.   Eyes: Negative for visual disturbance.  Respiratory: Negative for cough and chest tightness.   Gastrointestinal: Negative for abdominal pain and nausea.  Genitourinary: Negative for difficulty urinating, frequency and vaginal pain.  Musculoskeletal: Negative for back pain and gait problem.  Skin: Negative for pallor and rash.  Neurological: Negative for dizziness, tremors, weakness, numbness and headaches.  Psychiatric/Behavioral: Positive for sleep disturbance. Negative for confusion.    Objective:  BP 118/72 (BP Location: Left Arm, Patient Position: Sitting, Cuff Size: Normal)   Pulse 81   Temp 97.6 F (36.4 C) (Oral)   Ht 5' 2.5" (1.588 m)   SpO2 98%   BP Readings from Last 3 Encounters:  08/26/16 118/72  06/28/16 109/64  06/23/16 124/88    Wt Readings from Last 3 Encounters:  06/28/16 145 lb (65.8 kg)  05/25/16 145 lb (65.8 kg)  04/28/16 140 lb (63.5 kg)    Physical Exam  Constitutional: She appears well-developed. No distress.  HENT:  Head: Normocephalic.  Right Ear: External ear normal.  Left Ear: External ear normal.  Nose: Nose normal.  Mouth/Throat: Oropharynx is clear and moist.  Eyes: Conjunctivae are normal. Pupils are equal, round, and reactive to light. Right eye exhibits no discharge. Left eye exhibits no discharge.  Neck: Normal range of motion. Neck supple. No JVD present. No tracheal deviation present. No thyromegaly present.  Cardiovascular: Normal rate, regular rhythm and normal heart sounds.   Pulmonary/Chest: No  stridor. No respiratory distress. She has no wheezes.  Abdominal: Soft. Bowel sounds are normal. She exhibits no distension and no mass. There is no tenderness. There is no rebound and no guarding.  Musculoskeletal: She exhibits no edema or tenderness.  Lymphadenopathy:    She has no cervical adenopathy.  Neurological: She displays normal reflexes. No cranial nerve deficit. She exhibits normal muscle tone. Coordination normal.  Skin: No rash noted. No erythema.  Psychiatric: She has a normal mood and affect. Her behavior is normal. Judgment and thought content normal.    Lab Results  Component Value Date   WBC 5.6 04/03/2015   HGB 13.7 04/03/2015   HCT 41.7 04/03/2015   PLT 329.0 04/03/2015   GLUCOSE 91 01/30/2016   CHOL 183 01/30/2016   TRIG 120 01/30/2016   HDL 80 01/30/2016   LDLDIRECT 135.5 01/24/2013   LDLCALC 79 01/30/2016   ALT 43 (H) 01/30/2016   AST 36 (H) 01/30/2016   NA 140 01/30/2016   K 4.6 01/30/2016   CL 102 01/30/2016   CREATININE 0.82 01/30/2016   BUN 23 01/30/2016   CO2 28 01/30/2016   TSH 1.59 08/25/2015   INR 0.96 06/29/2014   HGBA1C 6.2 (H) 06/29/2014    Mr Brain Wo Contrast  Result Date: 06/21/2016 CLINICAL DATA:  Diplopia EXAM: MRI HEAD WITHOUT CONTRAST TECHNIQUE: Multiplanar, multiecho pulse sequences of the brain and surrounding structures were obtained without intravenous contrast. COMPARISON:  MRI head 06/29/2014 FINDINGS: Brain: Ventricle size normal. Cerebral volume normal. Negative for acute infarct. Minimal changes in the cerebral white matter appear chronic. Brainstem and basal ganglia normal.  Negative for hemorrhage or mass. Vascular: Normal arterial flow void Skull and upper cervical spine: Negative Sinuses/Orbits: Negative Other: None IMPRESSION: No acute abnormality and no change from the prior study. Within normal limits for age. Electronically Signed   By: Franchot Gallo M.D.   On: 06/21/2016 11:35    Assessment & Plan:   There are no  diagnoses linked to this encounter. I am having [name] maintain her aspirin EC, Vitamin D3, fluticasone furoate-vilanterol, VOLTAREN, VENTOLIN HFA, ezetimibe, citalopram, Eszopiclone, doxepin, UNABLE TO FIND, pantoprazole, and rosuvastatin. We will continue to administer sodium chloride.  No orders of the defined types were placed in this encounter.    Follow-up: No Follow-up on file.  Walker Kehr, MD

## 2016-08-26 NOTE — Patient Instructions (Signed)
[facility] w/Jill 

## 2016-08-26 NOTE — Assessment & Plan Note (Signed)
Labs

## 2016-08-27 ENCOUNTER — Other Ambulatory Visit (INDEPENDENT_AMBULATORY_CARE_PROVIDER_SITE_OTHER): Payer: Medicare Other

## 2016-08-27 DIAGNOSIS — E782 Mixed hyperlipidemia: Secondary | ICD-10-CM | POA: Diagnosis not present

## 2016-08-27 LAB — BASIC METABOLIC PANEL
BUN: 22 mg/dL (ref 6–23)
CHLORIDE: 107 meq/L (ref 96–112)
CO2: 29 meq/L (ref 19–32)
Calcium: 9.9 mg/dL (ref 8.4–10.5)
Creatinine, Ser: 0.81 mg/dL (ref 0.40–1.20)
GFR: 74.51 mL/min (ref >60.00)
Glucose, Bld: 107 mg/dL — ABNORMAL HIGH (ref 70–99)
POTASSIUM: 4.1 meq/L (ref 3.5–5.1)
Sodium: 142 mEq/L (ref 135–145)

## 2016-08-27 LAB — HEPATIC FUNCTION PANEL
ALBUMIN: 4.3 g/dL (ref 3.5–5.2)
ALK PHOS: 70 U/L (ref 39–117)
ALT: 20 U/L (ref 0–35)
AST: 19 U/L (ref 0–37)
Bilirubin, Direct: 0.1 mg/dL (ref 0.0–0.3)
TOTAL PROTEIN: 7 g/dL (ref 6.0–8.3)
Total Bilirubin: 0.5 mg/dL (ref 0.2–1.2)

## 2016-08-27 LAB — LIPID PANEL
CHOL/HDL RATIO: 2
Cholesterol: 136 mg/dL (ref 0–200)
HDL: 58.8 mg/dL (ref >39.00)
LDL Cholesterol: 65 mg/dL (ref 0–99)
NonHDL: 77.31
TRIGLYCERIDES: 61 mg/dL (ref 0.0–149.0)
VLDL: 12.2 mg/dL (ref 0.0–40.0)

## 2016-09-30 DIAGNOSIS — Z1231 Encounter for screening mammogram for malignant neoplasm of breast: Secondary | ICD-10-CM | POA: Diagnosis not present

## 2016-09-30 DIAGNOSIS — Z01419 Encounter for gynecological examination (general) (routine) without abnormal findings: Secondary | ICD-10-CM | POA: Diagnosis not present

## 2016-10-08 DIAGNOSIS — R040 Epistaxis: Secondary | ICD-10-CM | POA: Diagnosis not present

## 2016-10-08 DIAGNOSIS — H6123 Impacted cerumen, bilateral: Secondary | ICD-10-CM | POA: Diagnosis not present

## 2016-11-17 ENCOUNTER — Ambulatory Visit (INDEPENDENT_AMBULATORY_CARE_PROVIDER_SITE_OTHER)
Admission: RE | Admit: 2016-11-17 | Discharge: 2016-11-17 | Disposition: A | Discharge status: Home / Self Care | Payer: Medicare Other | Source: Ambulatory Visit | Attending: Internal Medicine | Admitting: Internal Medicine

## 2016-11-17 ENCOUNTER — Ambulatory Visit (INDEPENDENT_AMBULATORY_CARE_PROVIDER_SITE_OTHER): Payer: Medicare Other | Admitting: Internal Medicine

## 2016-11-17 ENCOUNTER — Encounter: Payer: Self-pay | Admitting: Internal Medicine

## 2016-11-17 VITALS — BP 114/68 | HR 80 | Temp 98.4°F | Ht 62.5 in

## 2016-11-17 DIAGNOSIS — Z23 Encounter for immunization: Secondary | ICD-10-CM | POA: Diagnosis not present

## 2016-11-17 DIAGNOSIS — M79642 Pain in left hand: Secondary | ICD-10-CM

## 2016-11-17 DIAGNOSIS — M79643 Pain in unspecified hand: Secondary | ICD-10-CM | POA: Insufficient documentation

## 2016-11-17 NOTE — Assessment & Plan Note (Signed)
L hand pain - ulnar proximal and wrist - tender w/deep palpation X ray NSAID Padded gloves Sports Med/[facility] [place] suggested

## 2016-11-17 NOTE — Progress Notes (Signed)
   Subjective:  Patient ID: Evelyn Murphy, female    DOB: 06-Mar-1946  Age: 71 y.o. MRN: [id number]  CC: No chief complaint on file.   HPI TEYANNA THIELMAN presents for L hand pain - ulnar proximal w/certain movements O75 d. No injury    ROS Review of Systems  Constitutional: Negative for fever.  Neurological: Positive for weakness and numbness.    Objective:  BP 114/68 (BP Location: Left Arm, Patient Position: Sitting, Cuff Size: Normal)   Pulse 80   Temp 98.4 F (36.9 C) (Oral)   Ht 5' 2.5" (1.588 m)   SpO2 99%   BP Readings from Last 3 Encounters:  11/17/16 114/68  08/26/16 118/72  06/28/16 109/64    Wt Readings from Last 3 Encounters:  06/28/16 145 lb (65.8 kg)  05/25/16 145 lb (65.8 kg)  04/28/16 140 lb (63.5 kg)    Physical Exam  Musculoskeletal: She exhibits tenderness. She exhibits no edema or deformity.  Neurological: She displays normal reflexes. Coordination normal.  Skin: No rash noted. No erythema. No pallor.  Psychiatric: She has a normal mood and affect.    L hand pain - ulnar proximal and wrist - tender w/deep palpation  Lab Results  Component Value Date   WBC 5.6 04/03/2015   HGB 13.7 04/03/2015   HCT 41.7 04/03/2015   PLT 329.0 04/03/2015   GLUCOSE 107 (H) 08/27/2016   CHOL 136 08/27/2016   TRIG 61.0 08/27/2016   HDL 58.80 08/27/2016   LDLDIRECT 135.5 01/24/2013   LDLCALC 65 08/27/2016   ALT 20 08/27/2016   AST 19 08/27/2016   NA 142 08/27/2016   K 4.1 08/27/2016   CL 107 08/27/2016   CREATININE 0.81 08/27/2016   BUN 22 08/27/2016   CO2 29 08/27/2016   TSH 1.59 08/25/2015   INR 0.96 06/29/2014   HGBA1C 6.2 (H) 06/29/2014    Mr Brain Wo Contrast  Result Date: 06/21/2016 CLINICAL DATA:  Diplopia EXAM: MRI HEAD WITHOUT CONTRAST TECHNIQUE: Multiplanar, multiecho pulse sequences of the brain and surrounding structures were obtained without intravenous contrast. COMPARISON:  MRI head 06/29/2014 FINDINGS: Brain: Ventricle size normal.  Cerebral volume normal. Negative for acute infarct. Minimal changes in the cerebral white matter appear chronic. Brainstem and basal ganglia normal. Negative for hemorrhage or mass. Vascular: Normal arterial flow void Skull and upper cervical spine: Negative Sinuses/Orbits: Negative Other: None IMPRESSION: No acute abnormality and no change from the prior study. Within normal limits for age. Electronically Signed   By: Franchot Gallo M.D.   On: 06/21/2016 11:35    Assessment & Plan:   There are no diagnoses linked to this encounter. I am having [name] maintain her aspirin EC, Vitamin D3, fluticasone furoate-vilanterol, VOLTAREN, VENTOLIN HFA, ezetimibe, citalopram, doxepin, UNABLE TO FIND, pantoprazole, rosuvastatin, and zolpidem. We will continue to administer sodium chloride.  No orders of the defined types were placed in this encounter.    Follow-up: No Follow-up on file.  Walker Kehr, MD

## 2016-11-17 NOTE — Addendum Note (Signed)
Addended by: Karren Cobble on: 11/17/2016 04:00 PM   Modules accepted: Orders

## 2016-11-30 ENCOUNTER — Ambulatory Visit (INDEPENDENT_AMBULATORY_CARE_PROVIDER_SITE_OTHER): Payer: Medicare Other | Admitting: Adult Health

## 2016-11-30 ENCOUNTER — Ambulatory Visit (INDEPENDENT_AMBULATORY_CARE_PROVIDER_SITE_OTHER)
Admission: RE | Admit: 2016-11-30 | Discharge: 2016-11-30 | Disposition: A | Discharge status: Home / Self Care | Payer: Medicare Other | Source: Ambulatory Visit | Attending: Adult Health | Admitting: Adult Health

## 2016-11-30 ENCOUNTER — Telehealth: Payer: Self-pay | Admitting: Pulmonary Disease

## 2016-11-30 ENCOUNTER — Ambulatory Visit: Payer: Medicare Other | Admitting: Pulmonary Disease

## 2016-11-30 ENCOUNTER — Encounter: Payer: Self-pay | Admitting: Adult Health

## 2016-11-30 VITALS — BP 106/64 | HR 83

## 2016-11-30 DIAGNOSIS — J4530 Mild persistent asthma, uncomplicated: Secondary | ICD-10-CM

## 2016-11-30 DIAGNOSIS — R05 Cough: Secondary | ICD-10-CM | POA: Diagnosis not present

## 2016-11-30 LAB — NITRIC OXIDE: Nitric Oxide: 54

## 2016-11-30 MED ORDER — FLUTICASONE FUROATE-VILANTEROL 200-25 MCG/INH IN AEPB: 1.0000 | INHALATION_SPRAY | Freq: Every day | RESPIRATORY_TRACT | 0 refills | Status: DC

## 2016-11-30 MED ORDER — PREDNISONE 10 MG PO TABS: ORAL_TABLET | ORAL | 0 refills | Status: DC

## 2016-11-30 MED ORDER — LEVALBUTEROL HCL 0.63 MG/3ML IN NEBU
0.6300 mg | INHALATION_SOLUTION | Freq: Once | RESPIRATORY_TRACT | Status: AC
  Administered 2016-11-30: 0.63 mg via RESPIRATORY_TRACT

## 2016-11-30 NOTE — Telephone Encounter (Signed)
VS can we double book your schedule? Thanks.

## 2016-11-30 NOTE — Patient Instructions (Addendum)
Restart BREO 1 puff daily .  Prednisone taper over next week, to have on hold if symptoms worsen/persist with wheezing  Use Albuterol 2 puffs every 4hrs as needed for wheezing -this is your rescue inhaler.  Restart Protonix 40mg  daily .  GERD diet  Chest xray today  Follow up with Dr. Halford Chessman  In 2 months and As needed   Please contact office for sooner follow up if symptoms do not improve or worsen or seek emergency care

## 2016-11-30 NOTE — Progress Notes (Signed)
@Patient  ID: Evelyn Murphy, female    DOB: 25-Feb-1946, 71 y.o.   MRN: [id number]  Chief Complaint  Patient presents with  . Acute Visit    Asthma    Referring provider: PlotnikovEvie Lacks, MD  HPI: 71 yo female never smoker  followed for asthma and cough  Initial pulmonary consult  01/16/15 with Dr. Halford Chessman  .   TESTS: Echo 06/30/14 >> EF 55 to 12%, grade 1 diastolic dysfx PFT 75/17/00 >> FEV1 1.88 (81%), FEV1% 86, TLC 4.29 (86%), DLCO 72% FeNO 12/17/15 >> 75  11/30/2016 Acute OV : Asthma  Patient presents for an acute office visit. She complains of 2 weeks of  Wheezing and  shortness of breath. No fever, cough, congestion or discolored mucus .  She is on  BREO but uses As needed  Only used it 2 times in last week. . Says it is not covered by insurance .  No Albuterol use.  Does have intermittent reflux. She uses Protonix As needed.  No dysphagia . No chest pain or exertional chest pain/jaw or shoulder pain.  FENO today is 54 .   Exercises daily , treadmill and stairmaster, stationary bike.  Allergies  Allergen Reactions  . Linzess [Linaclotide]     Too strong  . Lipitor [Atorvastatin]     elev LFTs    Immunization History  Administered Date(s) Administered  . Influenza Whole 12/26/2011  . Influenza, High Dose Seasonal PF 11/17/2016  . Influenza,inj,Quad PF,6+ Mos 02/09/2015, 11/10/2015  . Influenza-Unspecified 12/24/2012, 12/04/2013  . Pneumococcal Conjugate-13 01/24/2013  . Pneumococcal Polysaccharide-23 10/03/2014  . Td 10/10/2009    Past Medical History:  Diagnosis Date  . Anxiety   . Asthma   . Confusion 2016   admitted - ? TIA - NOT - was overmedication issue  . Depression   . Hyperlipidemia   . Insomnia   . RBBB     Tobacco History: History  Smoking Status  . Never Smoker  Smokeless Tobacco  . Never Used   Counseling given: Not Answered   Outpatient Encounter Prescriptions as of 11/30/2016  Medication Sig  . aspirin EC 81 MG tablet Take 1  tablet (81 mg total) by mouth daily.  . Cholecalciferol (VITAMIN D3) 2000 UNITS capsule Take 1 capsule (2,000 Units total) by mouth daily.  . citalopram (CELEXA) 40 MG tablet TAKE ONE TABLET BY MOUTH ONE TIME DAILY  . doxepin (SINEQUAN) 75 MG capsule Take 1-2 capsules (75-150 mg total) by mouth at bedtime.  Marland Kitchen ezetimibe (ZETIA) 10 MG tablet Take 1 tablet (10 mg total) by mouth daily.  . fluticasone furoate-vilanterol (BREO ELLIPTA) 200-25 MCG/INH AEPB Inhale 1 puff into the lungs daily. (Patient taking differently: Inhale 1 puff into the lungs as needed. )  . pantoprazole (PROTONIX) 40 MG tablet Take 1 tablet (40 mg total) by mouth daily before breakfast.  . UNABLE TO FIND Med Name: Valerian Root 2 per day  . VENTOLIN HFA 108 (90 Base) MCG/ACT inhaler Inhale 2 puffs into the lungs every 6 (six) hours as needed.   . VOLTAREN 1 % GEL Apply 2 g topically 4 (four) times daily. (Patient taking differently: Apply 2 g topically as needed. )  . fluticasone furoate-vilanterol (BREO ELLIPTA) 200-25 MCG/INH AEPB Inhale 1 puff into the lungs daily.  . predniSONE (DELTASONE) 10 MG tablet 4 tabs for 2 days, then 3 tabs for 2 days, 2 tabs for 2 days, then 1 tab for 2 days, then stop  . zolpidem (AMBIEN)  10 MG tablet Take 0.5-1 tablets (5-10 mg total) by mouth at bedtime as needed for sleep.  . [DISCONTINUED] rosuvastatin (CRESTOR) 10 MG tablet TAKE 1 TABLET BY MOUTH DAILY. (Patient not taking: Reported on 11/30/2016)   Facility-Administered Encounter Medications as of 11/30/2016  Medication  . 0.9 %  sodium chloride infusion     Review of Systems  Constitutional:   No  weight loss, night sweats,  Fevers, chills, fatigue, or  lassitude.  HEENT:   No headaches,  Difficulty swallowing,  Tooth/dental problems, or  Sore throat,                No sneezing, itching, ear ache, nasal congestion, post nasal drip,   CV:  No chest pain,  Orthopnea, PND, swelling in lower extremities, anasarca, dizziness, palpitations,  syncope.   GI  No heartburn, indigestion, abdominal pain, nausea, vomiting, diarrhea, change in bowel habits, loss of appetite, bloody stools.   Resp:  No chest wall deformity  Skin: no rash or lesions.  GU: no dysuria, change in color of urine, no urgency or frequency.  No flank pain, no hematuria   MS:  No joint pain or swelling.  No decreased range of motion.  No back pain.    Physical Exam  BP 106/64 (BP Location: Right Arm, Cuff Size: Normal)   Pulse 83   SpO2 98%   GEN: A/Ox3; pleasant , NAD, well nourished    HEENT:  [place]/AT,  EACs-clear, TMs-wnl, NOSE-clear, THROAT-clear, no lesions, no postnasal drip or exudate noted.   NECK:  Supple w/ fair ROM; no JVD; normal carotid impulses w/o bruits; no thyromegaly or nodules palpated; no lymphadenopathy.    RESP  Clear  P & A; w/o, wheezes/ rales/ or rhonchi. no accessory muscle use, no dullness to percussion  CARD:  RRR, no m/r/g, no peripheral edema, pulses intact, no cyanosis or clubbing.  GI:   Soft & nt; nml bowel sounds; no organomegaly or masses detected.   Musco: Warm bil, no deformities or joint swelling noted.   Neuro: alert, no focal deficits noted.    Skin: Warm, no lesions or rashes    Lab Results:  CBC    Component Value Date/Time   WBC 5.6 04/03/2015 1020   RBC 4.77 04/03/2015 1020   HGB 13.7 04/03/2015 1020   HCT 41.7 04/03/2015 1020   PLT 329.0 04/03/2015 1020   MCV 87.4 04/03/2015 1020   MCH 28.4 07/01/2014 0417   MCHC 32.8 04/03/2015 1020   RDW 12.5 04/03/2015 1020   LYMPHSABS 1.9 04/03/2015 1020   MONOABS 0.4 04/03/2015 1020   EOSABS 0.3 04/03/2015 1020   BASOSABS 0.0 04/03/2015 1020    BMET    Component Value Date/Time   NA 142 08/27/2016 1153   K 4.1 08/27/2016 1153   CL 107 08/27/2016 1153   CO2 29 08/27/2016 1153   GLUCOSE 107 (H) 08/27/2016 1153   BUN 22 08/27/2016 1153   CREATININE 0.81 08/27/2016 1153   CREATININE 0.82 01/30/2016 1015   CALCIUM 9.9 08/27/2016 1153    GFRNONAA 72 (L) 07/01/2014 0417   GFRAA 83 (L) 07/01/2014 0417    BNP No results found for: BNP  ProBNP No results found for: PROBNP  Imaging: Dg Hand Complete Left  Result Date: 11/17/2016 CLINICAL DATA:  Left hand pain. EXAM: LEFT HAND - COMPLETE 3+ VIEW COMPARISON:  10/06/2004. FINDINGS: No acute bony or joint abnormality identified. No evidence of acute fracture or dislocation. Diffuse mild degenerative change. IMPRESSION: Diffuse  mild degenerative change.  No acute abnormality. Electronically Signed   By: Marcello Moores  Register   On: 11/17/2016 12:31     Assessment & Plan:   No problem-specific Assessment & Plan notes found for this encounter.     Rexene Edison, NP 11/30/2016

## 2016-12-01 NOTE — Progress Notes (Signed)
I have reviewed and agree with assessment/plan.  Chesley Mires, MD [facility] Pulmonary/Critical Care 12/01/2016, 10:16 AM Pager:  [phone]

## 2016-12-01 NOTE — Telephone Encounter (Signed)
Okay to double book visit with me. 

## 2016-12-01 NOTE — Telephone Encounter (Signed)
According to the schedule, he can be double booked on Nov 29th early morning or afternoon would be the best day according to schedule. I called pt to see if this would work for her. Left message on vm to call back. If she calls back please schedule. Thanks.

## 2016-12-01 NOTE — Telephone Encounter (Signed)
Patient has been scheduled with VS for 11/29 at 1030.

## 2016-12-01 NOTE — Progress Notes (Signed)
Called spoke with patient informed her of results and recommendation per TP. Patient verbalized understanding and denied any questions or concerns at this time.

## 2016-12-01 NOTE — Telephone Encounter (Signed)
Pt returning call.Evelyn Murphy ° °

## 2016-12-25 ENCOUNTER — Other Ambulatory Visit: Payer: Self-pay | Admitting: Internal Medicine

## 2016-12-26 ENCOUNTER — Other Ambulatory Visit: Payer: Self-pay | Admitting: Cardiovascular Disease

## 2016-12-27 ENCOUNTER — Ambulatory Visit: Payer: Medicare Other | Admitting: Internal Medicine

## 2016-12-28 ENCOUNTER — Telehealth: Payer: Self-pay | Admitting: Internal Medicine

## 2016-12-28 NOTE — Telephone Encounter (Signed)
Evelyn Murphy pt states she is taking linzess 166mcg daily for constipation. Reports she has not passed any stool in 10 days. Pt wants to know what she can do or take. Dr. Ardis Hughs please advise as doc of the day.

## 2016-12-28 NOTE — Telephone Encounter (Signed)
Miralax/gatorade purge today, then resume linzess at usual dose with addition of a single miralax dose every day as well.  Thanks

## 2016-12-28 NOTE — Telephone Encounter (Signed)
Reviewed Miralax purge instructions with the patient.  All questions answered. She will call back for additional questions or concerns

## 2016-12-28 NOTE — Telephone Encounter (Signed)
Left message for patient to call back  

## 2016-12-28 NOTE — Telephone Encounter (Signed)
Spoke with pt and she is aware of Dr. Ardis Hughs recommendations.

## 2017-01-03 ENCOUNTER — Ambulatory Visit: Payer: Medicare Other | Admitting: Internal Medicine

## 2017-01-07 ENCOUNTER — Other Ambulatory Visit: Payer: Self-pay | Admitting: Internal Medicine

## 2017-01-10 ENCOUNTER — Other Ambulatory Visit: Payer: Self-pay | Admitting: *Deleted

## 2017-01-10 DIAGNOSIS — I6523 Occlusion and stenosis of bilateral carotid arteries: Secondary | ICD-10-CM

## 2017-01-11 NOTE — Telephone Encounter (Signed)
Faxed to [facility]

## 2017-01-13 ENCOUNTER — Ambulatory Visit: Payer: Medicare Other | Admitting: Internal Medicine

## 2017-01-17 ENCOUNTER — Encounter: Payer: Self-pay | Admitting: Internal Medicine

## 2017-01-17 ENCOUNTER — Other Ambulatory Visit (INDEPENDENT_AMBULATORY_CARE_PROVIDER_SITE_OTHER): Payer: Medicare Other

## 2017-01-17 ENCOUNTER — Ambulatory Visit (INDEPENDENT_AMBULATORY_CARE_PROVIDER_SITE_OTHER): Payer: Medicare Other | Admitting: Internal Medicine

## 2017-01-17 VITALS — BP 112/70 | HR 75 | Temp 98.1°F | Ht 62.5 in

## 2017-01-17 DIAGNOSIS — E785 Hyperlipidemia, unspecified: Secondary | ICD-10-CM

## 2017-01-17 DIAGNOSIS — J4530 Mild persistent asthma, uncomplicated: Secondary | ICD-10-CM

## 2017-01-17 DIAGNOSIS — M545 Low back pain: Secondary | ICD-10-CM

## 2017-01-17 DIAGNOSIS — F418 Other specified anxiety disorders: Secondary | ICD-10-CM | POA: Diagnosis not present

## 2017-01-17 DIAGNOSIS — R7309 Other abnormal glucose: Secondary | ICD-10-CM

## 2017-01-17 DIAGNOSIS — G8929 Other chronic pain: Secondary | ICD-10-CM

## 2017-01-17 DIAGNOSIS — M549 Dorsalgia, unspecified: Secondary | ICD-10-CM | POA: Insufficient documentation

## 2017-01-17 DIAGNOSIS — F5101 Primary insomnia: Secondary | ICD-10-CM

## 2017-01-17 DIAGNOSIS — I6523 Occlusion and stenosis of bilateral carotid arteries: Secondary | ICD-10-CM

## 2017-01-17 LAB — BASIC METABOLIC PANEL
BUN: 27 mg/dL — ABNORMAL HIGH (ref 6–23)
CALCIUM: 10 mg/dL (ref 8.4–10.5)
CHLORIDE: 103 meq/L (ref 96–112)
CO2: 29 meq/L (ref 19–32)
Creatinine, Ser: 0.8 mg/dL (ref 0.40–1.20)
GFR: 75.5 mL/min (ref >60.00)
GLUCOSE: 104 mg/dL — AB (ref 70–99)
Potassium: 4 mEq/L (ref 3.5–5.1)
SODIUM: 141 meq/L (ref 135–145)

## 2017-01-17 LAB — LIPID PANEL
CHOL/HDL RATIO: 4
Cholesterol: 285 mg/dL — ABNORMAL HIGH (ref 0–200)
HDL: 71.4 mg/dL (ref >39.00)
LDL CALC: 189 mg/dL — AB (ref 0–99)
NONHDL: 214.06
Triglycerides: 125 mg/dL (ref 0.0–149.0)
VLDL: 25 mg/dL (ref 0.0–40.0)

## 2017-01-17 MED ORDER — DOXEPIN HCL 75 MG PO CAPS: 75.0000 mg | ORAL_CAPSULE | Freq: Every day | ORAL | 1 refills | Status: DC

## 2017-01-17 MED ORDER — ZOSTER VAC RECOMB ADJUVANTED 50 MCG/0.5ML IM SUSR: 0.5000 mL | Freq: Once | INTRAMUSCULAR | 1 refills | Status: AC

## 2017-01-17 MED ORDER — ZOSTER VAC RECOMB ADJUVANTED 50 MCG/0.5ML IM SUSR: 0.5000 mL | Freq: Once | INTRAMUSCULAR | 1 refills | Status: DC

## 2017-01-17 NOTE — Assessment & Plan Note (Signed)
Citalopram helps Valerian root

## 2017-01-17 NOTE — Assessment & Plan Note (Signed)
On Breo 

## 2017-01-17 NOTE — Patient Instructions (Addendum)
McKenzie back pillow MC well yearly

## 2017-01-17 NOTE — Assessment & Plan Note (Signed)
Sinequan

## 2017-01-17 NOTE — Progress Notes (Signed)
Subjective:  Patient ID: Evelyn Murphy, female    DOB: 1945/12/23  Age: 71 y.o. MRN: [id number]  CC: No chief complaint on file.   HPI Evelyn Murphy presents for asthma, dyslipidemia, depression f/u. Asthma is better. Valerian root helps. C/o pain in upper lumbar back - chronic  Outpatient Medications Prior to Visit  Medication Sig Dispense Refill  . aspirin EC 81 MG tablet Take 1 tablet (81 mg total) by mouth daily. 30 tablet 0  . Cholecalciferol (VITAMIN D3) 2000 UNITS capsule Take 1 capsule (2,000 Units total) by mouth daily. 100 capsule 3  . citalopram (CELEXA) 40 MG tablet Take 1 tablet (40 mg total) by mouth daily. 90 tablet 1  . doxepin (SINEQUAN) 75 MG capsule Take 1-2 capsules (75-150 mg total) by mouth at bedtime. 60 capsule 5  . ezetimibe (ZETIA) 10 MG tablet TAKE 1 TABLET (10 MG TOTAL) BY MOUTH DAILY. 30 tablet 1  . fluticasone furoate-vilanterol (BREO ELLIPTA) 200-25 MCG/INH AEPB Inhale 1 puff into the lungs daily. (Patient taking differently: Inhale 1 puff into the lungs as needed. ) 60 each 6  . fluticasone furoate-vilanterol (BREO ELLIPTA) 200-25 MCG/INH AEPB Inhale 1 puff into the lungs daily. 1 each 0  . pantoprazole (PROTONIX) 40 MG tablet Take 1 tablet (40 mg total) by mouth daily before breakfast. 90 tablet 3  . predniSONE (DELTASONE) 10 MG tablet 4 tabs for 2 days, then 3 tabs for 2 days, 2 tabs for 2 days, then 1 tab for 2 days, then stop 20 tablet 0  . UNABLE TO FIND Med Name: Valerian Root 2 per day    . VENTOLIN HFA 108 (90 Base) MCG/ACT inhaler Inhale 2 puffs into the lungs every 6 (six) hours as needed.     . VOLTAREN 1 % GEL Apply 2 g topically 4 (four) times daily. (Patient taking differently: Apply 2 g topically as needed. ) 1 Tube 1  . zolpidem (AMBIEN) 10 MG tablet TAKE 1 TABLET BY MOUTH AT BEDTIME 30 tablet 3   Facility-Administered Medications Prior to Visit  Medication Dose Route Frequency Provider Last Rate Last Dose  . 0.9 %  sodium chloride  infusion  500 mL Intravenous Continuous Gatha Mayer, MD        ROS Review of Systems  Constitutional: Negative for activity change, appetite change, chills, fatigue and unexpected weight change.  HENT: Negative for congestion, mouth sores and sinus pressure.   Eyes: Negative for visual disturbance.  Respiratory: Negative for cough and chest tightness.   Gastrointestinal: Negative for abdominal pain and nausea.  Genitourinary: Negative for difficulty urinating, frequency and vaginal pain.  Musculoskeletal: Negative for back pain and gait problem.  Skin: Negative for pallor and rash.  Neurological: Negative for dizziness, tremors, weakness, numbness and headaches.  Psychiatric/Behavioral: Positive for sleep disturbance. Negative for confusion.    Objective:  BP 112/70 (BP Location: Left Arm, Patient Position: Sitting, Cuff Size: Normal)   Pulse 75   Temp 98.1 F (36.7 C) (Oral)   Ht 5' 2.5" (1.588 m)   SpO2 98%   BP Readings from Last 3 Encounters:  01/17/17 112/70  11/30/16 106/64  11/17/16 114/68    Wt Readings from Last 3 Encounters:  06/28/16 145 lb (65.8 kg)  05/25/16 145 lb (65.8 kg)  04/28/16 140 lb (63.5 kg)    Physical Exam  Constitutional: She appears well-developed. No distress.  HENT:  Head: Normocephalic.  Right Ear: External ear normal.  Left Ear: External ear normal.  Nose: Nose normal.  Mouth/Throat: Oropharynx is clear and moist.  Eyes: Pupils are equal, round, and reactive to light. Conjunctivae are normal. Right eye exhibits no discharge. Left eye exhibits no discharge.  Neck: Normal range of motion. Neck supple. No JVD present. No tracheal deviation present. No thyromegaly present.  Cardiovascular: Normal rate, regular rhythm and normal heart sounds.   Pulmonary/Chest: No stridor. No respiratory distress. She has no wheezes.  Abdominal: Soft. Bowel sounds are normal. She exhibits no distension and no mass. There is no tenderness. There is no  rebound and no guarding.  Musculoskeletal: She exhibits no edema or tenderness.  Lymphadenopathy:    She has no cervical adenopathy.  Neurological: She displays normal reflexes. No cranial nerve deficit. She exhibits normal muscle tone. Coordination normal.  Skin: No rash noted. No erythema.  Psychiatric: She has a normal mood and affect. Her behavior is normal. Judgment and thought content normal.  upper LS pain w/overextending  Lab Results  Component Value Date   WBC 5.6 04/03/2015   HGB 13.7 04/03/2015   HCT 41.7 04/03/2015   PLT 329.0 04/03/2015   GLUCOSE 107 (H) 08/27/2016   CHOL 136 08/27/2016   TRIG 61.0 08/27/2016   HDL 58.80 08/27/2016   LDLDIRECT 135.5 01/24/2013   LDLCALC 65 08/27/2016   ALT 20 08/27/2016   AST 19 08/27/2016   NA 142 08/27/2016   K 4.1 08/27/2016   CL 107 08/27/2016   CREATININE 0.81 08/27/2016   BUN 22 08/27/2016   CO2 29 08/27/2016   TSH 1.59 08/25/2015   INR 0.96 06/29/2014   HGBA1C 6.2 (H) 06/29/2014    Dg Chest 2 View  Result Date: 11/30/2016 CLINICAL DATA:  Cough. EXAM: CHEST  2 VIEW COMPARISON:  01/16/2015 . FINDINGS: Mediastinum and hilar structures normal. No acute infiltrate. pleuroparenchymal thickening noted consistent scarring. No pleural effusion or pneumothorax. IMPRESSION: No acute cardiopulmonary disease. Electronically Signed   By: Marcello Moores  Register   On: 11/30/2016 11:53    Assessment & Plan:   There are no diagnoses linked to this encounter. I am having Evelyn Murphy maintain her aspirin EC, Vitamin D3, fluticasone furoate-vilanterol, VOLTAREN, VENTOLIN HFA, doxepin, UNABLE TO FIND, pantoprazole, predniSONE, fluticasone furoate-vilanterol, citalopram, ezetimibe, and zolpidem. We will continue to administer sodium chloride.  No orders of the defined types were placed in this encounter.    Follow-up: No Follow-up on file.  Walker Kehr, MD

## 2017-01-17 NOTE — Assessment & Plan Note (Signed)
Crestor - not taking Zetia

## 2017-01-17 NOTE — Assessment & Plan Note (Signed)
Upper lumbar - ?faset joints OA Stretching exercises McKenzie pillow

## 2017-01-18 ENCOUNTER — Other Ambulatory Visit: Payer: Self-pay | Admitting: Internal Medicine

## 2017-01-18 DIAGNOSIS — E785 Hyperlipidemia, unspecified: Secondary | ICD-10-CM

## 2017-01-18 MED ORDER — PRAVASTATIN SODIUM 20 MG PO TABS
20.0000 mg | ORAL_TABLET | Freq: Every day | ORAL | 3 refills | Status: DC
Start: 2017-01-18 — End: 2018-01-08

## 2017-01-25 DIAGNOSIS — D3131 Benign neoplasm of right choroid: Secondary | ICD-10-CM | POA: Diagnosis not present

## 2017-02-01 ENCOUNTER — Other Ambulatory Visit: Payer: Self-pay | Admitting: Adult Health

## 2017-02-09 ENCOUNTER — Other Ambulatory Visit: Payer: Self-pay | Admitting: Cardiovascular Disease

## 2017-02-14 ENCOUNTER — Ambulatory Visit (INDEPENDENT_AMBULATORY_CARE_PROVIDER_SITE_OTHER): Payer: Medicare Other | Admitting: Pulmonary Disease

## 2017-02-14 ENCOUNTER — Encounter: Payer: Self-pay | Admitting: Pulmonary Disease

## 2017-02-14 VITALS — BP 108/74 | HR 89 | Ht 63.0 in | Wt 140.0 lb

## 2017-02-14 DIAGNOSIS — I6523 Occlusion and stenosis of bilateral carotid arteries: Secondary | ICD-10-CM | POA: Diagnosis not present

## 2017-02-14 DIAGNOSIS — J4530 Mild persistent asthma, uncomplicated: Secondary | ICD-10-CM

## 2017-02-14 MED ORDER — FLUTICASONE FUROATE 100 MCG/ACT IN AEPB: 1.0000 | INHALATION_SPRAY | Freq: Every day | RESPIRATORY_TRACT | 5 refills | Status: DC

## 2017-02-14 NOTE — Progress Notes (Signed)
Current Outpatient Medications on File Prior to Visit  Medication Sig  . aspirin EC 81 MG tablet Take 1 tablet (81 mg total) by mouth daily.  . Cholecalciferol (VITAMIN D3) 2000 UNITS capsule Take 1 capsule (2,000 Units total) by mouth daily.  . citalopram (CELEXA) 40 MG tablet Take 1 tablet (40 mg total) by mouth daily.  Marland Kitchen doxepin (SINEQUAN) 75 MG capsule Take 1-2 capsules (75-150 mg total) by mouth at bedtime.  Marland Kitchen ezetimibe (ZETIA) 10 MG tablet Take 1 tablet (10 mg total) by mouth daily. Please keep 1-15 at 11:20 am appointment for more refills thanks.  . pantoprazole (PROTONIX) 40 MG tablet Take 1 tablet (40 mg total) by mouth daily before breakfast.  . pravastatin (PRAVACHOL) 20 MG tablet Take 1 tablet (20 mg total) by mouth daily.  Marland Kitchen UNABLE TO FIND Med Name: Valerian Root 2 per day  . VENTOLIN HFA 108 (90 Base) MCG/ACT inhaler Inhale 2 puffs into the lungs every 6 (six) hours as needed.   . VOLTAREN 1 % GEL Apply 2 g topically 4 (four) times daily. (Patient taking differently: Apply 2 g topically as needed. )  . zolpidem (AMBIEN) 10 MG tablet TAKE 1 TABLET BY MOUTH AT BEDTIME   Current Facility-Administered Medications on File Prior to Visit  Medication  . 0.9 %  sodium chloride infusion     Chief Complaint  Patient presents with  . Follow-up    Pt is doing well over all.     Pulmonary tests PFT 02/19/15 >> FEV1 1.88 (81%), FEV1% 86, TLC 4.29 (86%), DLCO 72% FeNO 12/17/15 >> 75  Cardiac tests Echo 06/30/14 >> EF 55 to 58%, grade 1 diastolic dysfx  Past medical history HLD, Anxiety, Insomnia, RBBB  Past surgical history, Family history, Social history, Allergies reviewed.  Vital Signs BP 108/74 (BP Location: Left Arm, Cuff Size: Normal)   Pulse 89   Ht 5\' 3"  (1.6 m)   Wt 140 lb (63.5 kg)   SpO2 97%   BMI 24.80 kg/m   History of Present Illness Evelyn Murphy is a 71 y.o. female with cough from asthma.  She has been doing better since resuming breo.  She notices a  difference fairly quickly if she misses a few doses.  She is not needing to use ventolin much.  She is not having cough, or sputum.  She gets occasional wheezing, but not as much recently.  No sinus congestion, sore throat, ear pain, skin rash, or gland swelling.  She can exercise w/o difficulty, but will get short of breath if she walks up several flights of stairs.  Recovers after resting for a few minutes.  Her husband has said that she breaths heavy at night.  She will snore occasionally.    Physical Exam  General - pleasant Eyes - pupils reactive, wears glasses ENT - no sinus tenderness, no oral exudate, no LAN, MP 4, scalloped tongue Cardiac - regular, no murmur Chest - no wheeze, rales Abd - soft, non tender Ext - no edema Skin - no rashes Neuro - normal strength Psych - normal mood  CXR from 11/30/16 showed mild scarring  Assessment/Plan  Mild, persistent asthma. - will have her transition from breo to arnuity - continue prn albuterol  Heavy breathing at night. - advised her to try using her inhaler at night time - advised her to monitor her sleep pattern, and then determine if she needs further assessment for sleep apnea   Patient Instructions  Stop breo  Arnuity one puff daily, and rinse mouth after each use  Call if you have more trouble with your breathing after switching to arnuity  Follow up in 6 months   Chesley Mires, MD [facility] Pulmonary/Critical Care/Sleep Pager:  [phone] 02/14/2017, 12:12 PM

## 2017-02-14 NOTE — Patient Instructions (Signed)
Stop breo  Arnuity one puff daily, and rinse mouth after each use  Call if you have more trouble with your breathing after switching to arnuity  Follow up in 6 months

## 2017-02-17 ENCOUNTER — Ambulatory Visit: Payer: Medicare Other | Admitting: Pulmonary Disease

## 2017-03-03 ENCOUNTER — Other Ambulatory Visit: Payer: Self-pay | Admitting: Nurse Practitioner

## 2017-03-03 ENCOUNTER — Other Ambulatory Visit: Payer: Medicare Other | Admitting: *Deleted

## 2017-03-03 DIAGNOSIS — E782 Mixed hyperlipidemia: Secondary | ICD-10-CM

## 2017-03-03 LAB — BASIC METABOLIC PANEL
BUN/Creatinine Ratio: 26 (ref 12–28)
BUN: 27 mg/dL (ref 8–27)
CALCIUM: 10 mg/dL (ref 8.7–10.3)
CHLORIDE: 102 mmol/L (ref 96–106)
CO2: 29 mmol/L (ref 20–29)
CREATININE: 1.03 mg/dL — AB (ref 0.57–1.00)
GFR, EST AFRICAN AMERICAN: 64 mL/min/{1.73_m2} (ref >59)
GFR, EST NON AFRICAN AMERICAN: 56 mL/min/{1.73_m2} — AB (ref >59)
Glucose: 110 mg/dL — ABNORMAL HIGH (ref 65–99)
Potassium: 4.6 mmol/L (ref 3.5–5.2)
Sodium: 144 mmol/L (ref 134–144)

## 2017-03-03 LAB — LIPID PANEL
Chol/HDL Ratio: 2.7 ratio (ref 0.0–4.4)
Cholesterol, Total: 202 mg/dL — ABNORMAL HIGH (ref 100–199)
HDL: 75 mg/dL (ref >39)
LDL CALC: 108 mg/dL — AB (ref 0–99)
Triglycerides: 96 mg/dL (ref 0–149)
VLDL CHOLESTEROL CAL: 19 mg/dL (ref 5–40)

## 2017-03-03 LAB — HEPATIC FUNCTION PANEL
ALBUMIN: 4.5 g/dL (ref 3.6–4.8)
ALT: 21 IU/L (ref 0–32)
AST: 26 IU/L (ref 0–40)
Alkaline Phosphatase: 103 IU/L (ref 39–117)
BILIRUBIN TOTAL: 0.3 mg/dL (ref 0.0–1.2)
BILIRUBIN, DIRECT: 0.13 mg/dL (ref 0.00–0.40)
TOTAL PROTEIN: 7 g/dL (ref 6.0–8.5)

## 2017-03-11 ENCOUNTER — Encounter: Payer: Self-pay | Admitting: Internal Medicine

## 2017-03-11 ENCOUNTER — Ambulatory Visit (INDEPENDENT_AMBULATORY_CARE_PROVIDER_SITE_OTHER): Payer: Medicare Other | Admitting: Internal Medicine

## 2017-03-11 ENCOUNTER — Other Ambulatory Visit: Payer: Medicare Other

## 2017-03-11 VITALS — BP 108/80 | HR 80 | Temp 97.7°F

## 2017-03-11 DIAGNOSIS — I6523 Occlusion and stenosis of bilateral carotid arteries: Secondary | ICD-10-CM | POA: Diagnosis not present

## 2017-03-11 DIAGNOSIS — K5901 Slow transit constipation: Secondary | ICD-10-CM

## 2017-03-11 DIAGNOSIS — F5101 Primary insomnia: Secondary | ICD-10-CM | POA: Diagnosis not present

## 2017-03-11 DIAGNOSIS — R8290 Unspecified abnormal findings in urine: Secondary | ICD-10-CM | POA: Diagnosis not present

## 2017-03-11 LAB — POC URINALSYSI DIPSTICK (AUTOMATED)
BILIRUBIN UA: NEGATIVE
Blood, UA: NEGATIVE
GLUCOSE UA: NEGATIVE
KETONES UA: NEGATIVE
NITRITE UA: NEGATIVE
PH UA: 7 (ref 5.0–8.0)
Protein, UA: NEGATIVE
Spec Grav, UA: 1.02 (ref 1.010–1.025)
Urobilinogen, UA: 0.2 E.U./dL

## 2017-03-11 MED ORDER — CEFUROXIME AXETIL 250 MG PO TABS: 250.0000 mg | ORAL_TABLET | Freq: Two times a day (BID) | ORAL | 0 refills | Status: AC

## 2017-03-11 MED ORDER — ZOLPIDEM TARTRATE 10 MG PO TABS: 10.0000 mg | ORAL_TABLET | Freq: Every day | ORAL | 3 refills | Status: DC

## 2017-03-11 NOTE — Progress Notes (Signed)
Subjective:  Patient ID: Evelyn Murphy, female    DOB: [birth year]  Age: 71 y.o. MRN: [id number]  CC: No chief complaint on file.   HPI Evelyn Murphy presents for dysuria x 1 week C/o LBP F/u depression, insomnia C/o loose stools on Miralax   Outpatient Medications Prior to Visit  Medication Sig Dispense Refill  . aspirin EC 81 MG tablet Take 1 tablet (81 mg total) by mouth daily. 30 tablet 0  . Cholecalciferol (VITAMIN D3) 2000 UNITS capsule Take 1 capsule (2,000 Units total) by mouth daily. 100 capsule 3  . citalopram (CELEXA) 40 MG tablet Take 1 tablet (40 mg total) by mouth daily. 90 tablet 1  . doxepin (SINEQUAN) 75 MG capsule Take 1-2 capsules (75-150 mg total) by mouth at bedtime. 180 capsule 1  . ezetimibe (ZETIA) 10 MG tablet Take 1 tablet (10 mg total) by mouth daily. Please keep 1-15 at 11:20 am appointment for more refills thanks. 90 tablet 0  . Fluticasone Furoate (ARNUITY ELLIPTA) 100 MCG/ACT AEPB Inhale 1 puff into the lungs daily. 30 each 5  . pantoprazole (PROTONIX) 40 MG tablet Take 1 tablet (40 mg total) by mouth daily before breakfast. 90 tablet 3  . pravastatin (PRAVACHOL) 20 MG tablet Take 1 tablet (20 mg total) by mouth daily. 90 tablet 3  . UNABLE TO FIND Med Name: Valerian Root 2 per day    . VENTOLIN HFA 108 (90 Base) MCG/ACT inhaler Inhale 2 puffs into the lungs every 6 (six) hours as needed.     . VOLTAREN 1 % GEL Apply 2 g topically 4 (four) times daily. (Patient taking differently: Apply 2 g topically as needed. ) 1 Tube 1  . zolpidem (AMBIEN) 10 MG tablet TAKE 1 TABLET BY MOUTH AT BEDTIME 30 tablet 3   Facility-Administered Medications Prior to Visit  Medication Dose Route Frequency Provider Last Rate Last Dose  . 0.9 %  sodium chloride infusion  500 mL Intravenous Continuous Gatha Mayer, MD        ROS Review of Systems  Constitutional: Negative for activity change, appetite change, chills, fatigue and unexpected weight change.  HENT: Negative  for congestion, mouth sores and sinus pressure.   Eyes: Negative for visual disturbance.  Respiratory: Negative for cough and chest tightness.   Gastrointestinal: Negative for abdominal pain and nausea.  Genitourinary: Positive for dysuria. Negative for difficulty urinating, frequency and vaginal pain.  Musculoskeletal: Negative for back pain and gait problem.  Skin: Negative for pallor and rash.  Neurological: Negative for dizziness, tremors, weakness, numbness and headaches.  Psychiatric/Behavioral: Negative for confusion and sleep disturbance.    Objective:  BP 108/80 (BP Location: Left Arm, Patient Position: Sitting, Cuff Size: Normal)   Pulse 80   Temp 97.7 F (36.5 C) (Oral)   SpO2 98%   BP Readings from Last 3 Encounters:  03/11/17 108/80  02/14/17 108/74  01/17/17 112/70    Wt Readings from Last 3 Encounters:  02/14/17 140 lb (63.5 kg)  06/28/16 145 lb (65.8 kg)  05/25/16 145 lb (65.8 kg)    Physical Exam  Constitutional: She appears well-developed. No distress.  HENT:  Head: Normocephalic.  Right Ear: External ear normal.  Left Ear: External ear normal.  Nose: Nose normal.  Mouth/Throat: Oropharynx is clear and moist.  Eyes: Conjunctivae are normal. Pupils are equal, round, and reactive to light. Right eye exhibits no discharge. Left eye exhibits no discharge.  Neck: Normal range of motion. Neck supple. No  JVD present. No tracheal deviation present. No thyromegaly present.  Cardiovascular: Normal rate, regular rhythm and normal heart sounds.  Pulmonary/Chest: No stridor. No respiratory distress. She has no wheezes.  Abdominal: Soft. Bowel sounds are normal. She exhibits no distension and no mass. There is no tenderness. There is no rebound and no guarding.  Musculoskeletal: She exhibits no edema or tenderness.  Lymphadenopathy:    She has no cervical adenopathy.  Neurological: She displays normal reflexes. No cranial nerve deficit. She exhibits normal muscle  tone. Coordination normal.  Skin: No rash noted. No erythema.  Psychiatric: She has a normal mood and affect. Her behavior is normal. Judgment and thought content normal.    Lab Results  Component Value Date   WBC 5.6 04/03/2015   HGB 13.7 04/03/2015   HCT 41.7 04/03/2015   PLT 329.0 04/03/2015   GLUCOSE 110 (H) 03/03/2017   CHOL 202 (H) 03/03/2017   TRIG 96 03/03/2017   HDL 75 03/03/2017   LDLDIRECT 135.5 01/24/2013   LDLCALC 108 (H) 03/03/2017   ALT 21 03/03/2017   AST 26 03/03/2017   NA 144 03/03/2017   K 4.6 03/03/2017   CL 102 03/03/2017   CREATININE 1.03 (H) 03/03/2017   BUN 27 03/03/2017   CO2 29 03/03/2017   TSH 1.59 08/25/2015   INR 0.96 06/29/2014   HGBA1C 6.2 (H) 06/29/2014    Dg Chest 2 View  Result Date: 11/30/2016 CLINICAL DATA:  Cough. EXAM: CHEST  2 VIEW COMPARISON:  01/16/2015 . FINDINGS: Mediastinum and hilar structures normal. No acute infiltrate. pleuroparenchymal thickening noted consistent scarring. No pleural effusion or pneumothorax. IMPRESSION: No acute cardiopulmonary disease. Electronically Signed   By: Marcello Moores  Register   On: 11/30/2016 11:53    Assessment & Plan:   Diagnoses and all orders for this visit:  Malodorous urine -     POCT Urinalysis Dipstick (Automated)  Abnormal urinalysis -     CULTURE, URINE COMPREHENSIVE; Future   I am having Evelyn Murphy. Evelyn Murphy maintain her aspirin EC, Vitamin D3, VOLTAREN, VENTOLIN HFA, UNABLE TO FIND, pantoprazole, citalopram, zolpidem, doxepin, pravastatin, ezetimibe, and Fluticasone Furoate. We will continue to administer sodium chloride.  No orders of the defined types were placed in this encounter.    Follow-up: No Follow-up on file.  Walker Kehr, MD

## 2017-03-11 NOTE — Assessment & Plan Note (Signed)
Valerian root, Zolpidem

## 2017-03-11 NOTE — Patient Instructions (Signed)
Use [employer] Peroxicare tooth paste  

## 2017-03-14 LAB — CULTURE, URINE COMPREHENSIVE
MICRO NUMBER:: 81439506
SPECIMEN QUALITY: ADEQUATE

## 2017-03-16 ENCOUNTER — Ambulatory Visit (HOSPITAL_COMMUNITY)
Admission: RE | Admit: 2017-03-16 | Discharge: 2017-03-16 | Disposition: A | Discharge status: Home / Self Care | Payer: Medicare Other | Source: Ambulatory Visit | Attending: Cardiovascular Disease | Admitting: Cardiovascular Disease

## 2017-03-16 DIAGNOSIS — I6523 Occlusion and stenosis of bilateral carotid arteries: Secondary | ICD-10-CM | POA: Insufficient documentation

## 2017-03-17 ENCOUNTER — Encounter: Payer: Self-pay | Admitting: Internal Medicine

## 2017-03-17 NOTE — Assessment & Plan Note (Signed)
- 

## 2017-04-05 ENCOUNTER — Ambulatory Visit (INDEPENDENT_AMBULATORY_CARE_PROVIDER_SITE_OTHER): Payer: Medicare Other | Admitting: Cardiovascular Disease

## 2017-04-05 ENCOUNTER — Encounter: Payer: Self-pay | Admitting: Cardiovascular Disease

## 2017-04-05 VITALS — BP 112/76 | HR 82 | Ht 63.0 in | Wt 140.0 lb

## 2017-04-05 DIAGNOSIS — E782 Mixed hyperlipidemia: Secondary | ICD-10-CM | POA: Diagnosis not present

## 2017-04-05 NOTE — Patient Instructions (Signed)
Medication Instructions:  Your physician recommends that you continue on your current medications as directed. Please refer to the Current Medication list given to you today.   Labwork: Your physician recommends that you return for lab work in: 1 year on the day of or a few days before your office visit with Dr. [name].  You will need to FAST for this appointment - nothing to eat or drink after midnight the night before except water.   Testing/Procedures: None Ordered   Follow-Up: Your physician wants you to follow-up in: 1 year with Dr. [name].  You will receive a reminder letter in the mail two months in advance. If you don't receive a letter, please call our office to schedule the follow-up appointment.   If you need a refill on your cardiac medications before your next appointment, please call your pharmacy.   Thank you for choosing [facility]! Desean Heemstra, RN [phone]    

## 2017-04-05 NOTE — Progress Notes (Signed)
Evelyn Murphy Date of Birth  [birth year]       [facility]    [employer] [address], [place], [place]  [address]   [place], [place]  [address] [phone]     [phone]   Fax  [phone]    Fax [phone]  Problem List: 1. Strong family history of coronary artery disease 2. Hyperlipidemia  History of Present Illness:  Evelyn Murphy is a 72 yo with family hx of CAD ( father died at 2 of MI, mother had MI at 9, CABG x 2, brother has had multiple stents).  Both aunts diet of heart disease.  She is concerned about her risks.   She denies any CP or dyspnea.  She exercises every day - 45 minutes on treadmill, 40 minutes on stationary bike,.  She also adds weights 3-4 days a week.    She has eats a low fat diet,   Dec. 30, 2015:  Evelyn Murphy is a 72 yo with hx o hyperlipidemia and a strong family hx of CAD Her LDL partical number has decreased from 200 to 1233.  Jan. 17, 2017: Doing well. Lipids are up a bit - but she is not on Zetia any longer.   Dec. 13, 2017:  Seen in follow up . Had an episode of double vision a month ago.  Saw her eye doctor .  Carotid duplex scan - was found to have mild bilateral plaque Exercising every day  No CP or dyspnea   April 05, 2017:  Doing well.   Exercising daily .  Tolerating the Zetia and pravachol .  Has mild to moderate carotid artery disease.  Her duplex scan from December, 2018 was unchanged from the previous year.   Current Outpatient Medications on File Prior to Visit  Medication Sig Dispense Refill  . amoxicillin (AMOXIL) 875 MG tablet Take 1 tablet by mouth daily.  1  . aspirin EC 81 MG tablet Take 1 tablet (81 mg total) by mouth daily. 30 tablet 0  . Cholecalciferol (VITAMIN D3) 2000 UNITS capsule Take 1 capsule (2,000 Units total) by mouth daily. 100 capsule 3  . citalopram (CELEXA) 40 MG tablet Take 1 tablet (40 mg total) by mouth daily. 90 tablet 1  . doxepin  (SINEQUAN) 75 MG capsule Take 1-2 capsules (75-150 mg total) by mouth at bedtime. 180 capsule 1  . ezetimibe (ZETIA) 10 MG tablet Take 1 tablet (10 mg total) by mouth daily. Please keep 1-15 at 11:20 am appointment for more refills thanks. 90 tablet 0  . Fluticasone Furoate (ARNUITY ELLIPTA) 100 MCG/ACT AEPB Inhale 1 puff into the lungs daily. 30 each 5  . pantoprazole (PROTONIX) 40 MG tablet Take 1 tablet (40 mg total) by mouth daily before breakfast. 90 tablet 3  . pravastatin (PRAVACHOL) 20 MG tablet Take 1 tablet (20 mg total) by mouth daily. 90 tablet 3  . UNABLE TO FIND Med Name: Valerian Root 2 per day    . VENTOLIN HFA 108 (90 Base) MCG/ACT inhaler Inhale 2 puffs into the lungs every 6 (six) hours as needed.     . VOLTAREN 1 % GEL Apply 2 g topically 4 (four) times daily. (Patient taking differently: Apply 2 g topically as needed. ) 1 Tube 1  . zolpidem (AMBIEN) 10 MG tablet Take 1 tablet (10 mg total) by mouth at bedtime. 30 tablet 3   Current Facility-Administered Medications on File Prior to Visit  Medication Dose Route Frequency Provider Last Rate Last Dose  . 0.9 %  sodium chloride infusion  500 mL Intravenous Continuous Gatha Mayer, MD        Allergies  Allergen Reactions  . Linzess [Linaclotide]     Too strong  . Lipitor [Atorvastatin]     elev LFTs    Past Medical History:  Diagnosis Date  . Anxiety   . Asthma   . Confusion 2016   admitted - ? TIA - NOT - was overmedication issue  . Depression   . Hyperlipidemia   . Insomnia   . RBBB     Past Surgical History:  Procedure Laterality Date  . COLONOSCOPY    . SHOULDER ARTHROSCOPY  10/2011   R Dr Meyer Cory    Social History   Tobacco Use  Smoking Status Never Smoker  Smokeless Tobacco Never Used    Social History   Substance and Sexual Activity  Alcohol Use No  . Alcohol/week: 0.0 oz    Family History  Problem Relation Age of Onset  . Heart disease Mother 28       CABG  . Diabetes Mother   .  Macular degeneration Mother   . Heart disease Father        Father died of MI at 72  . COPD Father   . Heart disease Brother   . Diabetes Brother   . Macular degeneration Brother     Reviw of Systems:  Noted in current history.  Otherwise systems are negative.  Physical Exam: Blood pressure 112/76, pulse 82, height 5\' 3"  (1.6 m), weight 140 lb (63.5 kg), SpO2 97 %.  GEN:  Well nourished, well developed in no acute distress HEENT: Normal NECK: No JVD; No carotid bruits LYMPHATICS: No lymphadenopathy CARDIAC: RR,  Soft systolic murmur  RESPIRATORY:  Clear to auscultation without rales, wheezing or rhonchi  ABDOMEN: Soft, non-tender, non-distended MUSCULOSKELETAL:  No edema; No deformity  SKIN: Warm and dry NEUROLOGIC:  Alert and oriented x 3    ECG: April 05, 2017: Normal sinus rhythm.  Right bundle branch block  Assessment / Plan:   1. Strong family history of coronary artery disease  2. Hyperlipidemia-  Lipids are better.  Continue meds.   3. ? TIA - had an episode of double vision for about 10-20 seconds.   I've recommended that she see neuro.  She is not having any other neuro symptoms   [name], MD  04/05/2017 11:51 AM    [address],  [address] [place], [place]  [address] Pager [phone] Phone: [phone]; Fax: [phone]

## 2017-04-06 NOTE — Addendum Note (Signed)
Addended by: De Burrs on: 04/06/2017 03:41 PM   Modules accepted: Orders

## 2017-04-13 NOTE — Addendum Note (Signed)
Addended by: De Burrs on: 04/13/2017 12:52 PM   Modules accepted: Orders

## 2017-04-21 DIAGNOSIS — L02512 Cutaneous abscess of left hand: Secondary | ICD-10-CM | POA: Diagnosis not present

## 2017-05-12 ENCOUNTER — Other Ambulatory Visit: Payer: Self-pay | Admitting: Cardiovascular Disease

## 2017-05-12 ENCOUNTER — Ambulatory Visit: Payer: Medicare Other

## 2017-05-25 ENCOUNTER — Ambulatory Visit (INDEPENDENT_AMBULATORY_CARE_PROVIDER_SITE_OTHER): Payer: Medicare Other | Admitting: *Deleted

## 2017-05-25 VITALS — BP 110/74 | HR 86 | Resp 18 | Ht 63.0 in

## 2017-05-25 DIAGNOSIS — Z0000 Encounter for general adult medical examination without abnormal findings: Secondary | ICD-10-CM | POA: Diagnosis not present

## 2017-05-25 NOTE — Patient Instructions (Signed)
Continue doing brain stimulating activities (puzzles, reading, adult coloring books, staying active) to keep memory sharp.   Continue to eat heart healthy diet (full of fruits, vegetables, whole grains, lean protein, water--limit salt, fat, and sugar intake) and increase physical activity as tolerated.   Evelyn Murphy , Thank you for taking time to come for your Medicare Wellness Visit. I appreciate your ongoing commitment to your health goals. Please review the following plan we discussed and let me know if I can assist you in the future.   These are the goals we discussed: Goals    . LDL CALC < 100     LDL-P < 1300 at least, and prefer < 1000 nmol/L preferred given very strong family    . patient     I want to lose 10 pounds. I will continue to monitor my what I eat and exercise.        This is a list of the screening recommended for you and due dates:  Health Maintenance  Topic Date Due  .  Hepatitis C: One time screening is recommended by Center for Disease Control  (CDC) for  adults born from [birth year] through 1963.   February 13, 1946  . Mammogram  04/22/2014  . Tetanus Vaccine  10/11/2019  . Colon Cancer Screening  11/22/2019  . Flu Shot  Completed  . DEXA scan (bone density measurement)  Completed  . Pneumonia vaccines  Completed

## 2017-05-25 NOTE — Progress Notes (Addendum)
Subjective:   Evelyn Murphy is a 72 y.o. female who presents for Medicare Annual (Subsequent) preventive examination.  Review of Systems:  No ROS.  Medicare Wellness Visit. Additional risk factors are reflected in the social history.  Cardiac Risk Factors include: advanced age (>71men, >79 women);dyslipidemia Sleep patterns: has difficulty falling asleep, gets up 1 times nightly to void and sleeps 6-7 hours nightly.    Home Safety/Smoke Alarms: Feels safe in home. Smoke alarms in place.  Living environment; residence and Firearm Safety: 2-story house, no firearms. Lives with husband, no needs for DME, good support system Seat Belt Safety/Bike Helmet: Wears seat belt.     Objective:     Vitals: BP 110/74   Pulse 86   Resp 18   Ht 5\' 3"  (1.6 m)   SpO2 96%   BMI 24.80 kg/m   Body mass index is 24.8 kg/m.  Advanced Directives 05/25/2017 06/28/2016 02/19/2015 02/11/2015 06/29/2014  Does Patient Have a Medical Advance Directive? Yes Yes Yes Yes No  Type of Paramedic of [place];Living will [place];Living will [place];Living will Living will -  Does patient want to make changes to medical advance directive? - - - No - Patient declined -  Copy of [place] in Chart? No - copy requested No - copy requested - No - copy requested -  Would patient like information on creating a medical advance directive? - - - - No - patient declined information    Tobacco Social History   Tobacco Use  Smoking Status Never Smoker  Smokeless Tobacco Never Used     Counseling given: Not Answered  Past Medical History:  Diagnosis Date  . Anxiety   . Asthma   . Confusion 2016   admitted - ? TIA - NOT - was overmedication issue  . Depression   . Hyperlipidemia   . Insomnia   . RBBB    Past Surgical History:  Procedure Laterality Date  . COLONOSCOPY    . SHOULDER ARTHROSCOPY  10/2011   R Dr Meyer Cory   Family  History  Problem Relation Age of Onset  . Heart disease Mother 8       CABG  . Diabetes Mother   . Macular degeneration Mother   . Heart disease Father        Father died of MI at 29  . COPD Father   . Heart disease Brother   . Diabetes Brother   . Macular degeneration Brother    Social History   Socioeconomic History  . Marital status: Married    Spouse name: None  . Number of children: 2  . Years of education: None  . Highest education level: None  Social Needs  . Financial resource strain: Not hard at all  . Food insecurity - worry: Never true  . Food insecurity - inability: Never true  . Transportation needs - medical: No  . Transportation needs - non-medical: No  Occupational History  . Occupation: Retired  Tobacco Use  . Smoking status: Never Smoker  . Smokeless tobacco: Never Used  Substance and Sexual Activity  . Alcohol use: No    Alcohol/week: 0.0 oz    Frequency: Never  . Drug use: No  . Sexual activity: Yes  Other Topics Concern  . None  Social History Narrative   Married retired 2 daughters    Outpatient Encounter Medications as of 05/25/2017  Medication Sig  . aspirin EC  81 MG tablet Take 1 tablet (81 mg total) by mouth daily.  . Cholecalciferol (VITAMIN D3) 2000 UNITS capsule Take 1 capsule (2,000 Units total) by mouth daily.  . citalopram (CELEXA) 40 MG tablet Take 1 tablet (40 mg total) by mouth daily.  Marland Kitchen doxepin (SINEQUAN) 75 MG capsule Take 1-2 capsules (75-150 mg total) by mouth at bedtime.  Marland Kitchen ezetimibe (ZETIA) 10 MG tablet TAKE 1 TABLET BY MOUTH DAILY  . Fluticasone Furoate (ARNUITY ELLIPTA) 100 MCG/ACT AEPB Inhale 1 puff into the lungs daily.  . Multiple Vitamins-Minerals (MULTIVITAMIN WITH MINERALS) tablet Take 1 tablet by mouth daily.   . pantoprazole (PROTONIX) 40 MG tablet Take 1 tablet (40 mg total) by mouth daily before breakfast.  . pravastatin (PRAVACHOL) 20 MG tablet Take 1 tablet (20 mg total) by mouth daily.  Marland Kitchen UNABLE TO FIND Med  Name: Valerian Root 2 per day  . VENTOLIN HFA 108 (90 Base) MCG/ACT inhaler Inhale 2 puffs into the lungs every 6 (six) hours as needed.   . VOLTAREN 1 % GEL Apply 2 g topically 4 (four) times daily. (Patient taking differently: Apply 2 g topically as needed. )  . zolpidem (AMBIEN) 10 MG tablet Take 1 tablet (10 mg total) by mouth at bedtime.  . [DISCONTINUED] amoxicillin (AMOXIL) 875 MG tablet Take 1 tablet by mouth daily.   Facility-Administered Encounter Medications as of 05/25/2017  Medication  . 0.9 %  sodium chloride infusion    Activities of Daily Living In your present state of health, do you have any difficulty performing the following activities: 05/25/2017  Hearing? N  Vision? N  Difficulty concentrating or making decisions? N  Walking or climbing stairs? N  Dressing or bathing? N  Doing errands, shopping? N  Preparing Food and eating ? N  Using the Toilet? N  In the past six months, have you accidently leaked urine? N  Do you have problems with loss of bowel control? N  Managing your Medications? N  Managing your Finances? N  Housekeeping or managing your Housekeeping? N  Some recent data might be hidden    Patient Care Team: Plotnikov, [name], MD as PCP - General [name], Wonda Cheng, MD as PCP - Cardiology (Cardiology) Garald Balding, MD as Attending Physician (Orthopedic Surgery) Delila Pereyra, MD (Obstetrics and Gynecology) Gatha Mayer, MD as Consulting Physician (Gastroenterology)    Assessment:   This is a routine wellness examination for Evelyn Murphy. Physical assessment deferred to PCP.   Exercise Activities and Dietary recommendations Current Exercise Habits: Structured exercise class, Type of exercise: treadmill;strength training/weights;calisthenics, Time (Minutes): 50, Frequency (Times/Week): 5, Weekly Exercise (Minutes/Week): 250, Intensity: Moderate, Exercise limited by: None identified  Diet (meal preparation, eat out, water intake, caffeinated  beverages, dairy products, fruits and vegetables): in general, a "healthy" diet  , well balanced, eats a variety of fruits and vegetables daily, limits salt, fat/cholesterol, sugar,carbohydrates,caffeine, drinks 6-8 glasses of water daily.  Goals    . LDL CALC < 100     LDL-P < 1300 at least, and prefer < 1000 nmol/L preferred given very strong family    . patient     I want to lose 10 pounds. I will continue to monitor my what I eat and exercise.        Fall Risk Fall Risk  05/25/2017 11/17/2016 05/25/2016 10/03/2014  Falls in the past year? No No Yes No  Number falls in past yr: - - 1 -  Injury with Fall? - - Yes -  Risk for fall due to : - - History of fall(s) -    Depression Screen PHQ 2/9 Scores 05/25/2017 03/11/2017 11/17/2016 10/03/2014  PHQ - 2 Score 0 0 0 0  PHQ- 9 Score 1 0 0 -     Cognitive Function       Ad8 score reviewed for issues:  Issues making decisions: no  Less interest in hobbies / activities: no  Repeats questions, stories (family complaining): no  Trouble using ordinary gadgets (microwave, computer, phone):no  Forgets the month or year: no  Mismanaging finances: no  Remembering appts: no  Daily problems with thinking and/or memory: no Ad8 score is= 0  Immunization History  Administered Date(s) Administered  . Influenza Whole 12/26/2011  . Influenza, High Dose Seasonal PF 11/17/2016  . Influenza,inj,Quad PF,6+ Mos 02/09/2015, 11/10/2015  . Influenza-Unspecified 12/24/2012, 12/04/2013  . Pneumococcal Conjugate-13 01/24/2013  . Pneumococcal Polysaccharide-23 10/03/2014  . Td 10/10/2009    Screening Tests Health Maintenance  Topic Date Due  . Hepatitis C Screening  [birth year]  . MAMMOGRAM  04/22/2014  . TETANUS/TDAP  10/11/2019  . COLONOSCOPY  11/22/2019  . INFLUENZA VACCINE  Completed  . DEXA SCAN  Completed  . PNA vac Low Risk Adult  Completed      Plan:     Continue doing brain stimulating activities (puzzles, reading, adult  coloring books, staying active) to keep memory sharp.   Continue to eat heart healthy diet (full of fruits, vegetables, whole grains, lean protein, water--limit salt, fat, and sugar intake) and increase physical activity as tolerated.  I have personally reviewed and noted the following in the patient's chart:   . Medical and social history . Use of alcohol, tobacco or illicit drugs  . Current medications and supplements . Functional ability and status . Nutritional status . Physical activity . Advanced directives . List of other physicians . Vitals . Screenings to include cognitive, depression, and falls . Referrals and appointments  In addition, I have reviewed and discussed with patient certain preventive protocols, quality metrics, and best practice recommendations. A written personalized care plan for preventive services as well as general preventive health recommendations were provided to patient.     Michiel Cowboy, RN  05/25/2017  Medical screening examination/treatment/procedure(s) were performed by non-physician practitioner and as supervising physician I was immediately available for consultation/collaboration. I agree with above. Lew Dawes, MD

## 2017-06-17 ENCOUNTER — Other Ambulatory Visit: Payer: Self-pay | Admitting: Surgery

## 2017-06-17 DIAGNOSIS — E042 Nontoxic multinodular goiter: Secondary | ICD-10-CM

## 2017-06-23 ENCOUNTER — Other Ambulatory Visit: Payer: Medicare Other

## 2017-06-23 DIAGNOSIS — E042 Nontoxic multinodular goiter: Secondary | ICD-10-CM | POA: Diagnosis not present

## 2017-06-23 DIAGNOSIS — E213 Hyperparathyroidism, unspecified: Secondary | ICD-10-CM | POA: Diagnosis not present

## 2017-06-28 ENCOUNTER — Ambulatory Visit
Admission: RE | Admit: 2017-06-28 | Discharge: 2017-06-28 | Disposition: A | Discharge status: Home / Self Care | Payer: Medicare Other | Source: Ambulatory Visit | Attending: Surgery | Admitting: Surgery

## 2017-06-28 DIAGNOSIS — E042 Nontoxic multinodular goiter: Secondary | ICD-10-CM | POA: Diagnosis not present

## 2017-07-18 ENCOUNTER — Encounter: Payer: Self-pay | Admitting: Internal Medicine

## 2017-07-18 ENCOUNTER — Ambulatory Visit: Payer: Medicare Other | Admitting: Internal Medicine

## 2017-07-18 ENCOUNTER — Ambulatory Visit (INDEPENDENT_AMBULATORY_CARE_PROVIDER_SITE_OTHER): Payer: Medicare Other | Admitting: Internal Medicine

## 2017-07-18 ENCOUNTER — Other Ambulatory Visit (INDEPENDENT_AMBULATORY_CARE_PROVIDER_SITE_OTHER): Payer: Medicare Other

## 2017-07-18 DIAGNOSIS — J4530 Mild persistent asthma, uncomplicated: Secondary | ICD-10-CM | POA: Diagnosis not present

## 2017-07-18 DIAGNOSIS — K219 Gastro-esophageal reflux disease without esophagitis: Secondary | ICD-10-CM | POA: Diagnosis not present

## 2017-07-18 DIAGNOSIS — E785 Hyperlipidemia, unspecified: Secondary | ICD-10-CM

## 2017-07-18 LAB — LIPID PANEL
CHOL/HDL RATIO: 3
Cholesterol: 171 mg/dL (ref 0–200)
HDL: 63.3 mg/dL (ref >39.00)
LDL CALC: 93 mg/dL (ref 0–99)
NonHDL: 107.32
TRIGLYCERIDES: 74 mg/dL (ref 0.0–149.0)
VLDL: 14.8 mg/dL (ref 0.0–40.0)

## 2017-07-18 LAB — HEPATIC FUNCTION PANEL
ALBUMIN: 4.1 g/dL (ref 3.5–5.2)
ALK PHOS: 92 U/L (ref 39–117)
ALT: 13 U/L (ref 0–35)
AST: 17 U/L (ref 0–37)
Bilirubin, Direct: 0.1 mg/dL (ref 0.0–0.3)
TOTAL PROTEIN: 7 g/dL (ref 6.0–8.3)
Total Bilirubin: 0.3 mg/dL (ref 0.2–1.2)

## 2017-07-18 MED ORDER — PANTOPRAZOLE SODIUM 40 MG PO TBEC: 40.0000 mg | DELAYED_RELEASE_TABLET | Freq: Every day | ORAL | 3 refills | Status: DC

## 2017-07-18 MED ORDER — ZOLPIDEM TARTRATE 10 MG PO TABS: 10.0000 mg | ORAL_TABLET | Freq: Every day | ORAL | 1 refills | Status: DC

## 2017-07-18 NOTE — Assessment & Plan Note (Signed)
Zetia, Pravastatin

## 2017-07-18 NOTE — Assessment & Plan Note (Signed)
Albuterol prn

## 2017-07-18 NOTE — Progress Notes (Signed)
Subjective:  Patient ID: Evelyn Murphy, female    DOB: 12-Sep-1945  Age: 72 y.o. MRN: [id number]  CC: No chief complaint on file.   HPI Evelyn Murphy presents for insomnia, depression, dyslipidemia. C/o SOB at night relieved w/inhaler. Had labs w/Dr Harlow Asa - thyroid  Outpatient Medications Prior to Visit  Medication Sig Dispense Refill  . aspirin EC 81 MG tablet Take 1 tablet (81 mg total) by mouth daily. 30 tablet 0  . Cholecalciferol (VITAMIN D3) 2000 UNITS capsule Take 1 capsule (2,000 Units total) by mouth daily. 100 capsule 3  . citalopram (CELEXA) 40 MG tablet Take 1 tablet (40 mg total) by mouth daily. 90 tablet 1  . doxepin (SINEQUAN) 75 MG capsule Take 1-2 capsules (75-150 mg total) by mouth at bedtime. 180 capsule 1  . ezetimibe (ZETIA) 10 MG tablet TAKE 1 TABLET BY MOUTH DAILY 90 tablet 3  . Fluticasone Furoate (ARNUITY ELLIPTA) 100 MCG/ACT AEPB Inhale 1 puff into the lungs daily. 30 each 5  . Multiple Vitamins-Minerals (MULTIVITAMIN WITH MINERALS) tablet Take 1 tablet by mouth daily.     . pantoprazole (PROTONIX) 40 MG tablet Take 1 tablet (40 mg total) by mouth daily before breakfast. 90 tablet 3  . pravastatin (PRAVACHOL) 20 MG tablet Take 1 tablet (20 mg total) by mouth daily. 90 tablet 3  . UNABLE TO FIND Med Name: Valerian Root 2 per day    . VENTOLIN HFA 108 (90 Base) MCG/ACT inhaler Inhale 2 puffs into the lungs every 6 (six) hours as needed.     . VOLTAREN 1 % GEL Apply 2 g topically 4 (four) times daily. (Patient taking differently: Apply 2 g topically as needed. ) 1 Tube 1  . zolpidem (AMBIEN) 10 MG tablet Take 1 tablet (10 mg total) by mouth at bedtime. 30 tablet 3   Facility-Administered Medications Prior to Visit  Medication Dose Route Frequency Provider Last Rate Last Dose  . 0.9 %  sodium chloride infusion  500 mL Intravenous Continuous Gatha Mayer, MD        ROS Review of Systems  Constitutional: Negative.  Negative for activity change, appetite  change, chills, diaphoresis, fatigue, fever and unexpected weight change.  HENT: Negative for congestion, ear pain, facial swelling, hearing loss, mouth sores, nosebleeds, postnasal drip, rhinorrhea, sinus pressure, sneezing, sore throat, tinnitus and trouble swallowing.   Eyes: Negative for pain, discharge, redness, itching and visual disturbance.  Respiratory: Negative for cough, chest tightness, shortness of breath, wheezing and stridor.   Cardiovascular: Negative for chest pain, palpitations and leg swelling.  Gastrointestinal: Negative for abdominal distention, anal bleeding, blood in stool, constipation, diarrhea, nausea and rectal pain.  Genitourinary: Negative for difficulty urinating, dysuria, flank pain, frequency, genital sores, hematuria, pelvic pain, urgency, vaginal bleeding and vaginal discharge.  Musculoskeletal: Negative for arthralgias, back pain, gait problem, joint swelling, neck pain and neck stiffness.  Skin: Negative.  Negative for rash.  Neurological: Negative for dizziness, tremors, seizures, syncope, speech difficulty, weakness, numbness and headaches.  Hematological: Negative for adenopathy. Does not bruise/bleed easily.  Psychiatric/Behavioral: Positive for sleep disturbance. Negative for behavioral problems, decreased concentration, dysphoric mood and suicidal ideas. The patient is not nervous/anxious.     Objective:  BP 114/72 (BP Location: Left Arm, Patient Position: Sitting, Cuff Size: Normal)   Pulse 71   Temp 97.8 F (36.6 C) (Oral)   SpO2 98%   BP Readings from Last 3 Encounters:  07/18/17 114/72  05/25/17 110/74  04/05/17 112/76  Wt Readings from Last 3 Encounters:  04/05/17 140 lb (63.5 kg)  02/14/17 140 lb (63.5 kg)  06/28/16 145 lb (65.8 kg)    Physical Exam  Constitutional: She appears well-developed. No distress.  HENT:  Head: Normocephalic.  Right Ear: External ear normal.  Left Ear: External ear normal.  Nose: Nose normal.    Mouth/Throat: Oropharynx is clear and moist.  Eyes: Pupils are equal, round, and reactive to light. Conjunctivae are normal. Right eye exhibits no discharge. Left eye exhibits no discharge.  Neck: Normal range of motion. Neck supple. No JVD present. No tracheal deviation present. No thyromegaly present.  Cardiovascular: Normal rate, regular rhythm and normal heart sounds.  Pulmonary/Chest: No stridor. No respiratory distress. She has no wheezes.  Abdominal: Soft. Bowel sounds are normal. She exhibits no distension and no mass. There is no tenderness. There is no rebound and no guarding.  Musculoskeletal: She exhibits no edema or tenderness.  Lymphadenopathy:    She has no cervical adenopathy.  Neurological: She displays normal reflexes. No cranial nerve deficit. She exhibits normal muscle tone. Coordination normal.  Skin: No rash noted. No erythema.  Psychiatric: She has a normal mood and affect. Her behavior is normal. Judgment and thought content normal.    Lab Results  Component Value Date   WBC 5.6 04/03/2015   HGB 13.7 04/03/2015   HCT 41.7 04/03/2015   PLT 329.0 04/03/2015   GLUCOSE 110 (H) 03/03/2017   CHOL 202 (H) 03/03/2017   TRIG 96 03/03/2017   HDL 75 03/03/2017   LDLDIRECT 135.5 01/24/2013   LDLCALC 108 (H) 03/03/2017   ALT 21 03/03/2017   AST 26 03/03/2017   NA 144 03/03/2017   K 4.6 03/03/2017   CL 102 03/03/2017   CREATININE 1.03 (H) 03/03/2017   BUN 27 03/03/2017   CO2 29 03/03/2017   TSH 1.59 08/25/2015   INR 0.96 06/29/2014   HGBA1C 6.2 (H) 06/29/2014    US Thyroid  Result Date: 06/28/2017 CLINICAL DATA:  Prior ultrasound follow-up. Follow-up thyroid nodules. EXAM: THYROID ULTRASOUND TECHNIQUE: Ultrasound examination of the thyroid gland and adjacent soft tissues was performed. COMPARISON:  04/01/2016; 02/25/2015; 09/18/2014; ultrasound-guided left-sided thyroid nodule fine-needle aspiration - 04/02/2025 FINDINGS: Parenchymal Echotexture: Mildly  heterogenous Isthmus: Normal in size measures 0.3 cm in diameter, unchanged Right lobe: Normal in size measuring 4.0 x 1.8 x 2.0 cm, unchanged, 4.3 x 1.6 x 2.3 cm Left lobe: Normal in size measuring 4.0 x 0.2 x 5 cm, unchanged from previously 4.5 x 0.9 x 1.4 cm _________________________________________________________ Estimated total number of nodules >/= 1 cm: 2 Number of spongiform nodules >/=  2 cm not described below (TR1): 0 Number of mixed cystic and solid nodules >/= 1.5 cm not described below ([place]): 0 _________________________________________________________ Nodule # 3 (previously labeled 1): Prior biopsy: No Location: Right; Superior Maximum size: 0.4 cm; Other 2 dimensions: 0.4 cm, previously, 0.4 x 0.4 x 0.4 cm Composition: solid/almost completely solid (2) Echogenicity: hypoechoic (2) Shape: not taller-than-wide (0) Margins: ill-defined (0) Echogenic foci: none (0) ACR TI-RADS total points: 4. ACR TI-RADS risk category:  TR4 (4-6 points). Significant change in size (>/= 20% in two dimensions and minimal increase of 2 mm): No Change in features: Yes - the nodule no longer appears very hypoechoic nor taller than wide Change in ACR TI-RADS risk category: Yes - previously TR5, currently TR4 ACR TI-RADS recommendations: Given size (<0.9 cm) and appearance, this nodule does NOT meet TI-RADS criteria for biopsy or dedicated follow-up. _________________________________________________________  Nodule # 2: Prior biopsy: No Location: Left; Inferior Maximum size: 1.0 cm; Other 2 dimensions: 0.6 x 0.6 cm, previously, 1.0 x 0.6 x 0.4 cm (when compared to the 08/2014 examination) Composition: solid/almost completely solid (2) Echogenicity: hypoechoic (2) Shape: not taller-than-wide (0) Margins: ill-defined (0) Echogenic foci: none (0) ACR TI-RADS total points: 4. ACR TI-RADS risk category:  TR4 (4-6 points). Significant change in size (>/= 20% in two dimensions and minimal increase of 2 mm): No Change in features: No  Change in ACR TI-RADS risk category: No ACR TI-RADS recommendations: *Given size (>/= 1 - 1.4 cm) and appearance, a follow-up ultrasound in 1 year should be considered based on TI-RADS criteria. _________________________________________________________ The previously questioned punctate (approximately 0.5 cm) nodule/pseudo nodule within the mid posterior aspect the right lobe of the thyroid is not definitely seen on the present examination and thus favored to have represented a pseudo nodule. The previously biopsied approximately 1.4 x 1.0 x 0.7 cm nodule within the mid, medial aspect the left lobe of the thyroid (labeled 1) is grossly unchanged compared to the 08/2014 examination, previously, 1.3 x 1.2 x 0.7 cm. Correlation with prior biopsy results is recommended. IMPRESSION: 1. Findings suggestive of multinodular goiter as detailed above. No definitive new or enlarging thyroid nodules. 2. Nodule #2 within the left lobe of the thyroid is unchanged compared to the 06/16 examination though again meets imaging criteria to recommend a 1 year follow-up. This examination documents nearly 3 years of stability. 3. Nodule #3 within the right lobe of the thyroid no longer meets imaging criteria to recommend continued dedicated follow-up. 4. Previously biopsied dominant nodule with the mid, medial aspect the left lobe of the thyroid is unchanged compared to the 08/2014 examination. Correlation with prior biopsy results is recommended. Assuming a benign pathologic diagnosis, repeat sampling and/or continued dedicated follow-up is not recommended. The above is in keeping with the ACR TI-RADS recommendations - J Am Coll Radiol 2017;14:587-595. Electronically Signed   By: Sandi Mariscal M.D.   On: 06/28/2017 13:52    Assessment & Plan:   There are no diagnoses linked to this encounter. I am having [name]. Deneise Lever maintain her aspirin EC, Vitamin D3, VOLTAREN, VENTOLIN HFA, UNABLE TO FIND, pantoprazole, citalopram, doxepin,  pravastatin, Fluticasone Furoate, zolpidem, ezetimibe, and multivitamin with minerals. We will continue to administer sodium chloride.  No orders of the defined types were placed in this encounter.    Follow-up: No follow-ups on file.  Walker Kehr, MD

## 2017-07-18 NOTE — Assessment & Plan Note (Signed)
Protonix.  ?

## 2017-07-18 NOTE — Patient Instructions (Signed)
MC well w/Jill 

## 2017-07-25 IMAGING — DX DG CHEST 2V
2 series · 2 of 2 positions shown · non-contrast
Comparison: 06/29/2014

CLINICAL DATA: Dry cough for 6 weeks

EXAM:
CHEST  2 VIEW

[chest pa]
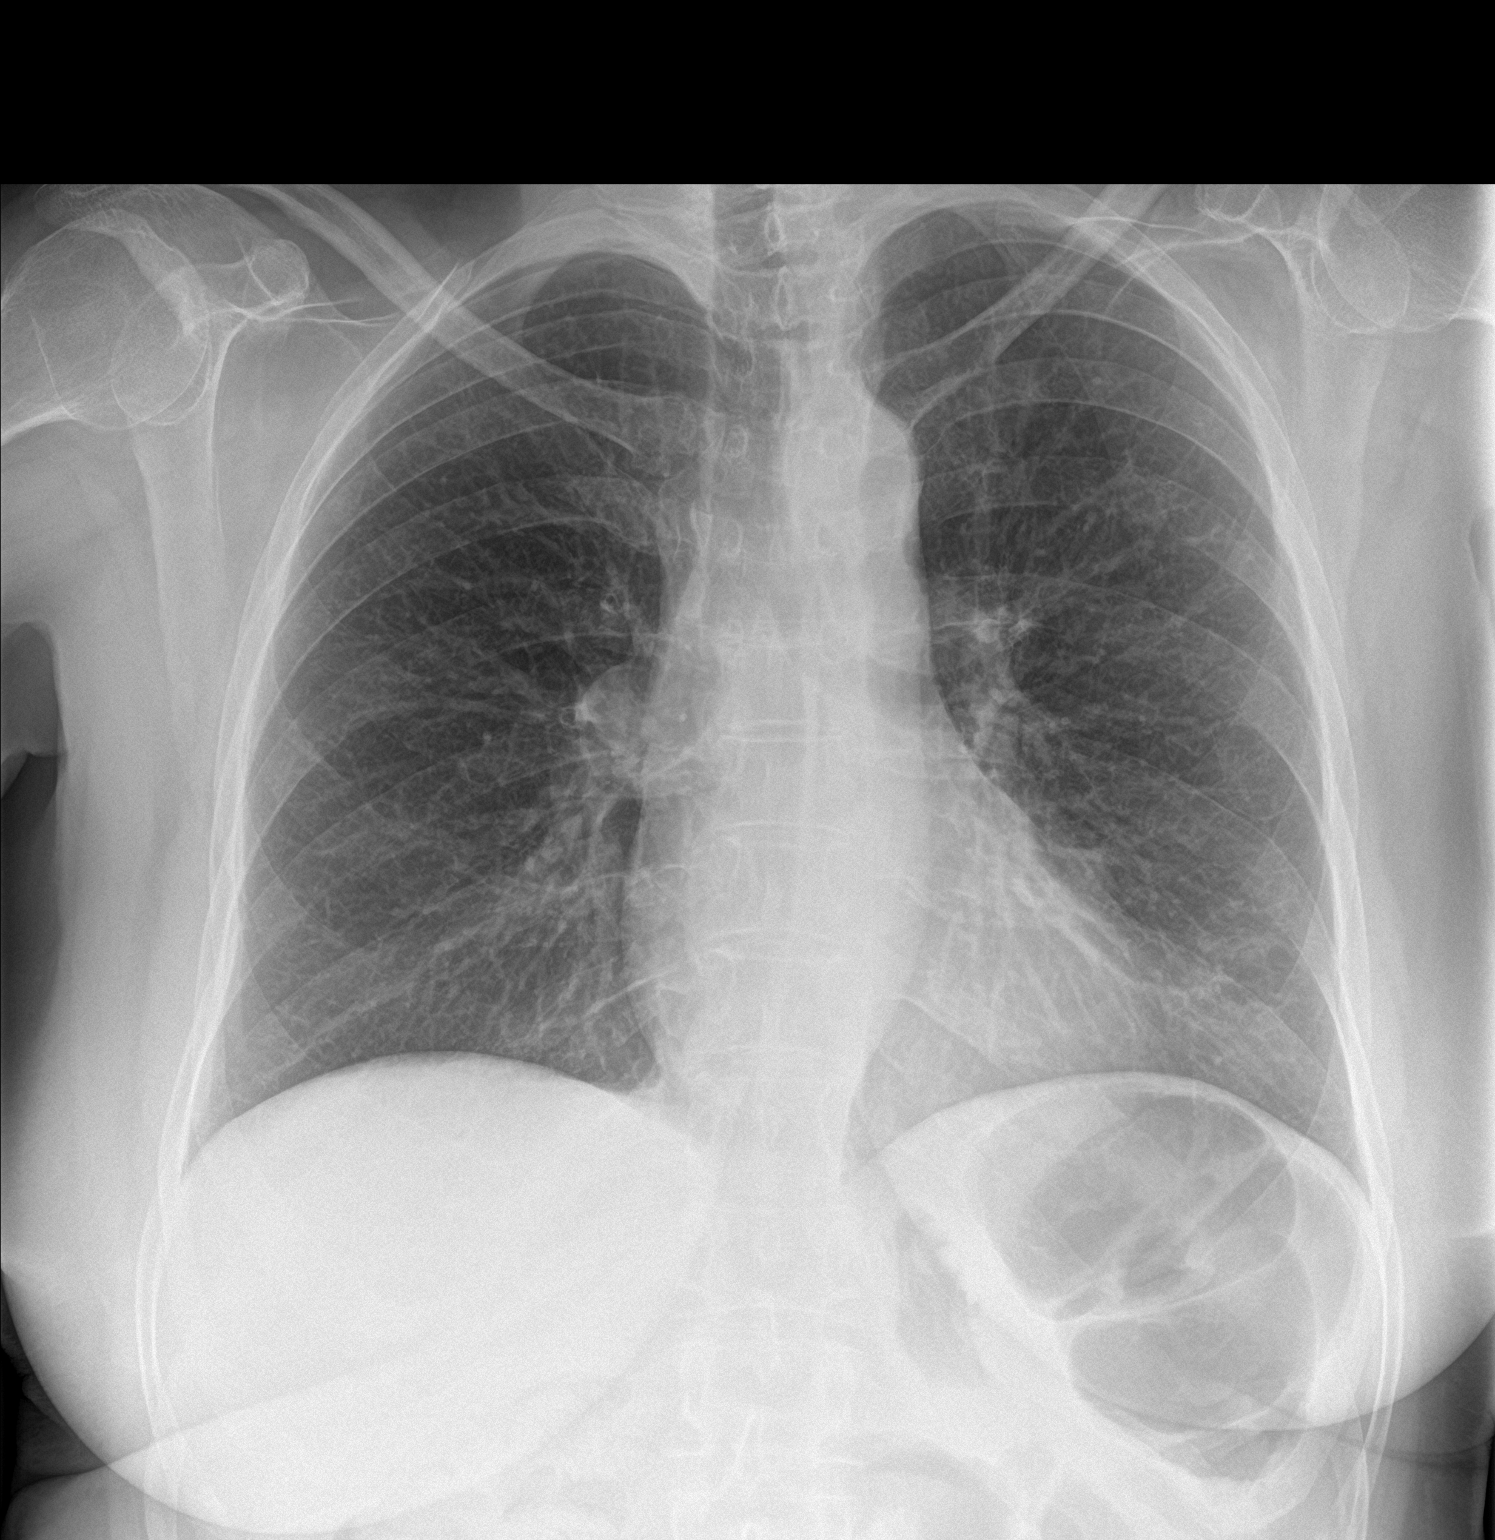

[chest lat]
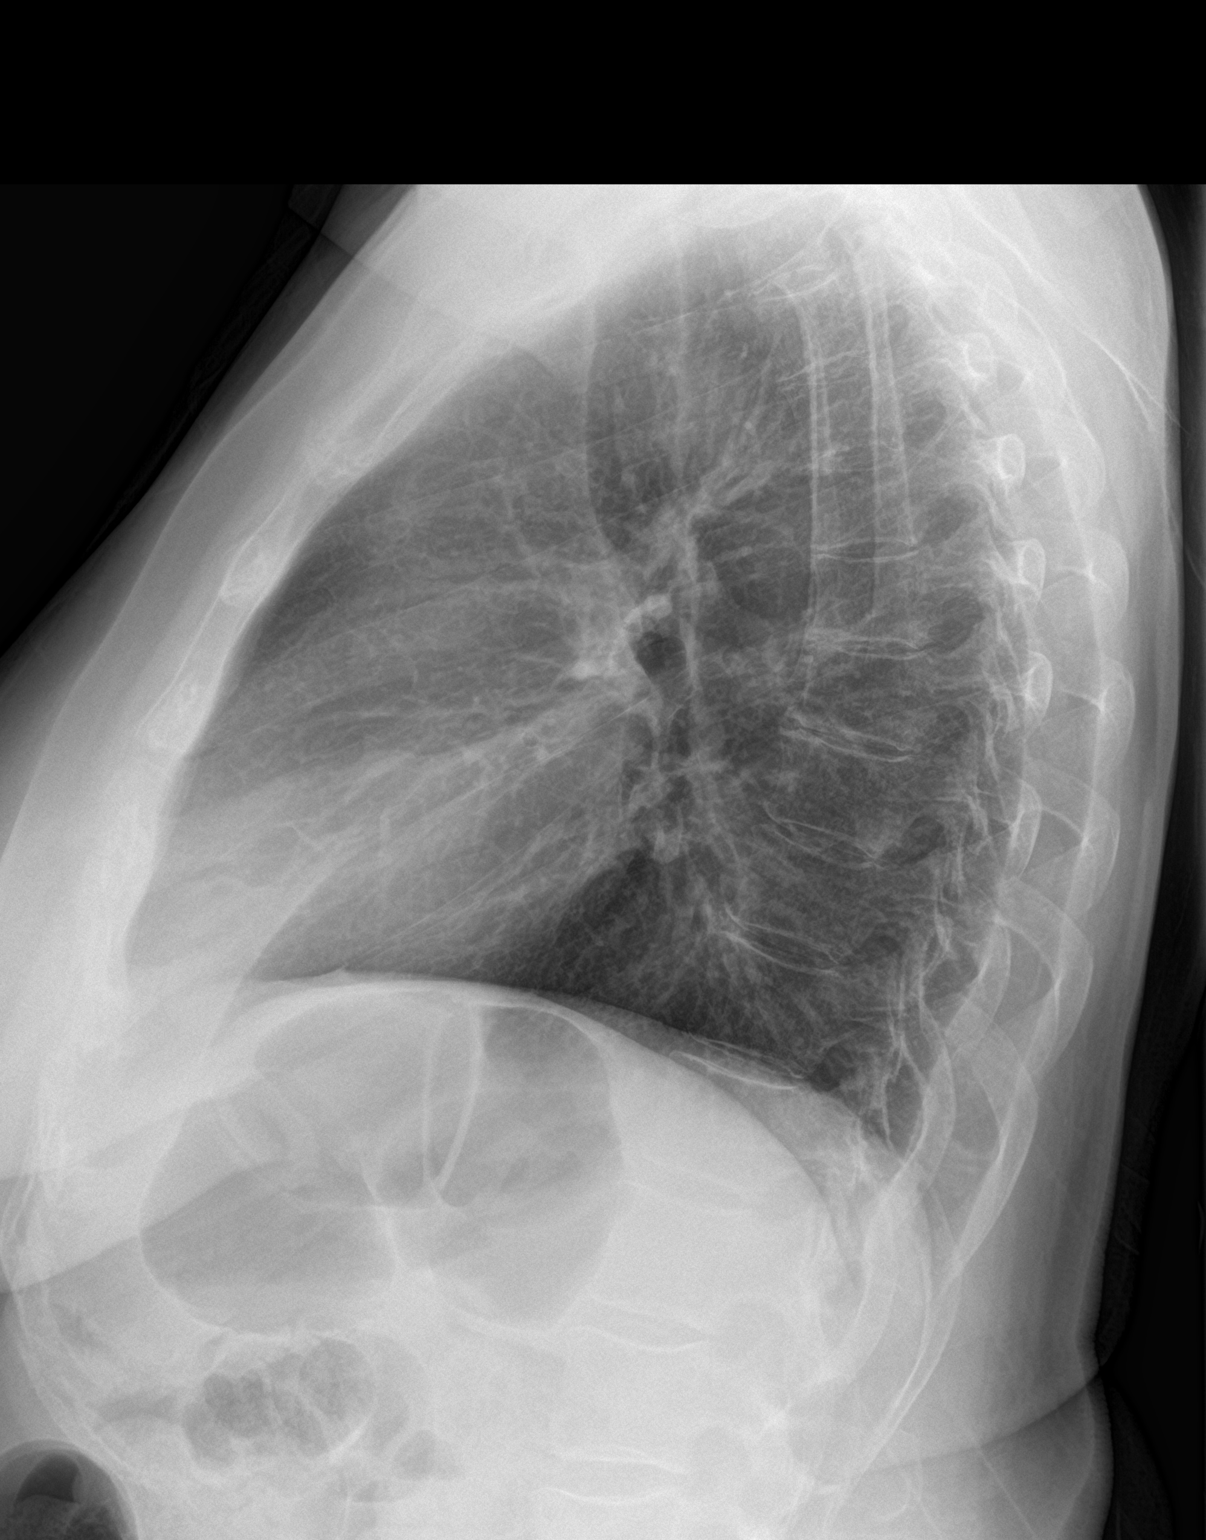

[2 of 2 positions shown; findings below may reference images not displayed]

FINDINGS: The heart size and mediastinal contours are within normal limits.
Both lungs are clear. The visualized skeletal structures are
unremarkable.
IMPRESSION: No active cardiopulmonary disease.

## 2017-07-27 DIAGNOSIS — E042 Nontoxic multinodular goiter: Secondary | ICD-10-CM | POA: Diagnosis not present

## 2017-07-27 DIAGNOSIS — E210 Primary hyperparathyroidism: Secondary | ICD-10-CM | POA: Diagnosis not present

## 2017-07-28 ENCOUNTER — Other Ambulatory Visit (HOSPITAL_COMMUNITY): Payer: Self-pay | Admitting: Surgery

## 2017-07-28 DIAGNOSIS — E213 Hyperparathyroidism, unspecified: Secondary | ICD-10-CM

## 2017-07-28 DIAGNOSIS — M8580 Other specified disorders of bone density and structure, unspecified site: Secondary | ICD-10-CM

## 2017-08-12 ENCOUNTER — Encounter (HOSPITAL_COMMUNITY)
Admission: RE | Admit: 2017-08-12 | Discharge: 2017-08-12 | Disposition: A | Discharge status: Home / Self Care | Payer: Medicare Other | Source: Ambulatory Visit | Attending: Surgery | Admitting: Surgery

## 2017-08-12 DIAGNOSIS — E213 Hyperparathyroidism, unspecified: Secondary | ICD-10-CM | POA: Insufficient documentation

## 2017-08-12 DIAGNOSIS — M8580 Other specified disorders of bone density and structure, unspecified site: Secondary | ICD-10-CM

## 2017-08-12 MED ORDER — TECHNETIUM TC 99M SESTAMIBI GENERIC - CARDIOLITE
23.4000 | Freq: Once | INTRAVENOUS | Status: AC | PRN
  Administered 2017-08-12: 23.4 via INTRAVENOUS

## 2017-08-13 ENCOUNTER — Other Ambulatory Visit: Payer: Self-pay | Admitting: Internal Medicine

## 2017-09-16 ENCOUNTER — Other Ambulatory Visit: Payer: Self-pay | Admitting: Surgery

## 2017-09-16 DIAGNOSIS — E210 Primary hyperparathyroidism: Secondary | ICD-10-CM

## 2017-09-16 DIAGNOSIS — M8580 Other specified disorders of bone density and structure, unspecified site: Secondary | ICD-10-CM

## 2017-09-28 ENCOUNTER — Encounter: Payer: Self-pay | Admitting: Radiology

## 2017-09-28 ENCOUNTER — Ambulatory Visit
Admission: RE | Admit: 2017-09-28 | Discharge: 2017-09-28 | Disposition: A | Discharge status: Home / Self Care | Payer: Medicare Other | Source: Ambulatory Visit | Attending: Surgery | Admitting: Surgery

## 2017-09-28 DIAGNOSIS — M8580 Other specified disorders of bone density and structure, unspecified site: Secondary | ICD-10-CM

## 2017-09-28 DIAGNOSIS — E210 Primary hyperparathyroidism: Secondary | ICD-10-CM | POA: Diagnosis not present

## 2017-09-28 MED ORDER — IOPAMIDOL (ISOVUE-300) INJECTION 61%
75.0000 mL | Freq: Once | INTRAVENOUS | Status: AC | PRN
  Administered 2017-09-28: 75 mL via INTRAVENOUS

## 2017-11-03 DIAGNOSIS — Z01419 Encounter for gynecological examination (general) (routine) without abnormal findings: Secondary | ICD-10-CM | POA: Diagnosis not present

## 2017-11-16 ENCOUNTER — Other Ambulatory Visit: Payer: Self-pay | Admitting: Physician Assistant

## 2017-11-25 ENCOUNTER — Encounter: Payer: Self-pay | Admitting: Gastroenterology

## 2017-11-25 ENCOUNTER — Ambulatory Visit (INDEPENDENT_AMBULATORY_CARE_PROVIDER_SITE_OTHER): Payer: Medicare Other | Admitting: Gastroenterology

## 2017-11-25 VITALS — BP 100/60 | HR 81 | Ht 63.0 in

## 2017-11-25 DIAGNOSIS — K5909 Other constipation: Secondary | ICD-10-CM

## 2017-11-25 NOTE — Patient Instructions (Signed)
We have sent the following medications to your pharmacy for you to pick up at your convenience: Amitiza 8 mcg twice a day with food, start once a day for 5-7 days then increase to twice a day if needed.   Please call the office with an update of your symptoms in the next 3-4 weeks and ask for Koren Shiver, RN, BSN.

## 2017-11-25 NOTE — Progress Notes (Signed)
11/25/2017 Evelyn Murphy [id number] [birth year]   HISTORY OF PRESENT ILLNESS: This is a 72 year old female who is a patient of Dr. Celesta Aver.  She was seen by myself back in November 2017 with complaints of constipation.  At that time I placed her on Linzess 72 mcg daily.  When she saw Dr. Carlean Purl in March 2018 she reported that she was using it as needed since taking it daily was giving her diarrhea.  She is here today wanting to know what else she can take for her constipation.  Taking MiraLAX on a daily basis gives her diarrhea, but then taking it every other day or every few days does not seem to help.  Last colonoscopy was 11/2009 with Dr. Carlean Purl at which time the study was normal with repeat recommended in 10 years from that time.   Past Medical History:  Diagnosis Date  . Anxiety   . Asthma   . Confusion 2016   admitted - ? TIA - NOT - was overmedication issue  . Depression   . Hyperlipidemia   . Insomnia   . RBBB    Past Surgical History:  Procedure Laterality Date  . COLONOSCOPY    . SHOULDER ARTHROSCOPY  10/2011   R Dr Meyer Cory    reports that she has never smoked. She has never used smokeless tobacco. She reports that she does not drink alcohol or use drugs. family history includes COPD in her father; Diabetes in her brother and mother; Heart disease in her brother and father; Heart disease (age of onset: 75) in her mother; Macular degeneration in her brother and mother. Allergies  Allergen Reactions  . Linzess [Linaclotide]     Too strong  . Lipitor [Atorvastatin]     elev LFTs      Outpatient Encounter Medications as of 11/25/2017  Medication Sig  . aspirin EC 81 MG tablet Take 1 tablet (81 mg total) by mouth daily.  . Cholecalciferol (VITAMIN D3) 2000 UNITS capsule Take 1 capsule (2,000 Units total) by mouth daily.  . citalopram (CELEXA) 40 MG tablet Take 1 tablet (40 mg total) by mouth daily.  Marland Kitchen doxepin (SINEQUAN) 75 MG capsule TAKE 1-2 CAPSULES (75-150 MG  TOTAL) BY MOUTH AT BEDTIME.  Marland Kitchen ezetimibe (ZETIA) 10 MG tablet TAKE 1 TABLET BY MOUTH DAILY  . Fluticasone Furoate (ARNUITY ELLIPTA) 100 MCG/ACT AEPB Inhale 1 puff into the lungs daily.  . Multiple Vitamins-Minerals (MULTIVITAMIN WITH MINERALS) tablet Take 1 tablet by mouth daily.   . pantoprazole (PROTONIX) 40 MG tablet Take 1 tablet (40 mg total) by mouth daily before breakfast.  . pravastatin (PRAVACHOL) 20 MG tablet Take 1 tablet (20 mg total) by mouth daily.  Marland Kitchen UNABLE TO FIND Med Name: Valerian Root 2 per day  . VENTOLIN HFA 108 (90 Base) MCG/ACT inhaler Inhale 2 puffs into the lungs every 6 (six) hours as needed.   . zolpidem (AMBIEN) 10 MG tablet Take 1 tablet (10 mg total) by mouth at bedtime.  . [DISCONTINUED] VOLTAREN 1 % GEL Apply 2 g topically 4 (four) times daily. (Patient taking differently: Apply 2 g topically as needed. )   Facility-Administered Encounter Medications as of 11/25/2017  Medication  . 0.9 %  sodium chloride infusion     REVIEW OF SYSTEMS  : All other systems reviewed and negative except where noted in the History of Present Illness.   PHYSICAL EXAM: BP 100/60   Pulse 81   Ht 5\' 3"  (1.6 m)  BMI 24.80 kg/m  General: Well developed white female in no acute distress Head: Normocephalic and atraumatic Eyes:  Sclerae anicteric, conjunctiva pink. Ears: Normal auditory acuity Lungs: Clear throughout to auscultation; no increased WOB. Heart: Regular rate and rhythm; no M/R/G. Abdomen: Soft, non-distended.  BS present.  Non-tender. Musculoskeletal: Symmetrical with no gross deformities  Skin: No lesions on visible extremities Extremities: No edema  Neurological: Alert oriented x 4, grossly non-focal Psychological:  Alert and cooperative. Normal mood and affect  ASSESSMENT AND PLAN: *Long-standing constipation:  Complains of severe constipation but then most things, even Miralax daily, gives her diarrhea.  Will try Amitiza 8 mcg once daily with food and  increase to BID if needed.  Will call back in 3-4 weeks with update.  May need to add a daily powdered fiber supplement such as Benefiber or Citrucel as well into her regimen to help with bulk.   CC:  Plotnikov, [name], MD

## 2017-11-28 DIAGNOSIS — Z1231 Encounter for screening mammogram for malignant neoplasm of breast: Secondary | ICD-10-CM | POA: Diagnosis not present

## 2017-11-30 ENCOUNTER — Telehealth: Payer: Self-pay | Admitting: Gastroenterology

## 2017-11-30 NOTE — Telephone Encounter (Signed)
Left message on patients voicemail to call office.   

## 2017-12-01 MED ORDER — LUBIPROSTONE 8 MCG PO CAPS: 8.0000 ug | ORAL_CAPSULE | Freq: Two times a day (BID) | ORAL | 2 refills | Status: DC

## 2017-12-01 NOTE — Telephone Encounter (Signed)
Spoke to patient and informed her she can pick up some samples and that she is ok to increase medication per Janett Billow Zehr's last note. Will send in new prescription.

## 2017-12-07 DIAGNOSIS — Z23 Encounter for immunization: Secondary | ICD-10-CM | POA: Diagnosis not present

## 2017-12-09 ENCOUNTER — Other Ambulatory Visit: Payer: Self-pay | Admitting: *Deleted

## 2017-12-09 ENCOUNTER — Telehealth: Payer: Self-pay | Admitting: Gastroenterology

## 2017-12-09 MED ORDER — LINACLOTIDE 72 MCG PO CAPS: 72.0000 ug | ORAL_CAPSULE | Freq: Every day | ORAL | 2 refills | Status: DC

## 2017-12-09 NOTE — Telephone Encounter (Signed)
Advised the patient she can call me if she has any questions about the directions.

## 2017-12-09 NOTE — Telephone Encounter (Signed)
LM for the patient that we will send a prescription for Linzess 72 mcg, take 1 tablet daily. Also get Benefiber or Citrucel, take 2 tespoons daily in water or juice, lemonade, gatorade etc. Advised her to call me if she has

## 2017-12-13 ENCOUNTER — Other Ambulatory Visit: Payer: Self-pay | Admitting: *Deleted

## 2017-12-13 DIAGNOSIS — I6523 Occlusion and stenosis of bilateral carotid arteries: Secondary | ICD-10-CM

## 2017-12-22 DIAGNOSIS — E210 Primary hyperparathyroidism: Secondary | ICD-10-CM | POA: Diagnosis not present

## 2018-01-08 ENCOUNTER — Other Ambulatory Visit: Payer: Self-pay | Admitting: Internal Medicine

## 2018-01-10 ENCOUNTER — Other Ambulatory Visit: Payer: Self-pay | Admitting: Internal Medicine

## 2018-01-12 ENCOUNTER — Other Ambulatory Visit: Payer: Self-pay | Admitting: Internal Medicine

## 2018-01-18 ENCOUNTER — Ambulatory Visit: Payer: Medicare Other | Admitting: Internal Medicine

## 2018-02-01 ENCOUNTER — Other Ambulatory Visit: Payer: Self-pay | Admitting: *Deleted

## 2018-02-01 MED ORDER — LUBIPROSTONE 8 MCG PO CAPS: 8.0000 ug | ORAL_CAPSULE | Freq: Two times a day (BID) | ORAL | 2 refills | Status: DC

## 2018-02-09 DIAGNOSIS — H2513 Age-related nuclear cataract, bilateral: Secondary | ICD-10-CM | POA: Diagnosis not present

## 2018-02-22 ENCOUNTER — Encounter: Payer: Self-pay | Admitting: Internal Medicine

## 2018-02-22 ENCOUNTER — Ambulatory Visit (INDEPENDENT_AMBULATORY_CARE_PROVIDER_SITE_OTHER): Payer: Medicare Other | Admitting: Internal Medicine

## 2018-02-22 DIAGNOSIS — F418 Other specified anxiety disorders: Secondary | ICD-10-CM | POA: Diagnosis not present

## 2018-02-22 DIAGNOSIS — I6523 Occlusion and stenosis of bilateral carotid arteries: Secondary | ICD-10-CM

## 2018-02-22 DIAGNOSIS — K219 Gastro-esophageal reflux disease without esophagitis: Secondary | ICD-10-CM | POA: Diagnosis not present

## 2018-02-22 DIAGNOSIS — J4530 Mild persistent asthma, uncomplicated: Secondary | ICD-10-CM

## 2018-02-22 DIAGNOSIS — E785 Hyperlipidemia, unspecified: Secondary | ICD-10-CM

## 2018-02-22 DIAGNOSIS — F5101 Primary insomnia: Secondary | ICD-10-CM | POA: Diagnosis not present

## 2018-02-22 MED ORDER — ZOLPIDEM TARTRATE ER 12.5 MG PO TBCR: 12.5000 mg | EXTENDED_RELEASE_TABLET | Freq: Every evening | ORAL | 5 refills | Status: DC | PRN

## 2018-02-22 NOTE — Assessment & Plan Note (Signed)
Worse. Will switch to Ambien CR °

## 2018-02-22 NOTE — Patient Instructions (Signed)
Ask Dr Acie Fredrickson if you need a Cardiac CT calcium scoring test $150:   Computed tomography, more commonly known as a CT or CAT scan, is a diagnostic medical imaging test. Like traditional x-rays, it produces multiple images or pictures of the inside of the body. The cross-sectional images generated during a CT scan can be reformatted in multiple planes. They can even generate three-dimensional images. These images can be viewed on a computer monitor, printed on film or by a 3D printer, or transferred to a CD or DVD. CT images of internal organs, bones, soft tissue and blood vessels provide greater detail than traditional x-rays, particularly of soft tissues and blood vessels. A cardiac CT scan for coronary calcium is a non-invasive way of obtaining information about the presence, location and extent of calcified plaque in the coronary arteries-the vessels that supply oxygen-containing blood to the heart muscle. Calcified plaque results when there is a build-up of fat and other substances under the inner layer of the artery. This material can calcify which signals the presence of atherosclerosis, a disease of the vessel wall, also called coronary artery disease (CAD). People with this disease have an increased risk for heart attacks. In addition, over time, progression of plaque build up (CAD) can narrow the arteries or even close off blood flow to the heart. The result may be chest pain, sometimes called "angina," or a heart attack. Because calcium is a marker of CAD, the amount of calcium detected on a cardiac CT scan is a helpful prognostic tool. The findings on cardiac CT are expressed as a calcium score. Another name for this test is coronary artery calcium scoring.  What are some common uses of the procedure? The goal of cardiac CT scan for calcium scoring is to determine if CAD is present and to what extent, even if there are no symptoms. It is a screening study that may be recommended by a physician  for patients with risk factors for CAD but no clinical symptoms. The major risk factors for CAD are: . high blood cholesterol levels  . family history of heart attacks  . diabetes  . high blood pressure  . cigarette smoking  . overweight or obese  . physical inactivity   A negative cardiac CT scan for calcium scoring shows no calcification within the coronary arteries. This suggests that CAD is absent or so minimal it cannot be seen by this technique. The chance of having a heart attack over the next two to five years is very low under these circumstances. A positive test means that CAD is present, regardless of whether or not the patient is experiencing any symptoms. The amount of calcification-expressed as the calcium score-may help to predict the likelihood of a myocardial infarction (heart attack) in the coming years and helps your medical doctor or cardiologist decide whether the patient may need to take preventive medicine or undertake other measures such as diet and exercise to lower the risk for heart attack. The extent of CAD is graded according to your calcium score:  Calcium Score  Presence of CAD  0 No evidence of CAD   1-10 Minimal evidence of CAD  11-100 Mild evidence of CAD  101-400 Moderate evidence of CAD  Over 400 Extensive evidence of CAD

## 2018-02-22 NOTE — Assessment & Plan Note (Signed)
Citalopram 

## 2018-02-22 NOTE — Assessment & Plan Note (Addendum)
Ask Dr Acie Fredrickson if you need a Cardiac CT calcium scoring test $150 Pravastatin po

## 2018-02-22 NOTE — Progress Notes (Signed)
Subjective:  Patient ID: Evelyn Murphy, female    DOB: [birth year]  Age: 72 y.o. MRN: [id number]  CC: No chief complaint on file.   HPI Evelyn Murphy presents for insomnia - worse, dyslipidemia, depression f/u  Outpatient Medications Prior to Visit  Medication Sig Dispense Refill  . aspirin EC 81 MG tablet Take 1 tablet (81 mg total) by mouth daily. 30 tablet 0  . Cholecalciferol (VITAMIN D3) 2000 UNITS capsule Take 1 capsule (2,000 Units total) by mouth daily. 100 capsule 3  . citalopram (CELEXA) 40 MG tablet TAKE 1 TABLET BY MOUTH EVERY DAY 90 tablet 1  . doxepin (SINEQUAN) 75 MG capsule TAKE 1-2 CAPSULES (75-150 MG TOTAL) BY MOUTH AT BEDTIME. 180 capsule 1  . ezetimibe (ZETIA) 10 MG tablet TAKE 1 TABLET BY MOUTH DAILY 90 tablet 3  . Fluticasone Furoate (ARNUITY ELLIPTA) 100 MCG/ACT AEPB Inhale 1 puff into the lungs daily. 30 each 5  . linaclotide (LINZESS) 72 MCG capsule Take 1 capsule (72 mcg total) by mouth daily before breakfast. 30 capsule 2  . lubiprostone (AMITIZA) 8 MCG capsule Take 1 capsule (8 mcg total) by mouth 2 (two) times daily with a meal. 60 capsule 2  . Multiple Vitamins-Minerals (MULTIVITAMIN WITH MINERALS) tablet Take 1 tablet by mouth daily.     . pantoprazole (PROTONIX) 40 MG tablet Take 1 tablet (40 mg total) by mouth daily before breakfast. 90 tablet 3  . pravastatin (PRAVACHOL) 20 MG tablet TAKE 1 TABLET BY MOUTH EVERY DAY 90 tablet 3  . UNABLE TO FIND Med Name: Valerian Root 2 per day    . VENTOLIN HFA 108 (90 Base) MCG/ACT inhaler Inhale 2 puffs into the lungs every 6 (six) hours as needed.     . zolpidem (AMBIEN) 10 MG tablet Take 1 tablet (10 mg total) by mouth at bedtime. 90 tablet 1   Facility-Administered Medications Prior to Visit  Medication Dose Route Frequency Provider Last Rate Last Dose  . 0.9 %  sodium chloride infusion  500 mL Intravenous Continuous Gatha Mayer, MD        ROS: Review of Systems  Constitutional: Negative for activity  change, appetite change, chills, fatigue and unexpected weight change.  HENT: Negative for congestion, mouth sores and sinus pressure.   Eyes: Negative for visual disturbance.  Respiratory: Negative for cough and chest tightness.   Gastrointestinal: Negative for abdominal pain and nausea.  Genitourinary: Negative for difficulty urinating, frequency and vaginal pain.  Musculoskeletal: Negative for back pain and gait problem.  Skin: Negative for pallor and rash.  Neurological: Negative for dizziness, tremors, weakness, numbness and headaches.  Psychiatric/Behavioral: Negative for confusion, sleep disturbance and suicidal ideas.    Objective:  BP 116/74 (BP Location: Left Arm, Patient Position: Sitting, Cuff Size: Normal)   Pulse 69   Temp 98 F (36.7 C) (Oral)   Ht 5\' 3"  (1.6 m)   SpO2 96%   BMI 24.80 kg/m   BP Readings from Last 3 Encounters:  02/22/18 116/74  11/25/17 100/60  07/18/17 114/72    Wt Readings from Last 3 Encounters:  04/05/17 140 lb (63.5 kg)  02/14/17 140 lb (63.5 kg)  06/28/16 145 lb (65.8 kg)    Physical Exam  Constitutional: She appears well-developed. No distress.  HENT:  Head: Normocephalic.  Right Ear: External ear normal.  Left Ear: External ear normal.  Nose: Nose normal.  Mouth/Throat: Oropharynx is clear and moist.  Eyes: Pupils are equal, round, and reactive to  light. Conjunctivae are normal. Right eye exhibits no discharge. Left eye exhibits no discharge.  Neck: Normal range of motion. Neck supple. No JVD present. No tracheal deviation present. No thyromegaly present.  Cardiovascular: Normal rate, regular rhythm and normal heart sounds.  Pulmonary/Chest: No stridor. No respiratory distress. She has no wheezes.  Abdominal: Soft. Bowel sounds are normal. She exhibits no distension and no mass. There is no tenderness. There is no rebound and no guarding.  Musculoskeletal: She exhibits no edema or tenderness.  Lymphadenopathy:    She has no  cervical adenopathy.  Neurological: She displays normal reflexes. No cranial nerve deficit. She exhibits normal muscle tone. Coordination normal.  Skin: No rash noted. No erythema.  Psychiatric: She has a normal mood and affect. Her behavior is normal. Judgment and thought content normal.    Lab Results  Component Value Date   WBC 5.6 04/03/2015   HGB 13.7 04/03/2015   HCT 41.7 04/03/2015   PLT 329.0 04/03/2015   GLUCOSE 110 (H) 03/03/2017   CHOL 171 07/18/2017   TRIG 74.0 07/18/2017   HDL 63.30 07/18/2017   LDLDIRECT 135.5 01/24/2013   LDLCALC 93 07/18/2017   ALT 13 07/18/2017   AST 17 07/18/2017   NA 144 03/03/2017   K 4.6 03/03/2017   CL 102 03/03/2017   CREATININE 1.03 (H) 03/03/2017   BUN 27 03/03/2017   CO2 29 03/03/2017   TSH 1.59 08/25/2015   INR 0.96 06/29/2014   HGBA1C 6.2 (H) 06/29/2014    Ct Parathyroid 4d Neck W/wo  Result Date: 09/28/2017 CLINICAL DATA:  72 year old female with primary hyperparathyroidism. Hypercalcemia. Negative sestamibi scan. EXAM: CT NECK WITH AND WITHOUT CONTRAST TECHNIQUE: Multidetector CT imaging of the neck was performed without and with intravenous contrast. CONTRAST:  47mL ISOVUE-300 IOPAMIDOL (ISOVUE-300) INJECTION 61% COMPARISON:  Nuclear medicine sestamibi scan 08/12/2017. FINDINGS: Study is mild mild motion artifact despite repeated imaging attempts. Pharynx and larynx: Laryngeal and visible pharyngeal soft tissue contours are normal. Negative visible parapharyngeal spaces. The retropharyngeal space is normal aside from a partially retropharyngeal course of the left CCA (normal variant). Salivary glands: Negative sublingual space. Submandibular glands and submandibular spaces are within normal limits. Visible parotid spaces and glands are symmetric and within normal limits. Thyroid: The thyroid is within normal limits. No abnormal enhancing nodule is identified to strongly suggest a pathologic parathyroid adenoma. A marked area of clinical  concern on series 5, image 36 along the left lower neck overlies the left sternocleidomastoid muscle at the level of the thyroid cartilage. There is no underlying soft tissue mass or abnormality. Lymph nodes: Within normal limits. Occasional cervical lymph nodes measure up to 8 millimeters diameter but demonstrate maintained normal morphology and/or fatty hilum. Vascular: There is a persistent left side SVC (normal variant. Major vascular structures in the neck are patent. No significant atherosclerosis. 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 Mild levoconvex upper thoracic spine curvature. No acute osseous abnormality identified. Upper chest: Persistent left side SVC (normal variant). No superior mediastinal lymphadenopathy or enhancing soft tissue nodule suggestive of parathyroid adenoma. Mild respiratory motion artifact through the upper lungs which otherwise appear negative. Visible axillary lymph nodes are normal. IMPRESSION: 1. No parathyroid adenoma identified by Multiphase CT. 2. Normal thyroid.  No neck mass or lymphadenopathy. 3. Persistent left side SVC, a normal anatomic variant. Electronically Signed   By: Genevie Ann M.D.   On: 09/28/2017 09:35    Assessment & Plan:   There are no diagnoses linked  to this encounter.   No orders of the defined types were placed in this encounter.    Follow-up: No follow-ups on file.  Walker Kehr, MD

## 2018-02-22 NOTE — Assessment & Plan Note (Signed)
CBC

## 2018-02-22 NOTE — Assessment & Plan Note (Signed)
Protonix.  ?

## 2018-02-22 NOTE — Assessment & Plan Note (Signed)
Albuterol - Ventolin prn

## 2018-03-06 ENCOUNTER — Other Ambulatory Visit (INDEPENDENT_AMBULATORY_CARE_PROVIDER_SITE_OTHER): Payer: Medicare Other

## 2018-03-06 ENCOUNTER — Encounter: Payer: Self-pay | Admitting: Family Medicine

## 2018-03-06 ENCOUNTER — Ambulatory Visit (INDEPENDENT_AMBULATORY_CARE_PROVIDER_SITE_OTHER): Payer: Medicare Other | Admitting: Family Medicine

## 2018-03-06 VITALS — BP 124/84 | HR 83 | Resp 16

## 2018-03-06 DIAGNOSIS — R05 Cough: Secondary | ICD-10-CM

## 2018-03-06 DIAGNOSIS — R059 Cough, unspecified: Secondary | ICD-10-CM

## 2018-03-06 DIAGNOSIS — E785 Hyperlipidemia, unspecified: Secondary | ICD-10-CM | POA: Diagnosis not present

## 2018-03-06 LAB — LIPID PANEL
CHOLESTEROL: 190 mg/dL (ref 0–200)
HDL: 73.5 mg/dL (ref >39.00)
LDL Cholesterol: 96 mg/dL (ref 0–99)
NonHDL: 116.09
TRIGLYCERIDES: 100 mg/dL (ref 0.0–149.0)
Total CHOL/HDL Ratio: 3
VLDL: 20 mg/dL (ref 0.0–40.0)

## 2018-03-06 LAB — BASIC METABOLIC PANEL
BUN: 23 mg/dL (ref 6–23)
CALCIUM: 10.8 mg/dL — AB (ref 8.4–10.5)
CO2: 29 mEq/L (ref 19–32)
Chloride: 98 mEq/L (ref 96–112)
Creatinine, Ser: 0.97 mg/dL (ref 0.40–1.20)
GFR: 60.25 mL/min (ref >60.00)
GLUCOSE: 105 mg/dL — AB (ref 70–99)
Potassium: 4.3 mEq/L (ref 3.5–5.1)
SODIUM: 137 meq/L (ref 135–145)

## 2018-03-06 LAB — HEPATIC FUNCTION PANEL
ALT: 26 U/L (ref 0–35)
AST: 26 U/L (ref 0–37)
Albumin: 4.8 g/dL (ref 3.5–5.2)
Alkaline Phosphatase: 109 U/L (ref 39–117)
BILIRUBIN TOTAL: 0.4 mg/dL (ref 0.2–1.2)
Bilirubin, Direct: 0.1 mg/dL (ref 0.0–0.3)
Total Protein: 8.2 g/dL (ref 6.0–8.3)

## 2018-03-06 NOTE — Patient Instructions (Signed)
Nice to meet you  Please try things such as zyrtec-D or allegra-D which is an antihistamine and decongestant.  Honey, vick's vapor rub and lozenges can help with a cough  Please give me a call back if your symptoms are not improving after 7-10 days

## 2018-03-06 NOTE — Progress Notes (Signed)
Evelyn Murphy - 72 y.o. female MRN [id number]  Date of birth: January 24, 1946  SUBJECTIVE:  Including CC & ROS.  No chief complaint on file.   Evelyn Murphy is a 72 y.o. female that is presenting with cough.  This is been ongoing for 5 days.  Denies any fevers or chills.  Denies any exposure to someone with similar symptoms.  Has not had any traveling.  Cough seems to be worse at night.  Reports a history of asthma.  Has been using her inhaler with limited improvement.  No history of tobacco use.    Review of Systems  Constitutional: Negative for fever.  HENT: Negative for congestion.   Respiratory: Positive for cough.   Cardiovascular: Negative for chest pain.  Gastrointestinal: Negative for abdominal pain.    HISTORY: Past Medical, Surgical, Social, and Family History Reviewed & Updated per EMR.   Pertinent Historical Findings include:  Past Medical History:  Diagnosis Date  . Anxiety   . Asthma   . Confusion 2016   admitted - ? TIA - NOT - was overmedication issue  . Depression   . Hyperlipidemia   . Insomnia   . RBBB     Past Surgical History:  Procedure Laterality Date  . COLONOSCOPY    . SHOULDER ARTHROSCOPY  10/2011   R Dr Meyer Cory    Allergies  Allergen Reactions  . Linzess [Linaclotide]     Too strong  . Lipitor [Atorvastatin]     elev LFTs    Family History  Problem Relation Age of Onset  . Heart disease Mother 72       CABG  . Diabetes Mother   . Macular degeneration Mother   . Heart disease Father        Father died of MI at 62  . COPD Father   . Heart disease Brother   . Diabetes Brother   . Macular degeneration Brother      Social History   Socioeconomic History  . Marital status: Married    [name]  . Number of children: 2  . Years of education: Not on file  . Highest education level: Not on file  Occupational History  . Occupation: Retired  Scientific laboratory technician  . Financial resource strain: Not hard at all  . Food  insecurity:    Worry: Never true    Inability: Never true  . Transportation needs:    Medical: No    Non-medical: No  Tobacco Use  . Smoking status: Never Smoker  . Smokeless tobacco: Never Used  Substance and Sexual Activity  . Alcohol use: No    Alcohol/week: 0.0 standard drinks    Frequency: Never  . Drug use: No  . Sexual activity: Yes  Lifestyle  . Physical activity:    Days per week: 5 days    Minutes per session: 60 min  . Stress: Only a little  Relationships  . Social connections:    Talks on phone: More than three times a week    Gets together: More than three times a week    Attends religious service: More than 4 times per year    Active member of club or organization: Yes    Attends meetings of clubs or organizations: More than 4 times per year    Relationship status: Married  . Intimate partner violence:    Fear of current or ex partner: No    Emotionally abused: No    Physically abused:  No    Forced sexual activity: No  Other Topics Concern  . Not on file  Social History Narrative   Married retired 2 daughters     PHYSICAL EXAM:  VS: BP 124/84   Pulse 83   Resp 16   SpO2 97%  Physical Exam Gen: NAD, alert, cooperative with exam, well-appearing ENT: normal lips, normal nasal mucosa,  Eye: normal EOM, normal conjunctiva and lids CV:  no edema, +2 pedal pulses, regular rate and rhythm, S1-S2   Resp: no accessory muscle use, non-labored, clear to auscultation bilaterally, no crackles or wheezes Skin: no rashes, no areas of induration  Neuro: normal tone, normal sensation to touch Psych:  normal insight, alert and oriented MSK: Normal gait, normal strength      ASSESSMENT & PLAN:   Cough Seems to be related to viral. Doesn't seem much improvement with inhaler.  - counseled on supportive care - if no improvement consider imaging, prednisone

## 2018-03-06 NOTE — Assessment & Plan Note (Signed)
Seems to be related to viral. Doesn't seem much improvement with inhaler.  - counseled on supportive care - if no improvement consider imaging, prednisone

## 2018-03-13 ENCOUNTER — Ambulatory Visit: Payer: Medicare Other | Admitting: Internal Medicine

## 2018-03-17 ENCOUNTER — Ambulatory Visit (HOSPITAL_COMMUNITY)
Admission: RE | Admit: 2018-03-17 | Discharge: 2018-03-17 | Disposition: A | Discharge status: Home / Self Care | Payer: Medicare Other | Source: Ambulatory Visit | Attending: Cardiovascular Disease | Admitting: Cardiovascular Disease

## 2018-03-17 DIAGNOSIS — I6523 Occlusion and stenosis of bilateral carotid arteries: Secondary | ICD-10-CM | POA: Insufficient documentation

## 2018-04-06 ENCOUNTER — Ambulatory Visit (INDEPENDENT_AMBULATORY_CARE_PROVIDER_SITE_OTHER): Payer: Medicare Other | Admitting: Cardiovascular Disease

## 2018-04-06 ENCOUNTER — Encounter: Payer: Self-pay | Admitting: Cardiovascular Disease

## 2018-04-06 VITALS — BP 108/76 | HR 80 | Ht 63.0 in | Wt 140.0 lb

## 2018-04-06 DIAGNOSIS — E785 Hyperlipidemia, unspecified: Secondary | ICD-10-CM | POA: Diagnosis not present

## 2018-04-06 DIAGNOSIS — I6523 Occlusion and stenosis of bilateral carotid arteries: Secondary | ICD-10-CM | POA: Diagnosis not present

## 2018-04-06 MED ORDER — ROSUVASTATIN CALCIUM 10 MG PO TABS: 10.0000 mg | ORAL_TABLET | Freq: Every day | ORAL | 11 refills | Status: DC

## 2018-04-06 NOTE — Addendum Note (Signed)
Addended by: Emmaline Life on: 04/06/2018 10:01 AM   Modules accepted: Orders

## 2018-04-06 NOTE — Patient Instructions (Signed)
Medication Instructions:  Your physician has recommended you make the following change in your medication:  STOP Pravastatin (Pravachol) START Rosuvastatin (Crestor) 10 mg once daily with your heaviest meal  If you need a refill on your cardiac medications before your next appointment, please call your pharmacy.   Lab work: Your physician recommends that you return for lab work in: 3 monthsYou will need to FAST for this appointment - nothing to eat or drink after midnight the night before except water.    Testing/Procedures: None Ordered   Follow-Up: At [facility], you and your health needs are our priority.  As part of our continuing mission to provide you with exceptional heart care, we have created designated Provider Care Teams.  These Care Teams include your primary Cardiologist (physician) and Advanced Practice Providers (APPs -  Physician Assistants and Nurse Practitioners) who all work together to provide you with the care you need, when you need it. You will need a follow up appointment in:  1 years.  Please call our office 2 months in advance to schedule this appointment.  You may see [name], MD or one of the following Advanced Practice Providers on your designated Care Team: Richardson Dopp, PA-C [place], [place] . Daune Perch, NP

## 2018-04-06 NOTE — Progress Notes (Signed)
Evelyn Murphy Date of Birth  May 08, 1945       [facility]    [employer] [address], [address] [place], [place], [place]  [id number]   [place], [place]  [address] [phone]     [phone]   Fax  [phone]    Fax [phone]  Problem List: 1. Strong family history of coronary artery disease 2. Hyperlipidemia    Evelyn Murphy is a 73 yo with family hx of CAD ( father died at 39 of MI, mother had MI at 78, CABG x 2, brother has had multiple stents).  Both aunts diet of heart disease.  She is concerned about her risks.   She denies any CP or dyspnea.  She exercises every day - 45 minutes on treadmill, 40 minutes on stationary bike,.  She also adds weights 3-4 days a week.    She has eats a low fat diet,   Dec. 30, 2015:  Evelyn Murphy is a 73 yo with hx o hyperlipidemia and a strong family hx of CAD Her LDL partical number has decreased from 200 to 1233.  Jan. 17, 2017: Doing well. Lipids are up a bit - but she is not on Zetia any longer.   Dec. 13, 2017:  Seen in follow up . Had an episode of double vision a month ago.  Saw her eye doctor .  Carotid duplex scan - was found to have mild bilateral plaque Exercising every day  No CP or dyspnea   April 05, 2017:  Doing well.   Exercising daily .  Tolerating the Zetia and pravachol .  Has mild to moderate carotid artery disease.  Her duplex scan from December, 2018 was unchanged from the previous year.  April 06, 2018:  Evelyn Murphy is doing very well.  She is exercising on a daily basis.  She denies any chest pain or shortness of breath. Recent cholesterol levels reveal an LDL of 96.  Her HDL is 73.  The total cholesterol is 190.  The triglyceride level is 100.   Current Outpatient Medications on File Prior to Visit  Medication Sig Dispense Refill  . aspirin EC 81 MG tablet Take 1 tablet (81 mg total) by mouth daily. 30 tablet 0  . Cholecalciferol (VITAMIN D3) 2000 UNITS capsule Take 1  capsule (2,000 Units total) by mouth daily. 100 capsule 3  . citalopram (CELEXA) 40 MG tablet TAKE 1 TABLET BY MOUTH EVERY DAY 90 tablet 1  . doxepin (SINEQUAN) 75 MG capsule TAKE 1-2 CAPSULES (75-150 MG TOTAL) BY MOUTH AT BEDTIME. 180 capsule 1  . ezetimibe (ZETIA) 10 MG tablet TAKE 1 TABLET BY MOUTH DAILY 90 tablet 3  . Fluticasone Furoate (ARNUITY ELLIPTA) 100 MCG/ACT AEPB Inhale 1 puff into the lungs daily. 30 each 5  . linaclotide (LINZESS) 72 MCG capsule Take 1 capsule (72 mcg total) by mouth daily before breakfast. 30 capsule 2  . Multiple Vitamins-Minerals (MULTIVITAMIN WITH MINERALS) tablet Take 1 tablet by mouth daily.     . pantoprazole (PROTONIX) 40 MG tablet Take 1 tablet (40 mg total) by mouth daily before breakfast. 90 tablet 3  . pravastatin (PRAVACHOL) 20 MG tablet TAKE 1 TABLET BY MOUTH EVERY DAY 90 tablet 3  . UNABLE TO FIND Med Name: Valerian Root 2 per day    . VENTOLIN HFA 108 (90 Base) MCG/ACT inhaler Inhale 2 puffs into the lungs every 6 (six) hours as needed.     Evelyn Murphy  zolpidem (AMBIEN CR) 12.5 MG CR tablet Take 1 tablet (12.5 mg total) by mouth at bedtime as needed for sleep. 30 tablet 5   Current Facility-Administered Medications on File Prior to Visit  Medication Dose Route Frequency Provider Last Rate Last Dose  . 0.9 %  sodium chloride infusion  500 mL Intravenous Continuous Gatha Mayer, MD        Allergies  Allergen Reactions  . Linzess [Linaclotide]     Too strong  . Lipitor [Atorvastatin]     elev LFTs    Past Medical History:  Diagnosis Date  . Anxiety   . Asthma   . Confusion 2016   admitted - ? TIA - NOT - was overmedication issue  . Depression   . Hyperlipidemia   . Insomnia   . RBBB     Past Surgical History:  Procedure Laterality Date  . COLONOSCOPY    . SHOULDER ARTHROSCOPY  10/2011   R Dr Meyer Cory    Social History   Tobacco Use  Smoking Status Never Smoker  Smokeless Tobacco Never Used    Social History   Substance and  Sexual Activity  Alcohol Use No  . Alcohol/week: 0.0 standard drinks  . Frequency: Never    Family History  Problem Relation Age of Onset  . Heart disease Mother 66       CABG  . Diabetes Mother   . Macular degeneration Mother   . Heart disease Father        Father died of MI at 7  . COPD Father   . Heart disease Brother   . Diabetes Brother   . Macular degeneration Brother     Reviw of Systems:  Noted in current history.  Otherwise systems are negative.  Physical Exam: Blood pressure 108/76, pulse 80, height 5\' 3"  (1.6 m), weight 140 lb (63.5 kg).  GEN:  Well nourished, well developed in no acute distress HEENT: Normal NECK: No JVD; No carotid bruits LYMPHATICS: No lymphadenopathy CARDIAC: RRR , no murmurs, rubs, gallops RESPIRATORY:  Clear to auscultation without rales, wheezing or rhonchi  ABDOMEN: Soft, non-tender, non-distended MUSCULOSKELETAL:  No edema; No deformity  SKIN: Warm and dry NEUROLOGIC:  Alert and oriented x 3     ECG: April 06, 2018: Normal sinus rhythm at 80 beats minute.  Left  atrial enlargement.  Right bundle branch block.    Assessment / Plan:   1. Strong family history of coronary artery disease  2. Hyperlipidemia-     3. ? TIA -    [name], MD  04/06/2018 9:14 AM    [facility] [place],  [place], [place]  [address] Pager [phone] Phone: [phone]; Fax: [phone]

## 2018-04-13 DIAGNOSIS — M2041 Other hammer toe(s) (acquired), right foot: Secondary | ICD-10-CM | POA: Diagnosis not present

## 2018-04-13 DIAGNOSIS — B351 Tinea unguium: Secondary | ICD-10-CM | POA: Diagnosis not present

## 2018-04-13 DIAGNOSIS — M2042 Other hammer toe(s) (acquired), left foot: Secondary | ICD-10-CM | POA: Diagnosis not present

## 2018-04-14 NOTE — Telephone Encounter (Signed)
Key: [id number]

## 2018-04-21 DIAGNOSIS — B351 Tinea unguium: Secondary | ICD-10-CM | POA: Diagnosis not present

## 2018-05-18 ENCOUNTER — Other Ambulatory Visit: Payer: Self-pay | Admitting: Pulmonary Disease

## 2018-05-18 ENCOUNTER — Other Ambulatory Visit: Payer: Self-pay | Admitting: Cardiovascular Disease

## 2018-05-18 MED ORDER — EZETIMIBE 10 MG PO TABS: 10.0000 mg | ORAL_TABLET | Freq: Every day | ORAL | 3 refills | Status: DC

## 2018-05-18 NOTE — Telephone Encounter (Addendum)
LVM for patient regarding refill of [place] Ellipta 100 Inhaler. X1 Pt hasn't been seen by VS since 02/14/2017 Pt will need to have a f/u appt with NP or VS for additional refills

## 2018-05-19 ENCOUNTER — Telehealth: Payer: Self-pay | Admitting: Pulmonary Disease

## 2018-05-19 NOTE — Telephone Encounter (Signed)
LVM for patient regarding refill of [place] Ellipta 100 Inhaler. X2 Pt hasn't been seen by VS since 02/14/2017 Pt will need to have a f/u appt with NP or VS for additional refills

## 2018-05-19 NOTE — Telephone Encounter (Signed)
lmtcb x1 for pt. 

## 2018-05-19 NOTE — Telephone Encounter (Signed)
ATC Patient.  Left message to call back. Patients last OV was 02/14/2017, with Dr Halford Chessman.

## 2018-05-22 MED ORDER — FLUTICASONE FUROATE 100 MCG/ACT IN AEPB: 1.0000 | INHALATION_SPRAY | Freq: Every day | RESPIRATORY_TRACT | 1 refills | Status: DC

## 2018-05-22 NOTE — Telephone Encounter (Signed)
Patient is returning phone call. Patient phone number is [phone].  Can leave message on her voicemail.

## 2018-05-22 NOTE — Telephone Encounter (Signed)
ATC Patient.  Left message to call back when available.  

## 2018-05-22 NOTE — Telephone Encounter (Signed)
Call made to patient, confirmed appt on 05/30/18. 30 day refill sent until patient is able to be seen in office Voiced understanding. Nothing further is needed at this time.

## 2018-05-24 ENCOUNTER — Other Ambulatory Visit: Payer: Self-pay | Admitting: Surgery

## 2018-05-24 DIAGNOSIS — E042 Nontoxic multinodular goiter: Secondary | ICD-10-CM

## 2018-05-29 NOTE — Telephone Encounter (Signed)
LVM for patient regarding refill of Arnuity Ellipta 100 Inhaler. X3 Pt hasn't been seen by VS since11/26/2018 Pt will need to have a f/u appt with NP or VS for additional refills  Attempted to call patient today regarding results. I have left a voicemail message for pt to return call. X3 We have left three messages for pt to call our office back regarding refill on arnuity. No response at this time, a letter has been placed in mail today for pt to call our office. Mailed letter of communication today. Nothing further needed at this time.

## 2018-05-29 NOTE — Progress Notes (Signed)
@Patient  ID: Evelyn Murphy, female    DOB: [birth year], 73 y.o.   MRN: [id number]  Chief Complaint  Patient presents with  . Medication refill needed    Referring provider: Plotnikov, [name], MD  HPI 73 year old female never smoker with asthma who is followed by Dr. Halford Chessman Maintenance inhaler: Arnuity   Tests: CXR 11/30/16>>No acute cardiopulmonary disease.  Pulmonary tests PFT 02/19/15 >> FEV1 1.88 (81%), FEV1% 86, TLC 4.29 (86%), DLCO 72% FeNO 12/17/15 >> 75  Cardiac tests Echo 06/30/14 >> EF 55 to 40%, grade 1 diastolic dysfx  OV 03/23/70 - Follow up Presents today for a routine follow-up.  She was last seen in our office by Dr. Halford Chessman on 02/14/2017.  She states that she has been doing well since her last visit.  She does not have to use her Ventolin rescue inhaler.  She only uses her Arnuity Ellipta when she feels that she needs it, such as during change of seasons, etc.  She is scheduled to go on a cruise in 2 weeks and would like a note today excusing her from the cruise due to concern over coronavirus.  Patient does need a refill on her [place] today. Denies f/c/s, n/v/d, hemoptysis, PND, leg swelling Denies chest pain or edema    Allergies  Allergen Reactions  . Linzess [Linaclotide]     Too strong  . Lipitor [Atorvastatin]     elev LFTs    Immunization History  Administered Date(s) Administered  . Influenza Whole 12/26/2011  . Influenza, High Dose Seasonal PF 11/17/2016, 12/07/2017  . Influenza,inj,Quad PF,6+ Mos 02/09/2015, 11/10/2015  . Influenza-Unspecified 12/24/2012, 12/04/2013  . Pneumococcal Conjugate-13 01/24/2013  . Pneumococcal Polysaccharide-23 10/03/2014  . Td 10/10/2009  . Zoster Recombinat (Shingrix) 07/07/2017, 12/15/2017    Past Medical History:  Diagnosis Date  . Anxiety   . Asthma   . Confusion 2016   admitted - ? TIA - NOT - was overmedication issue  . Depression   . Hyperlipidemia   . Insomnia   . RBBB     Tobacco  History: Social History   Tobacco Use  Smoking Status Never Smoker  Smokeless Tobacco Never Used   Counseling given: Not Answered   Outpatient Encounter Medications as of 05/30/2018  Medication Sig  . aspirin EC 81 MG tablet Take 1 tablet (81 mg total) by mouth daily.  . Cholecalciferol (VITAMIN D3) 2000 UNITS capsule Take 1 capsule (2,000 Units total) by mouth daily.  . citalopram (CELEXA) 40 MG tablet TAKE 1 TABLET BY MOUTH EVERY DAY  . doxepin (SINEQUAN) 75 MG capsule TAKE 1-2 CAPSULES (75-150 MG TOTAL) BY MOUTH AT BEDTIME.  Marland Kitchen ezetimibe (ZETIA) 10 MG tablet Take 1 tablet (10 mg total) by mouth daily.  . Fluticasone Furoate (ARNUITY ELLIPTA) 100 MCG/ACT AEPB Inhale 1 puff into the lungs daily.  . Multiple Vitamins-Minerals (MULTIVITAMIN WITH MINERALS) tablet Take 1 tablet by mouth daily.   . pantoprazole (PROTONIX) 40 MG tablet Take 1 tablet (40 mg total) by mouth daily before breakfast.  . rosuvastatin (CRESTOR) 10 MG tablet Take 1 tablet (10 mg total) by mouth daily.  Marland Kitchen UNABLE TO FIND Med Name: Valerian Root 2 per day  . VENTOLIN HFA 108 (90 Base) MCG/ACT inhaler Inhale 2 puffs into the lungs every 6 (six) hours as needed.   . [DISCONTINUED] Fluticasone Furoate (ARNUITY ELLIPTA) 100 MCG/ACT AEPB Inhale 2 puffs into the lungs daily.  . [DISCONTINUED] Fluticasone Furoate (ARNUITY ELLIPTA) 100 MCG/ACT AEPB Inhale 1 puff into  the lungs daily.  . [DISCONTINUED] linaclotide (LINZESS) 72 MCG capsule Take 1 capsule (72 mcg total) by mouth daily before breakfast. (Patient not taking: Reported on 05/30/2018)  . [DISCONTINUED] zolpidem (AMBIEN CR) 12.5 MG CR tablet Take 1 tablet (12.5 mg total) by mouth at bedtime as needed for sleep. (Patient not taking: Reported on 05/30/2018)   Facility-Administered Encounter Medications as of 05/30/2018  Medication  . 0.9 %  sodium chloride infusion     Review of Systems  Review of Systems  Constitutional: Negative.  Negative for chills and fever.    HENT: Negative.   Respiratory: Negative for cough, shortness of breath and wheezing.   Cardiovascular: Negative.  Negative for chest pain, palpitations and leg swelling.  Gastrointestinal: Negative.   Allergic/Immunologic: Negative.   Neurological: Negative.   Psychiatric/Behavioral: Negative.        Physical Exam  BP 122/66 (BP Location: Left Arm, Patient Position: Sitting, Cuff Size: Normal)   Pulse 82   SpO2 92%   Wt Readings from Last 5 Encounters:  04/06/18 140 lb (63.5 kg)  04/05/17 140 lb (63.5 kg)  02/14/17 140 lb (63.5 kg)  06/28/16 145 lb (65.8 kg)  05/25/16 145 lb (65.8 kg)     Physical Exam Vitals signs and nursing note reviewed.  Constitutional:      General: She is not in acute distress.    Appearance: She is well-developed.  Cardiovascular:     Rate and Rhythm: Normal rate and regular rhythm.  Pulmonary:     Effort: Pulmonary effort is normal. No respiratory distress.     Breath sounds: Normal breath sounds. No wheezing or rhonchi.  Musculoskeletal:        General: No swelling.  Neurological:     Mental Status: She is alert and oriented to person, place, and time.       Assessment & Plan:   Asthma Patient presents today for routine follow-up.  She has not been seen in our office in the past 2 years.  She states that overall she has been doing well.  She only uses her Arnuity when she feels that she needs it.  Refill today.  Will write a letter for her stating that she can defer her cruise at this time.  Patient Instructions  Will refill arnuity Continue ventolin as needed Stay active  Follow up: Follow up with Dr. Halford Chessman in 1 year or sooner if needed       Fenton Foy, NP 05/30/2018

## 2018-05-30 ENCOUNTER — Encounter: Payer: Self-pay | Admitting: Nurse Practitioner

## 2018-05-30 ENCOUNTER — Encounter: Payer: Self-pay | Admitting: General Surgery

## 2018-05-30 ENCOUNTER — Ambulatory Visit (INDEPENDENT_AMBULATORY_CARE_PROVIDER_SITE_OTHER): Payer: Medicare Other | Admitting: Nurse Practitioner

## 2018-05-30 ENCOUNTER — Ambulatory Visit
Admission: RE | Admit: 2018-05-30 | Discharge: 2018-05-30 | Disposition: A | Discharge status: Home / Self Care | Payer: Medicare Other | Source: Ambulatory Visit | Attending: Surgery | Admitting: Surgery

## 2018-05-30 DIAGNOSIS — I6523 Occlusion and stenosis of bilateral carotid arteries: Secondary | ICD-10-CM | POA: Diagnosis not present

## 2018-05-30 DIAGNOSIS — J4530 Mild persistent asthma, uncomplicated: Secondary | ICD-10-CM | POA: Diagnosis not present

## 2018-05-30 DIAGNOSIS — E210 Primary hyperparathyroidism: Secondary | ICD-10-CM | POA: Diagnosis not present

## 2018-05-30 DIAGNOSIS — E042 Nontoxic multinodular goiter: Secondary | ICD-10-CM

## 2018-05-30 DIAGNOSIS — E213 Hyperparathyroidism, unspecified: Secondary | ICD-10-CM | POA: Diagnosis not present

## 2018-05-30 MED ORDER — FLUTICASONE FUROATE 100 MCG/ACT IN AEPB: 1.0000 | INHALATION_SPRAY | Freq: Every day | RESPIRATORY_TRACT | 1 refills | Status: DC

## 2018-05-30 NOTE — Progress Notes (Signed)
Reviewed and agree with assessment/plan.   Mashonda Broski, MD [facility] 03/17/2016, 12:24 PM Pager:  [phone]  

## 2018-05-30 NOTE — Assessment & Plan Note (Signed)
Patient presents today for routine follow-up.  She has not been seen in our office in the past 2 years.  She states that overall she has been doing well.  She only uses her Arnuity when she feels that she needs it.  Refill today.  Will write a letter for her stating that she can defer her cruise at this time.  Patient Instructions  Will refill arnuity Continue ventolin as needed Stay active  Follow up: Follow up with Dr. Halford Chessman in 1 year or sooner if needed

## 2018-05-30 NOTE — Patient Instructions (Addendum)
Will refill arnuity Continue ventolin as needed Stay active  Follow up: Follow up with Dr. Halford Chessman in 1 year or sooner if needed

## 2018-07-02 ENCOUNTER — Other Ambulatory Visit: Payer: Self-pay | Admitting: Internal Medicine

## 2018-07-04 ENCOUNTER — Telehealth: Payer: Self-pay | Admitting: *Deleted

## 2018-07-04 NOTE — Telephone Encounter (Signed)
LVM for patient to call back in regards to scheduling AWV with our health coach. Stated that virtual visits are being schedule during covid-19 crises and ask that patient call-back nurse to schedule.

## 2018-07-10 ENCOUNTER — Telehealth: Payer: Self-pay | Admitting: *Deleted

## 2018-07-10 NOTE — Telephone Encounter (Signed)
LVM for patient to call back in regards to scheduling AWV with our health coach. Discussed doing a virtual visit and requested that patient call nurse back to schedule.

## 2018-07-22 ENCOUNTER — Other Ambulatory Visit: Payer: Self-pay | Admitting: Internal Medicine

## 2018-08-23 DIAGNOSIS — L602 Onychogryphosis: Secondary | ICD-10-CM | POA: Diagnosis not present

## 2018-08-23 DIAGNOSIS — B351 Tinea unguium: Secondary | ICD-10-CM | POA: Diagnosis not present

## 2018-08-23 DIAGNOSIS — B353 Tinea pedis: Secondary | ICD-10-CM | POA: Diagnosis not present

## 2018-08-29 ENCOUNTER — Other Ambulatory Visit: Payer: Self-pay

## 2018-08-29 ENCOUNTER — Encounter: Payer: Self-pay | Admitting: Internal Medicine

## 2018-08-29 ENCOUNTER — Ambulatory Visit (INDEPENDENT_AMBULATORY_CARE_PROVIDER_SITE_OTHER): Payer: Medicare Other | Admitting: Internal Medicine

## 2018-08-29 VITALS — BP 122/78 | HR 74 | Temp 97.9°F | Ht 63.0 in

## 2018-08-29 DIAGNOSIS — F418 Other specified anxiety disorders: Secondary | ICD-10-CM

## 2018-08-29 DIAGNOSIS — F5101 Primary insomnia: Secondary | ICD-10-CM | POA: Diagnosis not present

## 2018-08-29 DIAGNOSIS — I6523 Occlusion and stenosis of bilateral carotid arteries: Secondary | ICD-10-CM | POA: Diagnosis not present

## 2018-08-29 DIAGNOSIS — E785 Hyperlipidemia, unspecified: Secondary | ICD-10-CM | POA: Diagnosis not present

## 2018-08-29 NOTE — Assessment & Plan Note (Signed)
Citalopram 

## 2018-08-29 NOTE — Assessment & Plan Note (Signed)
Pravastatin  

## 2018-08-29 NOTE — Assessment & Plan Note (Signed)
Doxepine

## 2018-08-29 NOTE — Progress Notes (Signed)
Subjective:  Patient ID: Evelyn Murphy, female    DOB: January 13, 1946  Age: 73 y.o. MRN: [id number]  CC: No chief complaint on file.   HPI TAMKIA TEMPLES presents for insomnia, anxiety, dyslipidemia f/u  Outpatient Medications Prior to Visit  Medication Sig Dispense Refill   aspirin EC 81 MG tablet Take 1 tablet (81 mg total) by mouth daily. 30 tablet 0   Cholecalciferol (VITAMIN D3) 2000 UNITS capsule Take 1 capsule (2,000 Units total) by mouth daily. 100 capsule 3   citalopram (CELEXA) 40 MG tablet TAKE 1 TABLET BY MOUTH EVERY DAY 90 tablet 3   doxepin (SINEQUAN) 75 MG capsule TAKE 1 OR 2 CAPSULES AT BEDTIME 180 capsule 1   ezetimibe (ZETIA) 10 MG tablet Take 1 tablet (10 mg total) by mouth daily. 90 tablet 3   Fluticasone Furoate (ARNUITY ELLIPTA) 100 MCG/ACT AEPB Inhale 1 puff into the lungs daily. 30 each 1   Multiple Vitamins-Minerals (MULTIVITAMIN WITH MINERALS) tablet Take 1 tablet by mouth daily.      pantoprazole (PROTONIX) 40 MG tablet Take 1 tablet (40 mg total) by mouth daily before breakfast. 90 tablet 3   rosuvastatin (CRESTOR) 10 MG tablet Take 1 tablet (10 mg total) by mouth daily. 30 tablet 11   UNABLE TO FIND Med Name: Valerian Root 2 per day     VENTOLIN HFA 108 (90 Base) MCG/ACT inhaler Inhale 2 puffs into the lungs every 6 (six) hours as needed.      Facility-Administered Medications Prior to Visit  Medication Dose Route Frequency Provider Last Rate Last Dose   0.9 %  sodium chloride infusion  500 mL Intravenous Continuous Gatha Mayer, MD        ROS: Review of Systems  Constitutional: Negative for activity change, appetite change, chills, fatigue and unexpected weight change.  HENT: Negative for congestion, mouth sores and sinus pressure.   Eyes: Negative for visual disturbance.  Respiratory: Negative for cough and chest tightness.   Gastrointestinal: Negative for abdominal pain and nausea.  Genitourinary: Negative for difficulty urinating,  frequency and vaginal pain.  Musculoskeletal: Negative for back pain and gait problem.  Skin: Negative for pallor and rash.  Neurological: Negative for dizziness, tremors, weakness, numbness and headaches.  Psychiatric/Behavioral: Negative for confusion, sleep disturbance and suicidal ideas.    Objective:  BP 122/78 (BP Location: Left Arm, Patient Position: Sitting, Cuff Size: Normal)    Pulse 74    Temp 97.9 F (36.6 C) (Oral)    Ht 5\' 3"  (1.6 m)    SpO2 95%    BMI 24.80 kg/m   BP Readings from Last 3 Encounters:  08/29/18 122/78  05/30/18 122/66  04/06/18 108/76    Wt Readings from Last 3 Encounters:  04/06/18 140 lb (63.5 kg)  04/05/17 140 lb (63.5 kg)  02/14/17 140 lb (63.5 kg)    Physical Exam Constitutional:      General: She is not in acute distress.    Appearance: She is well-developed.  HENT:     Head: Normocephalic.     Right Ear: External ear normal.     Left Ear: External ear normal.     Nose: Nose normal.  Eyes:     General:        Right eye: No discharge.        Left eye: No discharge.     Conjunctiva/sclera: Conjunctivae normal.     Pupils: Pupils are equal, round, and reactive to light.  Neck:  Musculoskeletal: Normal range of motion and neck supple.     Thyroid: No thyromegaly.     Vascular: No JVD.     Trachea: No tracheal deviation.  Cardiovascular:     Rate and Rhythm: Normal rate and regular rhythm.     Heart sounds: Normal heart sounds.  Pulmonary:     Effort: No respiratory distress.     Breath sounds: No stridor. No wheezing.  Abdominal:     General: Bowel sounds are normal. There is no distension.     Palpations: Abdomen is soft. There is no mass.     Tenderness: There is no abdominal tenderness. There is no guarding or rebound.  Musculoskeletal:        General: No tenderness.  Lymphadenopathy:     Cervical: No cervical adenopathy.  Skin:    Findings: No erythema or rash.  Neurological:     Cranial Nerves: No cranial nerve  deficit.     Motor: No abnormal muscle tone.     Coordination: Coordination normal.     Deep Tendon Reflexes: Reflexes normal.  Psychiatric:        Behavior: Behavior normal.        Thought Content: Thought content normal.        Judgment: Judgment normal.     Lab Results  Component Value Date   WBC 5.6 04/03/2015   HGB 13.7 04/03/2015   HCT 41.7 04/03/2015   PLT 329.0 04/03/2015   GLUCOSE 105 (H) 03/06/2018   CHOL 190 03/06/2018   TRIG 100.0 03/06/2018   HDL 73.50 03/06/2018   LDLDIRECT 135.5 01/24/2013   LDLCALC 96 03/06/2018   ALT 26 03/06/2018   AST 26 03/06/2018   NA 137 03/06/2018   K 4.3 03/06/2018   CL 98 03/06/2018   CREATININE 0.97 03/06/2018   BUN 23 03/06/2018   CO2 29 03/06/2018   TSH 1.59 08/25/2015   INR 0.96 06/29/2014   HGBA1C 6.2 (H) 06/29/2014    US Thyroid  Result Date: 05/30/2018 CLINICAL DATA:  Thyroid nodules. Previous FNA biopsy of medial left nodule 04/03/2015. EXAM: THYROID ULTRASOUND TECHNIQUE: Ultrasound examination of the thyroid gland and adjacent soft tissues was performed. COMPARISON:  06/28/2017 and previous back to 09/18/2014 FINDINGS: Parenchymal Echotexture: Moderately heterogenous Isthmus: 0.2 cm thickness, previously 0.3 Right lobe: 4.1 x 0.9 x 1.8 cm, previously 4 x 1.8 x 2 Left lobe: 4.4 x 1 x 1.7 cm, previously 4 x 1.2 x 1.5 _________________________________________________________ Estimated total number of nodules >/= 1 cm: 2 Number of spongiform nodules >/=  2 cm not described below (TR1): 0 Number of mixed cystic and solid nodules >/= 1.5 cm not described below ([place]): 0 _________________________________________________________ Nodule # 1: 1.3 x 1.1 x 0.8 cm medial left, previously 1.4 x 1 x 0.7; this was previously biopsied Nodule # 2: Prior biopsy: No Location: Left; Inferior Maximum size: 1.2 cm; Other 2 dimensions: 0.7 x 0.5 cm, previously, 1 x 0.6 x 0.6 cm Composition: solid/almost completely solid (2) Echogenicity: hypoechoic (2)  Shape: not taller-than-wide (0) Margins: ill-defined (0) Echogenic foci: none (0) ACR TI-RADS total points: 4. ACR TI-RADS risk category:  TR4 (4-6 points). Significant change in size (>/= 20% in two dimensions and minimal increase of 2 mm): No Change in features: No Change in ACR TI-RADS risk category: No ACR TI-RADS recommendations: *Given size (>/= 1 - 1.4 cm) and appearance, a follow-up ultrasound in 1 year should be considered based on TI-RADS criteria. _________________________________________________________ Nodule # 3: Prior biopsy:  No Location: Right; Superior Maximum size: 0.5 cm; Other 2 dimensions: 0.4 x 0.3 cm, previously, 0.4 x 0.4 cm Composition: solid/almost completely solid (2) Echogenicity: hypoechoic (2) Shape: taller-than-wide (3) Margins: ill-defined (0) Echogenic foci: none (0) ACR TI-RADS total points: 7. ACR TI-RADS risk category:  TR5 (>/= 7 points). Significant change in size (>/= 20% in two dimensions and minimal increase of 2 mm): No Change in features: No Change in ACR TI-RADS risk category: No ACR TI-RADS recommendations: *Given size (>/= 0.5 - 0.9 cm) and appearance, a follow-up ultrasound in 1 year should be considered based on TI-RADS criteria. _________________________________________________________ IMPRESSION: 1. Normal-sized thyroid with stable bilateral nodules. None meets criteria for biopsy. 2. Recommend annual/biennial ultrasound follow-up of nodules as above, until stability x5 years confirmed. The above is in keeping with the ACR TI-RADS recommendations - J Am Coll Radiol 2017;14:587-595. Electronically Signed   By: Lucrezia Europe M.D.   On: 05/30/2018 12:43    Assessment & Plan:   There are no diagnoses linked to this encounter.   No orders of the defined types were placed in this encounter.    Follow-up: No follow-ups on file.  Walker Kehr, MD

## 2018-08-30 ENCOUNTER — Other Ambulatory Visit: Payer: Medicare Other

## 2018-08-30 DIAGNOSIS — I6523 Occlusion and stenosis of bilateral carotid arteries: Secondary | ICD-10-CM

## 2018-08-30 DIAGNOSIS — E785 Hyperlipidemia, unspecified: Secondary | ICD-10-CM

## 2018-08-31 LAB — BASIC METABOLIC PANEL
BUN/Creatinine Ratio: 19 (ref 12–28)
BUN: 16 mg/dL (ref 8–27)
CO2: 24 mmol/L (ref 20–29)
Calcium: 10 mg/dL (ref 8.7–10.3)
Chloride: 105 mmol/L (ref 96–106)
Creatinine, Ser: 0.83 mg/dL (ref 0.57–1.00)
GFR calc Af Amer: 82 mL/min/{1.73_m2} (ref >59)
GFR calc non Af Amer: 71 mL/min/{1.73_m2} (ref >59)
Glucose: 108 mg/dL — ABNORMAL HIGH (ref 65–99)
Potassium: 4.8 mmol/L (ref 3.5–5.2)
Sodium: 143 mmol/L (ref 134–144)

## 2018-08-31 LAB — HEPATIC FUNCTION PANEL
ALT: 34 IU/L — ABNORMAL HIGH (ref 0–32)
AST: 39 IU/L (ref 0–40)
Albumin: 4.5 g/dL (ref 3.7–4.7)
Alkaline Phosphatase: 92 IU/L (ref 39–117)
Bilirubin Total: 0.4 mg/dL (ref 0.0–1.2)
Bilirubin, Direct: 0.15 mg/dL (ref 0.00–0.40)
Total Protein: 6.5 g/dL (ref 6.0–8.5)

## 2018-08-31 LAB — LIPID PANEL
Chol/HDL Ratio: 2 ratio (ref 0.0–4.4)
Cholesterol, Total: 150 mg/dL (ref 100–199)
HDL: 75 mg/dL (ref >39)
LDL Calculated: 60 mg/dL (ref 0–99)
Triglycerides: 74 mg/dL (ref 0–149)
VLDL Cholesterol Cal: 15 mg/dL (ref 5–40)

## 2018-09-06 DIAGNOSIS — E042 Nontoxic multinodular goiter: Secondary | ICD-10-CM | POA: Diagnosis not present

## 2018-09-29 ENCOUNTER — Ambulatory Visit: Payer: Self-pay | Admitting: *Deleted

## 2018-09-29 NOTE — Telephone Encounter (Signed)
Patient's husband is calling with concerns of exposure for himself and wife. Yolanda Bonine was at the house 7/1 and now is awaiting test results for COVID. Patient was not exposed to grandson as long as her husband- but the grandson was at her home.  Reason for Disposition . [1] COVID-19 EXPOSURE (Close Contact) AND [2] within last 14 days BUT [3] NO symptoms  Answer Assessment - Initial Assessment Questions 1. CLOSE CONTACT: "Who is the person with the confirmed or suspected COVID-19 infection that you were exposed to?"     Suspect COVID- grandson- awaiting results 2. PLACE of CONTACT: "Where were you when you were exposed to COVID-19?" (e.g., home, school, medical waiting room; which city?)     In the home 3. TYPE of CONTACT: "How much contact was there?" (e.g., sitting next to, live in same house, work in same office, same building)     Visit in the home 4. DURATION of CONTACT: "How long were you in contact with the COVID-19 patient?" (e.g., a few seconds, passed by person, a few minutes, live with the patient)     15 minutes 5. DATE of CONTACT: "When did you have contact with a COVID-19 patient?" (e.g., how many days ago)     7/1 6. TRAVEL: "Have you traveled out of the country recently?" If so, "When and where?"     * Also ask about out-of-state travel, since the CDC has identified some high-risk cities for community spread in the [place].     * Note: Travel becomes less relevant if there is widespread community transmission where the patient lives.     No travel 7. COMMUNITY SPREAD: "Are there lots of cases of COVID-19 (community spread) where you live?" (See public health department website, if unsure)       Community spread 8. SYMPTOMS: "Do you have any symptoms?" (e.g., fever, cough, breathing difficulty)     no 9. PREGNANCY OR POSTPARTUM: "Is there any chance you are pregnant?" "When was your last menstrual period?" "Did you deliver in the last 2 weeks?"     n/a 10. HIGH RISK: "Do you have  any heart or lung problems? Do you have a weak immune system?" (e.g., CHF, COPD, asthma, HIV positive, chemotherapy, renal failure, diabetes mellitus, sickle cell anemia)       asthma  Protocols used: CORONAVIRUS (COVID-19) EXPOSURE-A-AH

## 2018-09-29 NOTE — Telephone Encounter (Signed)
Spoke with pt's husband, per Dr Sharlet Salina, pt should isolate herself at home until results come back for grandson. If positive can call Monday and schedule virtual for testing. Advised to contact office if he was to develop symptoms over the weekend.

## 2018-10-12 DIAGNOSIS — Z79899 Other long term (current) drug therapy: Secondary | ICD-10-CM | POA: Diagnosis not present

## 2018-11-08 DIAGNOSIS — R234 Changes in skin texture: Secondary | ICD-10-CM | POA: Diagnosis not present

## 2018-11-08 DIAGNOSIS — M205X2 Other deformities of toe(s) (acquired), left foot: Secondary | ICD-10-CM | POA: Diagnosis not present

## 2018-11-08 DIAGNOSIS — M205X1 Other deformities of toe(s) (acquired), right foot: Secondary | ICD-10-CM | POA: Diagnosis not present

## 2018-11-08 DIAGNOSIS — B353 Tinea pedis: Secondary | ICD-10-CM | POA: Diagnosis not present

## 2018-11-09 DIAGNOSIS — L918 Other hypertrophic disorders of the skin: Secondary | ICD-10-CM | POA: Diagnosis not present

## 2018-11-09 DIAGNOSIS — Z79899 Other long term (current) drug therapy: Secondary | ICD-10-CM | POA: Diagnosis not present

## 2018-11-22 DIAGNOSIS — Z23 Encounter for immunization: Secondary | ICD-10-CM | POA: Diagnosis not present

## 2018-12-13 DIAGNOSIS — Z01419 Encounter for gynecological examination (general) (routine) without abnormal findings: Secondary | ICD-10-CM | POA: Diagnosis not present

## 2018-12-13 DIAGNOSIS — Z1231 Encounter for screening mammogram for malignant neoplasm of breast: Secondary | ICD-10-CM | POA: Diagnosis not present

## 2018-12-13 DIAGNOSIS — E7800 Pure hypercholesterolemia, unspecified: Secondary | ICD-10-CM | POA: Insufficient documentation

## 2019-01-13 ENCOUNTER — Encounter (INDEPENDENT_AMBULATORY_CARE_PROVIDER_SITE_OTHER): Payer: Self-pay

## 2019-01-31 DIAGNOSIS — L821 Other seborrheic keratosis: Secondary | ICD-10-CM | POA: Diagnosis not present

## 2019-02-20 ENCOUNTER — Other Ambulatory Visit: Payer: Self-pay | Admitting: Cardiovascular Disease

## 2019-02-28 ENCOUNTER — Ambulatory Visit: Payer: Medicare Other | Admitting: Internal Medicine

## 2019-03-06 ENCOUNTER — Encounter: Payer: Self-pay | Admitting: Internal Medicine

## 2019-03-06 ENCOUNTER — Other Ambulatory Visit (INDEPENDENT_AMBULATORY_CARE_PROVIDER_SITE_OTHER): Payer: Medicare Other

## 2019-03-06 ENCOUNTER — Other Ambulatory Visit: Payer: Self-pay

## 2019-03-06 ENCOUNTER — Ambulatory Visit (INDEPENDENT_AMBULATORY_CARE_PROVIDER_SITE_OTHER): Payer: Medicare Other | Admitting: Internal Medicine

## 2019-03-06 VITALS — BP 126/72 | HR 77 | Temp 98.0°F | Ht 63.0 in

## 2019-03-06 DIAGNOSIS — K5909 Other constipation: Secondary | ICD-10-CM

## 2019-03-06 DIAGNOSIS — J4530 Mild persistent asthma, uncomplicated: Secondary | ICD-10-CM

## 2019-03-06 DIAGNOSIS — R011 Cardiac murmur, unspecified: Secondary | ICD-10-CM

## 2019-03-06 DIAGNOSIS — E785 Hyperlipidemia, unspecified: Secondary | ICD-10-CM | POA: Diagnosis not present

## 2019-03-06 DIAGNOSIS — I6523 Occlusion and stenosis of bilateral carotid arteries: Secondary | ICD-10-CM | POA: Diagnosis not present

## 2019-03-06 DIAGNOSIS — F5101 Primary insomnia: Secondary | ICD-10-CM

## 2019-03-06 LAB — CBC WITH DIFFERENTIAL/PLATELET
Basophils Absolute: 0 10*3/uL (ref 0.0–0.1)
Basophils Relative: 0.8 % (ref 0.0–3.0)
Eosinophils Absolute: 0.2 10*3/uL (ref 0.0–0.7)
Eosinophils Relative: 2.8 % (ref 0.0–5.0)
HCT: 39.4 % (ref 36.0–46.0)
Hemoglobin: 12.9 g/dL (ref 12.0–15.0)
Lymphocytes Relative: 30.6 % (ref 12.0–46.0)
Lymphs Abs: 1.7 10*3/uL (ref 0.7–4.0)
MCHC: 32.9 g/dL (ref 30.0–36.0)
MCV: 87.9 fl (ref 78.0–100.0)
Monocytes Absolute: 0.3 10*3/uL (ref 0.1–1.0)
Monocytes Relative: 6.1 % (ref 3.0–12.0)
Neutro Abs: 3.4 10*3/uL (ref 1.4–7.7)
Neutrophils Relative %: 59.7 % (ref 43.0–77.0)
Platelets: 263 10*3/uL (ref 150.0–400.0)
RBC: 4.48 Mil/uL (ref 3.87–5.11)
RDW: 13 % (ref 11.5–15.5)
WBC: 5.7 10*3/uL (ref 4.0–10.5)

## 2019-03-06 LAB — URINALYSIS
Bilirubin Urine: NEGATIVE
Hgb urine dipstick: NEGATIVE
Leukocytes,Ua: NEGATIVE
Nitrite: NEGATIVE
Specific Gravity, Urine: >=1.03 — AB (ref 1.000–1.030)
Total Protein, Urine: NEGATIVE
Urine Glucose: NEGATIVE
Urobilinogen, UA: 0.2 (ref 0.0–1.0)
pH: 5.5 (ref 5.0–8.0)

## 2019-03-06 LAB — LIPID PANEL
Cholesterol: 168 mg/dL (ref 0–200)
HDL: 60.9 mg/dL (ref >39.00)
LDL Cholesterol: 80 mg/dL (ref 0–99)
NonHDL: 106.62
Total CHOL/HDL Ratio: 3
Triglycerides: 135 mg/dL (ref 0.0–149.0)
VLDL: 27 mg/dL (ref 0.0–40.0)

## 2019-03-06 LAB — HEPATIC FUNCTION PANEL
ALT: 35 U/L (ref 0–35)
AST: 33 U/L (ref 0–37)
Albumin: 4.4 g/dL (ref 3.5–5.2)
Alkaline Phosphatase: 81 U/L (ref 39–117)
Bilirubin, Direct: 0 mg/dL (ref 0.0–0.3)
Total Bilirubin: 0.4 mg/dL (ref 0.2–1.2)
Total Protein: 7.2 g/dL (ref 6.0–8.3)

## 2019-03-06 LAB — BASIC METABOLIC PANEL
BUN: 22 mg/dL (ref 6–23)
CO2: 29 mEq/L (ref 19–32)
Calcium: 10.1 mg/dL (ref 8.4–10.5)
Chloride: 102 mEq/L (ref 96–112)
Creatinine, Ser: 0.87 mg/dL (ref 0.40–1.20)
GFR: 64.09 mL/min (ref >60.00)
Glucose, Bld: 154 mg/dL — ABNORMAL HIGH (ref 70–99)
Potassium: 4.2 mEq/L (ref 3.5–5.1)
Sodium: 140 mEq/L (ref 135–145)

## 2019-03-06 LAB — TSH: TSH: 2.17 u[IU]/mL (ref 0.35–4.50)

## 2019-03-06 MED ORDER — LINACLOTIDE 290 MCG PO CAPS: 290.0000 ug | ORAL_CAPSULE | Freq: Every day | ORAL | 3 refills | Status: DC

## 2019-03-06 NOTE — Assessment & Plan Note (Signed)
Arnuity

## 2019-03-06 NOTE — Assessment & Plan Note (Signed)
Linzess 

## 2019-03-06 NOTE — Assessment & Plan Note (Signed)
Crestor + Zetia

## 2019-03-06 NOTE — Assessment & Plan Note (Signed)
Cont w/Sinequan and Valerian root

## 2019-03-06 NOTE — Patient Instructions (Signed)
If you have medicare related insurance (such as traditional Medicare, Blue Cross Medicare, United [employer] Medicare, or similar), Please make an appointment at the scheduling desk with Jill, the Wellness Health Coach, for your Wellness visit in this office, which is a benefit with your insurance.  

## 2019-03-06 NOTE — Progress Notes (Signed)
Subjective:  Patient ID: Evelyn Murphy, female    DOB: 30-Jan-1946  Age: 73 y.o. MRN: [id number]  CC: No chief complaint on file.   HPI Evelyn Murphy presents for constipation, depression, insomnia f/u  Outpatient Medications Prior to Visit  Medication Sig Dispense Refill  . aspirin EC 81 MG tablet Take 1 tablet (81 mg total) by mouth daily. 30 tablet 0  . Cholecalciferol (VITAMIN D3) 2000 UNITS capsule Take 1 capsule (2,000 Units total) by mouth daily. 100 capsule 3  . citalopram (CELEXA) 40 MG tablet TAKE 1 TABLET BY MOUTH EVERY DAY 90 tablet 3  . doxepin (SINEQUAN) 75 MG capsule TAKE 1 OR 2 CAPSULES AT BEDTIME 180 capsule 1  . ezetimibe (ZETIA) 10 MG tablet Take 1 tablet (10 mg total) by mouth daily. 90 tablet 3  . Fluticasone Furoate (ARNUITY ELLIPTA) 100 MCG/ACT AEPB Inhale 1 puff into the lungs daily. 30 each 1  . Multiple Vitamins-Minerals (MULTIVITAMIN WITH MINERALS) tablet Take 1 tablet by mouth daily.     . pantoprazole (PROTONIX) 40 MG tablet Take 1 tablet (40 mg total) by mouth daily before breakfast. 90 tablet 3  . rosuvastatin (CRESTOR) 10 MG tablet Take 1 tablet (10 mg total) by mouth daily. Please keep upcoming appt in January with Dr. Acie Fredrickson before anymore refills. Thank you 90 tablet 0  . UNABLE TO FIND Med Name: Valerian Root 2 per day    . VENTOLIN HFA 108 (90 Base) MCG/ACT inhaler Inhale 2 puffs into the lungs every 6 (six) hours as needed.      Facility-Administered Medications Prior to Visit  Medication Dose Route Frequency Provider Last Rate Last Admin  . 0.9 %  sodium chloride infusion  500 mL Intravenous Continuous Gatha Mayer, MD        ROS: Review of Systems  Constitutional: Negative for activity change, appetite change, chills, fatigue and unexpected weight change.  HENT: Negative for congestion, mouth sores and sinus pressure.   Eyes: Negative for visual disturbance.  Respiratory: Negative for cough and chest tightness.   Gastrointestinal:  Negative for abdominal pain and nausea.  Genitourinary: Negative for difficulty urinating, frequency and vaginal pain.  Musculoskeletal: Negative for back pain and gait problem.  Skin: Negative for pallor and rash.  Neurological: Negative for dizziness, tremors, weakness, numbness and headaches.  Psychiatric/Behavioral: Positive for sleep disturbance. Negative for confusion and suicidal ideas. The patient is nervous/anxious.     Objective:  BP 126/72 (BP Location: Left Arm, Patient Position: Sitting, Cuff Size: Normal)   Pulse 77   Temp 98 F (36.7 C) (Oral)   Ht 5\' 3"  (1.6 m)   SpO2 99%   BMI 24.80 kg/m   BP Readings from Last 3 Encounters:  03/06/19 126/72  08/29/18 122/78  05/30/18 122/66    Wt Readings from Last 3 Encounters:  04/06/18 140 lb (63.5 kg)  04/05/17 140 lb (63.5 kg)  02/14/17 140 lb (63.5 kg)    Physical Exam Constitutional:      General: She is not in acute distress.    Appearance: She is well-developed.  HENT:     Head: Normocephalic.     Right Ear: External ear normal.     Left Ear: External ear normal.     Nose: Nose normal.  Eyes:     General:        Right eye: No discharge.        Left eye: No discharge.     Conjunctiva/sclera: Conjunctivae normal.  Pupils: Pupils are equal, round, and reactive to light.  Neck:     Thyroid: No thyromegaly.     Vascular: No JVD.     Trachea: No tracheal deviation.  Cardiovascular:     Rate and Rhythm: Normal rate and regular rhythm.     Heart sounds: Murmur present.  Pulmonary:     Effort: No respiratory distress.     Breath sounds: No stridor. No wheezing.  Abdominal:     General: Bowel sounds are normal. There is no distension.     Palpations: Abdomen is soft. There is no mass.     Tenderness: There is no abdominal tenderness. There is no guarding or rebound.  Musculoskeletal:        General: No tenderness.     Cervical back: Normal range of motion and neck supple.  Lymphadenopathy:      Cervical: No cervical adenopathy.  Skin:    Findings: No erythema or rash.  Neurological:     Mental Status: She is oriented to person, place, and time.     Cranial Nerves: No cranial nerve deficit.     Motor: No abnormal muscle tone.     Coordination: Coordination normal.     Deep Tendon Reflexes: Reflexes normal.  Psychiatric:        Behavior: Behavior normal.        Thought Content: Thought content normal.        Judgment: Judgment normal.     Lab Results  Component Value Date   WBC 5.6 04/03/2015   HGB 13.7 04/03/2015   HCT 41.7 04/03/2015   PLT 329.0 04/03/2015   GLUCOSE 108 (H) 08/30/2018   CHOL 150 08/30/2018   TRIG 74 08/30/2018   HDL 75 08/30/2018   LDLDIRECT 135.5 01/24/2013   LDLCALC 60 08/30/2018   ALT 34 (H) 08/30/2018   AST 39 08/30/2018   NA 143 08/30/2018   K 4.8 08/30/2018   CL 105 08/30/2018   CREATININE 0.83 08/30/2018   BUN 16 08/30/2018   CO2 24 08/30/2018   TSH 1.59 08/25/2015   INR 0.96 06/29/2014   HGBA1C 6.2 (H) 06/29/2014    US THYROID  Result Date: 05/30/2018 CLINICAL DATA:  Thyroid nodules. Previous FNA biopsy of medial left nodule 04/03/2015. EXAM: THYROID ULTRASOUND TECHNIQUE: Ultrasound examination of the thyroid gland and adjacent soft tissues was performed. COMPARISON:  06/28/2017 and previous back to 09/18/2014 FINDINGS: Parenchymal Echotexture: Moderately heterogenous Isthmus: 0.2 cm thickness, previously 0.3 Right lobe: 4.1 x 0.9 x 1.8 cm, previously 4 x 1.8 x 2 Left lobe: 4.4 x 1 x 1.7 cm, previously 4 x 1.2 x 1.5 _________________________________________________________ Estimated total number of nodules >/= 1 cm: 2 Number of spongiform nodules >/=  2 cm not described below (TR1): 0 Number of mixed cystic and solid nodules >/= 1.5 cm not described below ([place]): 0 _________________________________________________________ Nodule # 1: 1.3 x 1.1 x 0.8 cm medial left, previously 1.4 x 1 x 0.7; this was previously biopsied Nodule # 2: Prior  biopsy: No Location: Left; Inferior Maximum size: 1.2 cm; Other 2 dimensions: 0.7 x 0.5 cm, previously, 1 x 0.6 x 0.6 cm Composition: solid/almost completely solid (2) Echogenicity: hypoechoic (2) Shape: not taller-than-wide (0) Margins: ill-defined (0) Echogenic foci: none (0) ACR TI-RADS total points: 4. ACR TI-RADS risk category:  TR4 (4-6 points). Significant change in size (>/= 20% in two dimensions and minimal increase of 2 mm): No Change in features: No Change in ACR TI-RADS risk category: No ACR  TI-RADS recommendations: *Given size (>/= 1 - 1.4 cm) and appearance, a follow-up ultrasound in 1 year should be considered based on TI-RADS criteria. _________________________________________________________ Nodule # 3: Prior biopsy: No Location: Right; Superior Maximum size: 0.5 cm; Other 2 dimensions: 0.4 x 0.3 cm, previously, 0.4 x 0.4 cm Composition: solid/almost completely solid (2) Echogenicity: hypoechoic (2) Shape: taller-than-wide (3) Margins: ill-defined (0) Echogenic foci: none (0) ACR TI-RADS total points: 7. ACR TI-RADS risk category:  TR5 (>/= 7 points). Significant change in size (>/= 20% in two dimensions and minimal increase of 2 mm): No Change in features: No Change in ACR TI-RADS risk category: No ACR TI-RADS recommendations: *Given size (>/= 0.5 - 0.9 cm) and appearance, a follow-up ultrasound in 1 year should be considered based on TI-RADS criteria. _________________________________________________________ IMPRESSION: 1. Normal-sized thyroid with stable bilateral nodules. None meets criteria for biopsy. 2. Recommend annual/biennial ultrasound follow-up of nodules as above, until stability x5 years confirmed. The above is in keeping with the ACR TI-RADS recommendations - J Am Coll Radiol 2017;14:587-595. Electronically Signed   By: Lucrezia Europe M.D.   On: 05/30/2018 12:43    Assessment & Plan:   There are no diagnoses linked to this encounter.   No orders of the defined types were placed in  this encounter.    Follow-up: No follow-ups on file.  Walker Kehr, MD

## 2019-03-13 ENCOUNTER — Other Ambulatory Visit: Payer: Self-pay

## 2019-03-13 ENCOUNTER — Ambulatory Visit (HOSPITAL_COMMUNITY): Payer: Medicare Other | Attending: Cardiology

## 2019-03-13 DIAGNOSIS — R011 Cardiac murmur, unspecified: Secondary | ICD-10-CM | POA: Diagnosis not present

## 2019-03-19 ENCOUNTER — Ambulatory Visit (HOSPITAL_COMMUNITY)
Admission: RE | Admit: 2019-03-19 | Payer: Medicare Other | Source: Ambulatory Visit | Attending: Cardiovascular Disease | Admitting: Cardiovascular Disease

## 2019-03-20 ENCOUNTER — Other Ambulatory Visit: Payer: Self-pay

## 2019-03-20 ENCOUNTER — Ambulatory Visit (HOSPITAL_COMMUNITY)
Admission: RE | Admit: 2019-03-20 | Discharge: 2019-03-20 | Disposition: A | Discharge status: Home / Self Care | Payer: Medicare Other | Source: Ambulatory Visit | Attending: Internal Medicine | Admitting: Internal Medicine

## 2019-03-20 DIAGNOSIS — I6523 Occlusion and stenosis of bilateral carotid arteries: Secondary | ICD-10-CM | POA: Insufficient documentation

## 2019-03-20 DIAGNOSIS — E785 Hyperlipidemia, unspecified: Secondary | ICD-10-CM | POA: Insufficient documentation

## 2019-03-26 ENCOUNTER — Telehealth: Payer: Self-pay | Admitting: Cardiovascular Disease

## 2019-03-26 NOTE — Telephone Encounter (Signed)
New Message  Patient is returning call. Please give patient a call back at [phone].

## 2019-03-26 NOTE — Telephone Encounter (Signed)
Returned call to patient who states she just picked up a new bottle of medication from the pharmacy and would like to discuss with Dr. Acie Fredrickson at office visit on 1/15.  Patient's medication list states rosuvastatin 10 mg not pravastatin.

## 2019-04-06 ENCOUNTER — Other Ambulatory Visit: Payer: Self-pay

## 2019-04-06 ENCOUNTER — Ambulatory Visit (INDEPENDENT_AMBULATORY_CARE_PROVIDER_SITE_OTHER): Payer: Medicare Other | Admitting: Cardiovascular Disease

## 2019-04-06 ENCOUNTER — Encounter: Payer: Self-pay | Admitting: Cardiovascular Disease

## 2019-04-06 VITALS — BP 98/70 | HR 71 | Ht 63.0 in | Wt 135.0 lb

## 2019-04-06 DIAGNOSIS — I779 Disorder of arteries and arterioles, unspecified: Secondary | ICD-10-CM | POA: Diagnosis not present

## 2019-04-06 DIAGNOSIS — E785 Hyperlipidemia, unspecified: Secondary | ICD-10-CM

## 2019-04-06 DIAGNOSIS — I6523 Occlusion and stenosis of bilateral carotid arteries: Secondary | ICD-10-CM

## 2019-04-06 NOTE — Patient Instructions (Signed)

## 2019-04-06 NOTE — Progress Notes (Signed)
Evelyn Murphy Date of Birth  06/06/1945       [facility]    [employer] [address], [address], [place], [place]  [address]   [place], [place]  [address] [phone]     [phone]   Fax  [phone]    Fax [phone]  Problem List: 1. Strong family history of coronary artery disease 2. Hyperlipidemia    Evelyn Murphy is a 74 yo with family hx of CAD ( father died at 73 of MI, mother had MI at 51, CABG x 2, brother has had multiple stents).  Both aunts diet of heart disease.  She is concerned about her risks.   She denies any CP or dyspnea.  She exercises every day - 45 minutes on treadmill, 40 minutes on stationary bike,.  She also adds weights 3-4 days a week.    She has eats a low fat diet,   Dec. 30, 2015:  Evelyn Murphy is a 74 yo with hx o hyperlipidemia and a strong family hx of CAD Her LDL partical number has decreased from 200 to 1233.  Jan. 17, 2017: Doing well. Lipids are up a bit - but she is not on Zetia any longer.   Dec. 13, 2017:  Seen in follow up . Had an episode of double vision a month ago.  Saw her eye doctor .  Carotid duplex scan - was found to have mild bilateral plaque Exercising every day  No CP or dyspnea   April 05, 2017:  Doing well.   Exercising daily .  Tolerating the Zetia and pravachol .  Has mild to moderate carotid artery disease.  Her duplex scan from December, 2018 was unchanged from the previous year.  April 06, 2018:  Evelyn Murphy is doing very well.  She is exercising on a daily basis.  She denies any chest pain or shortness of breath. Recent cholesterol levels reveal an LDL of 96.  Her HDL is 73.  The total cholesterol is 190.  The triglyceride level is 100.  April 06, 2019: Evelyn Murphy is seen today for follow-up of her hyperlipidemia.  She has mild to moderate carotid artery disease by carotid duplex scan in December, 2018. Just fininshed working out.  No cp or dyspena  Wt is 135 lbs     Current Outpatient Medications on File Prior to Visit  Medication Sig Dispense Refill  . aspirin EC 81 MG tablet Take 1 tablet (81 mg total) by mouth daily. 30 tablet 0  . Cholecalciferol (VITAMIN D3) 2000 UNITS capsule Take 1 capsule (2,000 Units total) by mouth daily. 100 capsule 3  . citalopram (CELEXA) 40 MG tablet TAKE 1 TABLET BY MOUTH EVERY DAY 90 tablet 3  . doxepin (SINEQUAN) 75 MG capsule TAKE 1 OR 2 CAPSULES AT BEDTIME 180 capsule 1  . ezetimibe (ZETIA) 10 MG tablet Take 1 tablet (10 mg total) by mouth daily. 90 tablet 3  . Fluticasone Furoate (ARNUITY ELLIPTA) 100 MCG/ACT AEPB Inhale 1 puff into the lungs daily. 30 each 1  . linaclotide (LINZESS) 290 MCG CAPS capsule Take 1 capsule (290 mcg total) by mouth daily. 90 capsule 3  . Multiple Vitamins-Minerals (MULTIVITAMIN WITH MINERALS) tablet Take 1 tablet by mouth daily.     . pantoprazole (PROTONIX) 40 MG tablet Take 1 tablet (40 mg total) by mouth daily before breakfast. 90 tablet 3  . rosuvastatin (CRESTOR) 10 MG tablet Take 1 tablet (10 mg total) by  mouth daily. Please keep upcoming appt in January with Dr. Acie Fredrickson before anymore refills. Thank you 90 tablet 0  . UNABLE TO FIND Med Name: Valerian Root 2 per day    . VENTOLIN HFA 108 (90 Base) MCG/ACT inhaler Inhale 2 puffs into the lungs every 6 (six) hours as needed.      Current Facility-Administered Medications on File Prior to Visit  Medication Dose Route Frequency Provider Last Rate Last Admin  . 0.9 %  sodium chloride infusion  500 mL Intravenous Continuous Gatha Mayer, MD        Allergies  Allergen Reactions  . Linzess [Linaclotide]     Too strong  . Lipitor [Atorvastatin]     elev LFTs    Past Medical History:  Diagnosis Date  . Anxiety   . Asthma   . Confusion 2016   admitted - ? TIA - NOT - was overmedication issue  . Depression   . Hyperlipidemia   . Insomnia   . RBBB     Past Surgical History:  Procedure Laterality Date  . COLONOSCOPY     . SHOULDER ARTHROSCOPY  10/2011   R Dr Meyer Cory    Social History   Tobacco Use  Smoking Status Never Smoker  Smokeless Tobacco Never Used    Social History   Substance and Sexual Activity  Alcohol Use No  . Alcohol/week: 0.0 standard drinks    Family History  Problem Relation Age of Onset  . Heart disease Mother 25       CABG  . Diabetes Mother   . Macular degeneration Mother   . Heart disease Father        Father died of MI at 81  . COPD Father   . Heart disease Brother   . Diabetes Brother   . Macular degeneration Brother     Reviw of Systems:  Noted in current history.  Otherwise systems are negative.  Physical Exam: Blood pressure 98/70, pulse 71, height 5\' 3"  (1.6 m), weight 135 lb (61.2 kg), SpO2 98 %.  GEN:  Well nourished, well developed in no acute distress HEENT: Normal NECK: No JVD; No carotid bruits LYMPHATICS: No lymphadenopathy CARDIAC: RRR , no murmurs, rubs, gallops RESPIRATORY:  Clear to auscultation without rales, wheezing or rhonchi  ABDOMEN: Soft, non-tender, non-distended MUSCULOSKELETAL:  No edema; No deformity  SKIN: Warm and dry NEUROLOGIC:  Alert and oriented x 3     ECG: April 06, 2019: Normal sinus rhythm at 71 beats a minute.  Right bundle branch block.  No ST or T wave changes.  Assessment / Plan:   1. Strong family history of coronary artery disease  2. Hyperlipidemia-   LDL is 80.  Her LDL was 60 last June.  I recommended that she work hard on diet, exercise, weight loss program.  Continue current medications.  She will have her labs rechecked by her primary medical doctor around 6 months.  3. ? TIA -she has moderate lateral carotid artery disease.  We will continue to treat her lipids aggressively.  Evelyn Moores, MD  04/06/2019 11:12 AM    Evelyn Murphy,  [place], [place]  [address] Pager [phone] Phone: [phone]; Fax: [phone]

## 2019-04-12 ENCOUNTER — Ambulatory Visit: Payer: Medicare Other | Attending: Internal Medicine

## 2019-04-12 DIAGNOSIS — Z23 Encounter for immunization: Secondary | ICD-10-CM | POA: Insufficient documentation

## 2019-04-12 NOTE — Progress Notes (Signed)
   Covid-19 Vaccination Clinic  Name:  Evelyn Murphy    MRN: [id number] DOB: September 05, 1945  04/12/2019  [name] was observed post Covid-19 immunization for 15 minutes without incidence. She was provided with Vaccine Information Sheet and instruction to access the V-Safe system.   [name] was instructed to call 911 with any severe reactions post vaccine: Marland Kitchen Difficulty breathing  . Swelling of your face and throat  . A fast heartbeat  . A bad rash all over your body  . Dizziness and weakness    Immunizations Administered    Name Date Dose VIS Date Route   Pfizer COVID-19 Vaccine 04/12/2019 12:48 PM 0.3 mL 03/02/2019 Intramuscular   Manufacturer: [place]   Lot: [id number]   [employer]: [id number]

## 2019-04-30 ENCOUNTER — Ambulatory Visit: Payer: Medicare Other | Attending: Internal Medicine

## 2019-04-30 DIAGNOSIS — Z23 Encounter for immunization: Secondary | ICD-10-CM | POA: Insufficient documentation

## 2019-04-30 NOTE — Progress Notes (Signed)
   Covid-19 Vaccination Clinic  Name:  Evelyn Murphy    MRN: [id number] DOB: Feb 09, 1946  04/30/2019  [name] was observed post Covid-19 immunization for 15 minutes without incidence. She was provided with Vaccine Information Sheet and instruction to access the V-Safe system.   [name] was instructed to call 911 with any severe reactions post vaccine: Marland Kitchen Difficulty breathing  . Swelling of your face and throat  . A fast heartbeat  . A bad rash all over your body  . Dizziness and weakness    Immunizations Administered    Name Date Dose VIS Date Route   Pfizer COVID-19 Vaccine 04/30/2019  9:08 AM 0.3 mL 03/02/2019 Intramuscular   Manufacturer: [place]   Lot: [id number]   [place]: [id number]

## 2019-05-10 ENCOUNTER — Ambulatory Visit: Payer: Medicare Other

## 2019-05-11 ENCOUNTER — Other Ambulatory Visit: Payer: Self-pay | Admitting: Cardiovascular Disease

## 2019-06-23 ENCOUNTER — Other Ambulatory Visit: Payer: Self-pay | Admitting: Internal Medicine

## 2019-06-24 ENCOUNTER — Other Ambulatory Visit: Payer: Self-pay | Admitting: Nurse Practitioner

## 2019-08-03 ENCOUNTER — Emergency Department (HOSPITAL_COMMUNITY): Payer: Medicare Other

## 2019-08-03 ENCOUNTER — Emergency Department (HOSPITAL_COMMUNITY)
Admission: EM | Admit: 2019-08-03 | Discharge: 2019-08-03 | Disposition: A | Discharge status: Home / Self Care | Payer: Medicare Other | Attending: Emergency Medicine | Admitting: Emergency Medicine

## 2019-08-03 ENCOUNTER — Encounter (HOSPITAL_COMMUNITY): Payer: Self-pay | Admitting: *Deleted

## 2019-08-03 DIAGNOSIS — M25512 Pain in left shoulder: Secondary | ICD-10-CM | POA: Insufficient documentation

## 2019-08-03 DIAGNOSIS — Z79899 Other long term (current) drug therapy: Secondary | ICD-10-CM | POA: Diagnosis not present

## 2019-08-03 DIAGNOSIS — S93402A Sprain of unspecified ligament of left ankle, initial encounter: Secondary | ICD-10-CM | POA: Diagnosis not present

## 2019-08-03 DIAGNOSIS — R202 Paresthesia of skin: Secondary | ICD-10-CM | POA: Insufficient documentation

## 2019-08-03 DIAGNOSIS — I5030 Unspecified diastolic (congestive) heart failure: Secondary | ICD-10-CM | POA: Diagnosis not present

## 2019-08-03 DIAGNOSIS — S99912A Unspecified injury of left ankle, initial encounter: Secondary | ICD-10-CM | POA: Diagnosis present

## 2019-08-03 DIAGNOSIS — Y999 Unspecified external cause status: Secondary | ICD-10-CM | POA: Diagnosis not present

## 2019-08-03 DIAGNOSIS — Y9389 Activity, other specified: Secondary | ICD-10-CM | POA: Diagnosis not present

## 2019-08-03 DIAGNOSIS — Y92410 Unspecified street and highway as the place of occurrence of the external cause: Secondary | ICD-10-CM | POA: Diagnosis not present

## 2019-08-03 DIAGNOSIS — R0789 Other chest pain: Secondary | ICD-10-CM | POA: Diagnosis not present

## 2019-08-03 DIAGNOSIS — Z7982 Long term (current) use of aspirin: Secondary | ICD-10-CM | POA: Diagnosis not present

## 2019-08-03 DIAGNOSIS — J45909 Unspecified asthma, uncomplicated: Secondary | ICD-10-CM | POA: Diagnosis not present

## 2019-08-03 DIAGNOSIS — M79642 Pain in left hand: Secondary | ICD-10-CM | POA: Insufficient documentation

## 2019-08-03 DIAGNOSIS — R000 Tachycardia, unspecified: Secondary | ICD-10-CM | POA: Diagnosis not present

## 2019-08-03 DIAGNOSIS — R079 Chest pain, unspecified: Secondary | ICD-10-CM | POA: Diagnosis not present
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
## 2019-08-03 NOTE — Discharge Instructions (Addendum)
Per our discussion, please make sure to follow-up with Dr. Durward Fortes regarding your left ankle.  I would recommend ibuprofen and Tylenol for management of your pain.  You can take 400 mg of ibuprofen and 325 mg of Tylenol at the same time.  I would recommend this 2-3 times per day for management of your pain.  Please apply ice to the joint as needed.  Please keep the joint elevated.  If you develop any new or worsening symptoms please do not hesitate to return to the emergency department.  It was a pleasure to meet you.

## 2019-08-03 NOTE — ED Notes (Signed)
Patient transported to XR. 
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
## 2019-08-07 ENCOUNTER — Encounter: Payer: Self-pay | Admitting: Family Medicine

## 2019-08-07 ENCOUNTER — Ambulatory Visit (INDEPENDENT_AMBULATORY_CARE_PROVIDER_SITE_OTHER): Payer: Medicare Other

## 2019-08-07 ENCOUNTER — Other Ambulatory Visit: Payer: Self-pay

## 2019-08-07 ENCOUNTER — Ambulatory Visit (INDEPENDENT_AMBULATORY_CARE_PROVIDER_SITE_OTHER): Payer: Medicare Other | Admitting: Family Medicine

## 2019-08-07 DIAGNOSIS — M25572 Pain in left ankle and joints of left foot: Secondary | ICD-10-CM

## 2019-08-07 DIAGNOSIS — S52572A Other intraarticular fracture of lower end of left radius, initial encounter for closed fracture: Secondary | ICD-10-CM

## 2019-08-07 DIAGNOSIS — S92125A Nondisplaced fracture of body of left talus, initial encounter for closed fracture: Secondary | ICD-10-CM | POA: Diagnosis not present

## 2019-08-07 DIAGNOSIS — S5290XA Unspecified fracture of unspecified forearm, initial encounter for closed fracture: Secondary | ICD-10-CM | POA: Insufficient documentation

## 2019-08-07 DIAGNOSIS — S62347A Nondisplaced fracture of base of fifth metacarpal bone. left hand, initial encounter for closed fracture: Secondary | ICD-10-CM

## 2019-08-07 DIAGNOSIS — S62309A Unspecified fracture of unspecified metacarpal bone, initial encounter for closed fracture: Secondary | ICD-10-CM | POA: Insufficient documentation

## 2019-08-07 DIAGNOSIS — M79642 Pain in left hand: Secondary | ICD-10-CM

## 2019-08-07 DIAGNOSIS — S92109A Unspecified fracture of unspecified talus, initial encounter for closed fracture: Secondary | ICD-10-CM | POA: Insufficient documentation

## 2019-08-07 MED ORDER — TRAMADOL HCL 50 MG PO TABS: 50.0000 mg | ORAL_TABLET | Freq: Four times a day (QID) | ORAL | 0 refills | Status: DC | PRN

## 2019-08-07 NOTE — Patient Instructions (Signed)
   Vitamin D3:  Take 2,000 - 5,000 IU daily  Vitamin K2:  100 mcg daily  Magnesium:  200-400 mg daily

## 2019-08-07 NOTE — Addendum Note (Signed)
Addended by: Hortencia Pilar on: 08/07/2019 02:24 PM   Modules accepted: Orders
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
## 2019-08-21 ENCOUNTER — Other Ambulatory Visit: Payer: Self-pay | Admitting: Surgery

## 2019-08-21 DIAGNOSIS — E042 Nontoxic multinodular goiter: Secondary | ICD-10-CM

## 2019-08-22 ENCOUNTER — Ambulatory Visit (INDEPENDENT_AMBULATORY_CARE_PROVIDER_SITE_OTHER): Payer: Medicare Other

## 2019-08-22 ENCOUNTER — Other Ambulatory Visit: Payer: Self-pay

## 2019-08-22 ENCOUNTER — Ambulatory Visit (INDEPENDENT_AMBULATORY_CARE_PROVIDER_SITE_OTHER): Payer: Medicare Other | Admitting: Family Medicine

## 2019-08-22 ENCOUNTER — Encounter: Payer: Self-pay | Admitting: Family Medicine

## 2019-08-22 DIAGNOSIS — S62347A Nondisplaced fracture of base of fifth metacarpal bone. left hand, initial encounter for closed fracture: Secondary | ICD-10-CM | POA: Diagnosis not present

## 2019-08-22 DIAGNOSIS — M25572 Pain in left ankle and joints of left foot: Secondary | ICD-10-CM | POA: Diagnosis not present

## 2019-08-22 DIAGNOSIS — S52572A Other intraarticular fracture of lower end of left radius, initial encounter for closed fracture: Secondary | ICD-10-CM
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
## 2019-09-04 ENCOUNTER — Encounter: Payer: Self-pay | Admitting: Internal Medicine

## 2019-09-04 ENCOUNTER — Ambulatory Visit (INDEPENDENT_AMBULATORY_CARE_PROVIDER_SITE_OTHER): Payer: Medicare Other | Admitting: Internal Medicine

## 2019-09-04 ENCOUNTER — Other Ambulatory Visit: Payer: Self-pay

## 2019-09-04 VITALS — BP 110/70 | HR 91 | Temp 98.7°F | Ht 63.0 in | Wt 135.0 lb

## 2019-09-04 DIAGNOSIS — D649 Anemia, unspecified: Secondary | ICD-10-CM | POA: Diagnosis not present

## 2019-09-04 DIAGNOSIS — R739 Hyperglycemia, unspecified: Secondary | ICD-10-CM | POA: Diagnosis not present

## 2019-09-04 DIAGNOSIS — F418 Other specified anxiety disorders: Secondary | ICD-10-CM | POA: Diagnosis not present

## 2019-09-04 DIAGNOSIS — M545 Low back pain: Secondary | ICD-10-CM | POA: Diagnosis not present

## 2019-09-04 DIAGNOSIS — I6523 Occlusion and stenosis of bilateral carotid arteries: Secondary | ICD-10-CM | POA: Diagnosis not present

## 2019-09-04 DIAGNOSIS — G8929 Other chronic pain: Secondary | ICD-10-CM

## 2019-09-04 DIAGNOSIS — J4530 Mild persistent asthma, uncomplicated: Secondary | ICD-10-CM

## 2019-09-04 LAB — BASIC METABOLIC PANEL
BUN: 21 mg/dL (ref 6–23)
CO2: 31 mEq/L (ref 19–32)
Calcium: 9.9 mg/dL (ref 8.4–10.5)
Chloride: 104 mEq/L (ref 96–112)
Creatinine, Ser: 0.95 mg/dL (ref 0.40–1.20)
GFR: 57.82 mL/min — ABNORMAL LOW (ref >60.00)
Glucose, Bld: 115 mg/dL — ABNORMAL HIGH (ref 70–99)
Potassium: 4.1 mEq/L (ref 3.5–5.1)
Sodium: 140 mEq/L (ref 135–145)

## 2019-09-04 LAB — HEMOGLOBIN A1C: Hgb A1c MFr Bld: 7.1 % — ABNORMAL HIGH (ref 4.6–6.5)

## 2019-09-04 MED ORDER — CITALOPRAM HYDROBROMIDE 40 MG PO TABS: 40.0000 mg | ORAL_TABLET | Freq: Every day | ORAL | 3 refills | Status: DC

## 2019-09-04 NOTE — Assessment & Plan Note (Signed)
Citalopram 
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
## 2019-09-04 NOTE — Assessment & Plan Note (Signed)
CBC
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
## 2019-09-04 NOTE — Assessment & Plan Note (Signed)
on Arnu[employer]

## 2019-09-05 ENCOUNTER — Ambulatory Visit
Admission: RE | Admit: 2019-09-05 | Discharge: 2019-09-05 | Disposition: A | Discharge status: Home / Self Care | Payer: Medicare Other | Source: Ambulatory Visit | Attending: Surgery | Admitting: Surgery

## 2019-09-05 DIAGNOSIS — E042 Nontoxic multinodular goiter: Secondary | ICD-10-CM

## 2019-09-06 ENCOUNTER — Other Ambulatory Visit: Payer: Self-pay

## 2019-09-06 ENCOUNTER — Ambulatory Visit: Payer: Self-pay

## 2019-09-06 ENCOUNTER — Ambulatory Visit (INDEPENDENT_AMBULATORY_CARE_PROVIDER_SITE_OTHER): Payer: Medicare Other | Admitting: Family Medicine

## 2019-09-06 ENCOUNTER — Encounter: Payer: Self-pay | Admitting: Family Medicine

## 2019-09-06 DIAGNOSIS — S52572D Other intraarticular fracture of lower end of left radius, subsequent encounter for closed fracture with routine healing: Secondary | ICD-10-CM | POA: Diagnosis not present

## 2019-09-06 DIAGNOSIS — S92125D Nondisplaced fracture of body of left talus, subsequent encounter for fracture with routine healing: Secondary | ICD-10-CM

## 2019-09-06 DIAGNOSIS — E213 Hyperparathyroidism, unspecified: Secondary | ICD-10-CM | POA: Diagnosis not present

## 2019-09-06 DIAGNOSIS — E042 Nontoxic multinodular goiter: Secondary | ICD-10-CM | POA: Diagnosis not present

## 2019-09-06 DIAGNOSIS — E210 Primary hyperparathyroidism: Secondary | ICD-10-CM | POA: Diagnosis not present
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
## 2019-10-01 ENCOUNTER — Ambulatory Visit (INDEPENDENT_AMBULATORY_CARE_PROVIDER_SITE_OTHER): Payer: Medicare Other | Admitting: Family Medicine

## 2019-10-01 ENCOUNTER — Ambulatory Visit (INDEPENDENT_AMBULATORY_CARE_PROVIDER_SITE_OTHER): Payer: Medicare Other

## 2019-10-01 ENCOUNTER — Other Ambulatory Visit: Payer: Self-pay

## 2019-10-01 ENCOUNTER — Encounter: Payer: Self-pay | Admitting: Family Medicine

## 2019-10-01 ENCOUNTER — Ambulatory Visit: Payer: Medicare Other | Admitting: Family Medicine

## 2019-10-01 DIAGNOSIS — S92125D Nondisplaced fracture of body of left talus, subsequent encounter for fracture with routine healing: Secondary | ICD-10-CM
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
## 2019-10-04 DIAGNOSIS — E042 Nontoxic multinodular goiter: Secondary | ICD-10-CM | POA: Diagnosis not present

## 2019-10-10 ENCOUNTER — Encounter: Payer: Self-pay | Admitting: Family Medicine

## 2019-10-10 ENCOUNTER — Ambulatory Visit (INDEPENDENT_AMBULATORY_CARE_PROVIDER_SITE_OTHER): Payer: Medicare Other

## 2019-10-10 ENCOUNTER — Ambulatory Visit (INDEPENDENT_AMBULATORY_CARE_PROVIDER_SITE_OTHER): Payer: Medicare Other | Admitting: Family Medicine

## 2019-10-10 ENCOUNTER — Other Ambulatory Visit: Payer: Self-pay

## 2019-10-10 DIAGNOSIS — S92125D Nondisplaced fracture of body of left talus, subsequent encounter for fracture with routine healing: Secondary | ICD-10-CM
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
## 2019-10-18 ENCOUNTER — Other Ambulatory Visit: Payer: Self-pay

## 2019-10-18 ENCOUNTER — Ambulatory Visit
Admission: RE | Admit: 2019-10-18 | Discharge: 2019-10-18 | Disposition: A | Discharge status: Home / Self Care | Payer: Medicare Other | Source: Ambulatory Visit | Attending: Family Medicine | Admitting: Family Medicine

## 2019-10-18 DIAGNOSIS — S92125D Nondisplaced fracture of body of left talus, subsequent encounter for fracture with routine healing: Secondary | ICD-10-CM

## 2019-10-18 DIAGNOSIS — R600 Localized edema: Secondary | ICD-10-CM | POA: Diagnosis not present

## 2019-10-18 DIAGNOSIS — S93492A Sprain of other ligament of left ankle, initial encounter: Secondary | ICD-10-CM | POA: Diagnosis not present

## 2019-10-18 DIAGNOSIS — M7989 Other specified soft tissue disorders: Secondary | ICD-10-CM | POA: Diagnosis not present

## 2019-10-19 ENCOUNTER — Telehealth: Payer: Self-pay | Admitting: Family Medicine

## 2019-10-19 NOTE — Telephone Encounter (Signed)
MRI shows that the fracture is healing, but not healed.  There's a fragment of the fracture that extends into the subtalar joint.  The anterior talofibular ligament is completely torn.

## 2019-10-22 NOTE — Telephone Encounter (Signed)
Since Dr Sharol Given is out this week do you think this could wait or should she see someone else?

## 2019-10-22 NOTE — Telephone Encounter (Signed)
Tried calling to schedule appointment. No answer. LMVM for her to call our office back to get appointment scheduled with Dr Sharol Given.

## 2019-10-22 NOTE — Telephone Encounter (Signed)
It's not urgent, and I think she's out of town this week as well.  Can wait until he's back.

## 2019-10-30 ENCOUNTER — Other Ambulatory Visit: Payer: Self-pay

## 2019-10-30 ENCOUNTER — Ambulatory Visit (INDEPENDENT_AMBULATORY_CARE_PROVIDER_SITE_OTHER): Payer: Medicare Other | Admitting: Family Medicine

## 2019-10-30 ENCOUNTER — Encounter: Payer: Self-pay | Admitting: Family Medicine

## 2019-10-30 DIAGNOSIS — S92125D Nondisplaced fracture of body of left talus, subsequent encounter for fracture with routine healing: Secondary | ICD-10-CM
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
## 2019-11-01 ENCOUNTER — Encounter (INDEPENDENT_AMBULATORY_CARE_PROVIDER_SITE_OTHER): Payer: Self-pay | Admitting: Otolaryngology

## 2019-11-01 ENCOUNTER — Encounter: Payer: Self-pay | Admitting: Orthopedic Surgery

## 2019-11-01 ENCOUNTER — Ambulatory Visit (INDEPENDENT_AMBULATORY_CARE_PROVIDER_SITE_OTHER): Payer: Medicare Other | Admitting: Otolaryngology

## 2019-11-01 ENCOUNTER — Ambulatory Visit (INDEPENDENT_AMBULATORY_CARE_PROVIDER_SITE_OTHER): Payer: Medicare Other | Admitting: Orthopedic Surgery

## 2019-11-01 ENCOUNTER — Other Ambulatory Visit: Payer: Self-pay

## 2019-11-01 VITALS — Temp 96.4°F

## 2019-11-01 DIAGNOSIS — H6123 Impacted cerumen, bilateral: Secondary | ICD-10-CM

## 2019-11-01 DIAGNOSIS — I6523 Occlusion and stenosis of bilateral carotid arteries: Secondary | ICD-10-CM | POA: Diagnosis not present

## 2019-11-01 DIAGNOSIS — S92135A Nondisplaced fracture of posterior process of left talus, initial encounter for closed fracture: Secondary | ICD-10-CM

## 2019-11-01 NOTE — Progress Notes (Signed)
HPI: Evelyn Murphy is a 74 y.o. female who returns today for evaluation of blockage of her ears on both sides with decreased hearing.  Her ears were last cleaned little over 3 years ago..  Past Medical History:  Diagnosis Date  . Anxiety   . Asthma   . Confusion 2016   admitted - ? TIA - NOT - was overmedication issue  . Depression   . Hyperlipidemia   . Insomnia   . RBBB    Past Surgical History:  Procedure Laterality Date  . COLONOSCOPY    . SHOULDER ARTHROSCOPY  10/2011   R Dr Meyer Cory   Social History   Socioeconomic History  . Marital status: Married    [name]  . Number of children: 2  . Years of education: Not on file  . Highest education level: Not on file  Occupational History  . Occupation: Retired  Tobacco Use  . Smoking status: Never Smoker  . Smokeless tobacco: Never Used  Vaping Use  . Vaping Use: Never used  Substance and Sexual Activity  . Alcohol use: No    Alcohol/week: 0.0 standard drinks  . Drug use: No  . Sexual activity: Yes  Other Topics Concern  . Not on file  Social History Narrative   Married retired 2 daughters   Social Determinants of Radio broadcast assistant Strain:   . Difficulty of Paying Living Expenses:   Food Insecurity:   . Worried About Charity fundraiser in the Last Year:   . Arboriculturist in the Last Year:   Transportation Needs:   . Film/video editor (Medical):   Marland Kitchen Lack of Transportation (Non-Medical):   Physical Activity:   . Days of Exercise per Week:   . Minutes of Exercise per Session:   Stress:   . Feeling of Stress :   Social Connections:   . Frequency of Communication with Friends and Family:   . Frequency of Social Gatherings with Friends and Family:   . Attends Religious Services:   . Active Member of Clubs or Organizations:   . Attends Archivist Meetings:   Marland Kitchen Marital Status:    Family History  Problem Relation Age of Onset  . Heart disease Mother 17       CABG   . Diabetes Mother   . Macular degeneration Mother   . Heart disease Father        Father died of MI at 71  . COPD Father   . Heart disease Brother   . Diabetes Brother   . Macular degeneration Brother    Allergies  Allergen Reactions  . Linzess [Linaclotide]     Too strong  . Lipitor [Atorvastatin]     elev LFTs   Prior to Admission medications   Medication Sig Start Date End Date Taking? Authorizing Provider  aspirin EC 81 MG tablet Take 1 tablet (81 mg total) by mouth daily. 07/01/14  Yes Evelyn Haff, MD  Cholecalciferol (VITAMIN D3) 2000 UNITS capsule Take 1 capsule (2,000 Units total) by mouth daily. 10/03/14  Yes Murphy, Evelyn Lacks, MD  citalopram (CELEXA) 40 MG tablet Take 1 tablet (40 mg total) by mouth daily. 09/04/19  Yes Murphy, Evelyn Lacks, MD  doxepin (SINEQUAN) 75 MG capsule TAKE 1 OR 2 CAPSULES AT BEDTIME 06/25/19  Yes Murphy, Evelyn Lacks, MD  ezetimibe (ZETIA) 10 MG tablet TAKE 1 TABLET BY MOUTH EVERY DAY 05/11/19  Yes Murphy, Evelyn Cheng,  MD  Fluticasone Furoate (ARNUITY ELLIPTA) 100 MCG/ACT AEPB Inhale 1 puff into the lungs daily. 05/30/18  Yes Evelyn Foy, NP  linaclotide (LINZESS) 290 MCG CAPS capsule Take 1 capsule (290 mcg total) by mouth daily. 03/06/19  Yes Murphy, Evelyn Lacks, MD  Multiple Vitamins-Minerals (MULTIVITAMIN WITH MINERALS) tablet Take 1 tablet by mouth daily.    Yes [provider]  pantoprazole (PROTONIX) 40 MG tablet Take 1 tablet (40 mg total) by mouth daily before breakfast. 07/18/17  Yes Murphy, Evelyn Lacks, MD  rosuvastatin (CRESTOR) 10 MG tablet Take 1 tablet (10 mg total) by mouth daily. 05/11/19  Yes Murphy, Evelyn Cheng, MD  UNABLE TO FIND Med Name: Valerian Root 2 per day   Yes [provider]  VENTOLIN HFA 108 (90 Base) MCG/ACT inhaler Inhale 2 puffs into the lungs every 6 (six) hours as needed.  02/18/16  Yes [provider]     Positive ROS: Otherwise negative  All other systems have been reviewed and  were otherwise negative with the exception of those mentioned in the HPI and as above.  Physical Exam: Constitutional: Alert, well-appearing, no acute distress Ears: External ears without lesions or tenderness. Ear canals are completely occluded with cerumen on both sides.  This was cleaned with forceps.  TMs are otherwise clear with improvement of her symptoms. Nasal: External nose without lesions.. Clear nasal passages Oral: Lips and gums without lesions. Tongue and palate mucosa without lesions. Posterior oropharynx clear. Neck: No palpable adenopathy or masses Respiratory: Breathing comfortably  Skin: No facial/neck lesions or rash noted.  Cerumen impaction removal  Date/Time: 11/01/2019 1:15 PM Performed by: Evelyn Nunnery, MD Authorized by: Evelyn Nunnery, MD   Consent:    Consent obtained:  Verbal   Consent given by:  Patient   Risks discussed:  Pain and bleeding Procedure details:    Location:  L ear and R ear   Procedure type: forceps   Post-procedure details:    Inspection:  TM intact and canal normal   Hearing quality:  Improved   Patient tolerance of procedure:  Tolerated well, no immediate complications Comments:     TMs are clear bilaterally.    Assessment: Bilateral cerumen impactions  Plan: This was cleaned in the office with resolution of her symptoms with improved hearing. She will follow-up in 1 to 2 years for recheck and cleaning as needed   Radene Journey, MD

## 2019-11-12 ENCOUNTER — Encounter: Payer: Self-pay | Admitting: Orthopedic Surgery

## 2019-11-12 DIAGNOSIS — S92135A Nondisplaced fracture of posterior process of left talus, initial encounter for closed fracture: Secondary | ICD-10-CM | POA: Diagnosis not present

## 2019-11-12 MED ORDER — METHYLPREDNISOLONE ACETATE 40 MG/ML IJ SUSP
40.0000 mg | INTRAMUSCULAR | Status: AC | PRN
  Administered 2019-11-12: 40 mg via INTRA_ARTICULAR

## 2019-11-12 MED ORDER — LIDOCAINE HCL 1 % IJ SOLN
1.0000 mL | INTRAMUSCULAR | Status: AC | PRN
Start: 2019-11-12 — End: 2019-11-12
  Administered 2019-11-12: 1 mL
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
## 2019-11-20 ENCOUNTER — Ambulatory Visit: Payer: Medicare Other | Attending: Internal Medicine

## 2019-11-20 DIAGNOSIS — Z23 Encounter for immunization: Secondary | ICD-10-CM

## 2019-11-20 NOTE — Progress Notes (Signed)
   Covid-19 Vaccination Clinic  Name:  Evelyn Murphy    MRN: [id number] DOB: Dec 10, 1945  11/20/2019  [name] was observed post Covid-19 immunization for 15 minutes without incident. She was provided with Vaccine Information Sheet and instruction to access the V-Safe system.   [name] was instructed to call 911 with any severe reactions post vaccine: Marland Kitchen Difficulty breathing  . Swelling of face and throat  . A fast heartbeat  . A bad rash all over body  . Dizziness and weakness

## 2019-11-22 ENCOUNTER — Ambulatory Visit (INDEPENDENT_AMBULATORY_CARE_PROVIDER_SITE_OTHER): Payer: Medicare Other | Admitting: Orthopedic Surgery

## 2019-11-22 DIAGNOSIS — M25572 Pain in left ankle and joints of left foot: Secondary | ICD-10-CM

## 2019-11-22 DIAGNOSIS — M25872 Other specified joint disorders, left ankle and foot: Secondary | ICD-10-CM

## 2019-11-22 DIAGNOSIS — I6523 Occlusion and stenosis of bilateral carotid arteries: Secondary | ICD-10-CM | POA: Diagnosis not present

## 2019-11-23 ENCOUNTER — Encounter: Payer: Self-pay | Admitting: Orthopedic Surgery
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
## 2019-11-27 DIAGNOSIS — M65872 Other synovitis and tenosynovitis, left ankle and foot: Secondary | ICD-10-CM | POA: Diagnosis not present

## 2019-12-11 ENCOUNTER — Telehealth: Payer: Self-pay

## 2019-12-11 ENCOUNTER — Other Ambulatory Visit: Payer: Self-pay

## 2019-12-11 NOTE — Telephone Encounter (Signed)
Patient called in to ask questions regarding a procedure.

## 2019-12-11 NOTE — Telephone Encounter (Signed)
I called and pt wanted to know if she should bring her scooter on the day of surgery is she would be weight bearing. Advised to bring with her she would be nwtb for at least the first week.

## 2019-12-13 ENCOUNTER — Other Ambulatory Visit: Payer: Self-pay

## 2019-12-13 ENCOUNTER — Other Ambulatory Visit: Payer: Self-pay | Admitting: Physician Assistant

## 2019-12-13 ENCOUNTER — Encounter (HOSPITAL_BASED_OUTPATIENT_CLINIC_OR_DEPARTMENT_OTHER): Payer: Self-pay | Admitting: Orthopedic Surgery

## 2019-12-13 NOTE — Progress Notes (Signed)

## 2019-12-14 ENCOUNTER — Other Ambulatory Visit (HOSPITAL_COMMUNITY)
Admission: RE | Admit: 2019-12-14 | Discharge: 2019-12-14 | Disposition: A | Discharge status: Home / Self Care | Payer: Medicare Other | Source: Ambulatory Visit | Attending: Orthopedic Surgery | Admitting: Orthopedic Surgery

## 2019-12-14 DIAGNOSIS — Z01812 Encounter for preprocedural laboratory examination: Secondary | ICD-10-CM | POA: Insufficient documentation

## 2019-12-14 DIAGNOSIS — Z20822 Contact with and (suspected) exposure to covid-19: Secondary | ICD-10-CM | POA: Diagnosis not present

## 2019-12-14 LAB — SARS CORONAVIRUS 2 (TAT 6-24 HRS): SARS Coronavirus 2: NEGATIVE

## 2019-12-18 ENCOUNTER — Ambulatory Visit (HOSPITAL_BASED_OUTPATIENT_CLINIC_OR_DEPARTMENT_OTHER): Payer: Medicare Other | Admitting: Anesthesiology

## 2019-12-18 ENCOUNTER — Ambulatory Visit (HOSPITAL_BASED_OUTPATIENT_CLINIC_OR_DEPARTMENT_OTHER)
Admission: RE | Admit: 2019-12-18 | Discharge: 2019-12-18 | Disposition: A | Discharge status: Home / Self Care | Payer: Medicare Other | Attending: Orthopedic Surgery | Admitting: Orthopedic Surgery

## 2019-12-18 ENCOUNTER — Encounter (HOSPITAL_BASED_OUTPATIENT_CLINIC_OR_DEPARTMENT_OTHER)
Admission: RE | Disposition: A | Discharge status: Home / Self Care | Payer: Self-pay | Source: Home / Self Care | Attending: Orthopedic Surgery

## 2019-12-18 ENCOUNTER — Encounter (HOSPITAL_BASED_OUTPATIENT_CLINIC_OR_DEPARTMENT_OTHER): Payer: Self-pay | Admitting: Orthopedic Surgery

## 2019-12-18 ENCOUNTER — Other Ambulatory Visit: Payer: Self-pay

## 2019-12-18 DIAGNOSIS — M65872 Other synovitis and tenosynovitis, left ankle and foot: Secondary | ICD-10-CM | POA: Diagnosis not present

## 2019-12-18 DIAGNOSIS — M24072 Loose body in left ankle: Secondary | ICD-10-CM | POA: Diagnosis not present

## 2019-12-18 DIAGNOSIS — M24075 Loose body in left toe joint(s): Secondary | ICD-10-CM | POA: Diagnosis not present

## 2019-12-18 DIAGNOSIS — G8918 Other acute postprocedural pain: Secondary | ICD-10-CM | POA: Diagnosis not present

## 2019-12-18 DIAGNOSIS — Z888 Allergy status to other drugs, medicaments and biological substances status: Secondary | ICD-10-CM | POA: Insufficient documentation

## 2019-12-18 DIAGNOSIS — M25872 Other specified joint disorders, left ankle and foot: Secondary | ICD-10-CM | POA: Insufficient documentation

## 2019-12-18 DIAGNOSIS — J45909 Unspecified asthma, uncomplicated: Secondary | ICD-10-CM | POA: Diagnosis not present

## 2019-12-18 HISTORY — PX: ANKLE ARTHROSCOPY: SHX545

## 2019-12-18 SURGERY — ARTHROSCOPY, ANKLE
Anesthesia: General | Site: Ankle | Laterality: Left

## 2019-12-18 MED ORDER — OXYCODONE HCL 5 MG PO TABS: 5.0000 mg | ORAL_TABLET | Freq: Once | ORAL | Status: DC | PRN

## 2019-12-18 MED ORDER — OXYCODONE-ACETAMINOPHEN 5-325 MG PO TABS: 1.0000 | ORAL_TABLET | ORAL | 0 refills | Status: DC | PRN

## 2019-12-18 MED ORDER — LIDOCAINE 2% (20 MG/ML) 5 ML SYRINGE
INTRAMUSCULAR | Status: AC
  Filled 2019-12-18: qty 5

## 2019-12-18 MED ORDER — DEXAMETHASONE SODIUM PHOSPHATE 10 MG/ML IJ SOLN
INTRAMUSCULAR | Status: AC
  Filled 2019-12-18: qty 3

## 2019-12-18 MED ORDER — AMISULPRIDE (ANTIEMETIC) 5 MG/2ML IV SOLN: 10.0000 mg | Freq: Once | INTRAVENOUS | Status: DC | PRN

## 2019-12-18 MED ORDER — ONDANSETRON HCL 4 MG/2ML IJ SOLN
INTRAMUSCULAR | Status: DC | PRN
  Administered 2019-12-18: 4 mg via INTRAVENOUS

## 2019-12-18 MED ORDER — FENTANYL CITRATE (PF) 100 MCG/2ML IJ SOLN
INTRAMUSCULAR | Status: AC
  Filled 2019-12-18: qty 2

## 2019-12-18 MED ORDER — MIDAZOLAM HCL 2 MG/2ML IJ SOLN
INTRAMUSCULAR | Status: AC
  Filled 2019-12-18: qty 2

## 2019-12-18 MED ORDER — CEFAZOLIN SODIUM-DEXTROSE 2-4 GM/100ML-% IV SOLN
INTRAVENOUS | Status: AC
  Filled 2019-12-18: qty 100

## 2019-12-18 MED ORDER — CEFAZOLIN SODIUM-DEXTROSE 2-4 GM/100ML-% IV SOLN
2.0000 g | INTRAVENOUS | Status: AC
  Administered 2019-12-18: 2 g via INTRAVENOUS

## 2019-12-18 MED ORDER — ROPIVACAINE HCL 5 MG/ML IJ SOLN
INTRAMUSCULAR | Status: DC | PRN
  Administered 2019-12-18: 30 mL via PERINEURAL

## 2019-12-18 MED ORDER — FENTANYL CITRATE (PF) 100 MCG/2ML IJ SOLN
100.0000 ug | Freq: Once | INTRAMUSCULAR | Status: AC
  Administered 2019-12-18: 50 ug via INTRAVENOUS

## 2019-12-18 MED ORDER — HYDROMORPHONE HCL 1 MG/ML IJ SOLN: 0.2500 mg | INTRAMUSCULAR | Status: DC | PRN

## 2019-12-18 MED ORDER — PHENYLEPHRINE 40 MCG/ML (10ML) SYRINGE FOR IV PUSH (FOR BLOOD PRESSURE SUPPORT)
PREFILLED_SYRINGE | INTRAVENOUS | Status: AC
  Filled 2019-12-18: qty 10

## 2019-12-18 MED ORDER — KETOROLAC TROMETHAMINE 15 MG/ML IJ SOLN
INTRAMUSCULAR | Status: DC | PRN
  Administered 2019-12-18: 15 mg via INTRAVENOUS

## 2019-12-18 MED ORDER — PROPOFOL 10 MG/ML IV BOLUS
INTRAVENOUS | Status: DC | PRN
  Administered 2019-12-18: 120 mg via INTRAVENOUS

## 2019-12-18 MED ORDER — OXYCODONE HCL 5 MG/5ML PO SOLN: 5.0000 mg | Freq: Once | ORAL | Status: DC | PRN

## 2019-12-18 MED ORDER — LACTATED RINGERS IV SOLN: INTRAVENOUS | Status: DC

## 2019-12-18 MED ORDER — LIDOCAINE 2% (20 MG/ML) 5 ML SYRINGE
INTRAMUSCULAR | Status: DC | PRN
  Administered 2019-12-18: 40 mg via INTRAVENOUS

## 2019-12-18 MED ORDER — DEXAMETHASONE SODIUM PHOSPHATE 10 MG/ML IJ SOLN
INTRAMUSCULAR | Status: DC | PRN
  Administered 2019-12-18: 5 mg via INTRAVENOUS

## 2019-12-18 MED ORDER — EPHEDRINE SULFATE-NACL 50-0.9 MG/10ML-% IV SOSY
PREFILLED_SYRINGE | INTRAVENOUS | Status: DC | PRN
  Administered 2019-12-18: 10 mg via INTRAVENOUS
  Administered 2019-12-18: 5 mg via INTRAVENOUS

## 2019-12-18 MED ORDER — PROMETHAZINE HCL 25 MG/ML IJ SOLN: 6.2500 mg | INTRAMUSCULAR | Status: DC | PRN

## 2019-12-18 MED ORDER — KETOROLAC TROMETHAMINE 30 MG/ML IJ SOLN
INTRAMUSCULAR | Status: AC
  Filled 2019-12-18: qty 2

## 2019-12-18 MED ORDER — PHENYLEPHRINE 40 MCG/ML (10ML) SYRINGE FOR IV PUSH (FOR BLOOD PRESSURE SUPPORT)
PREFILLED_SYRINGE | INTRAVENOUS | Status: DC | PRN
  Administered 2019-12-18: 80 ug via INTRAVENOUS
  Administered 2019-12-18: 40 ug via INTRAVENOUS

## 2019-12-18 MED ORDER — MIDAZOLAM HCL 2 MG/2ML IJ SOLN
2.0000 mg | Freq: Once | INTRAMUSCULAR | Status: AC
  Administered 2019-12-18: 2 mg via INTRAVENOUS

## 2019-12-18 SURGICAL SUPPLY — 37 items
BNDG CMPR 9X4 STRL LF SNTH (GAUZE/BANDAGES/DRESSINGS)
BNDG COHESIVE 4X5 TAN STRL (GAUZE/BANDAGES/DRESSINGS) ×2 IMPLANT
BNDG COHESIVE 6X5 TAN STRL LF (GAUZE/BANDAGES/DRESSINGS) ×3 IMPLANT
BNDG ESMARK 4X9 LF (GAUZE/BANDAGES/DRESSINGS) IMPLANT
BNDG GAUZE ELAST 4 BULKY (GAUZE/BANDAGES/DRESSINGS) IMPLANT
COVER WAND RF STERILE (DRAPES) IMPLANT
DISSECTOR 4.0MM X 13CM (MISCELLANEOUS) ×3 IMPLANT
DRAPE ARTHROSCOPY W/POUCH 90 (DRAPES) ×3 IMPLANT
DRAPE OEC MINIVIEW 54X84 (DRAPES) IMPLANT
DRAPE U-SHAPE 47X51 STRL (DRAPES) ×3 IMPLANT
DRSG EMULSION OIL 3X3 NADH (GAUZE/BANDAGES/DRESSINGS) ×3 IMPLANT
DURAPREP 26ML APPLICATOR (WOUND CARE) ×3 IMPLANT
EXCALIBUR 3.8MM X 13CM (MISCELLANEOUS) ×3 IMPLANT
GAUZE SPONGE 4X4 12PLY STRL (GAUZE/BANDAGES/DRESSINGS) ×3 IMPLANT
GLOVE BIO SURGEON STRL SZ 6.5 (GLOVE) ×1 IMPLANT
GLOVE BIO SURGEONS STRL SZ 6.5 (GLOVE) ×1
GLOVE BIOGEL PI IND STRL 7.0 (GLOVE) IMPLANT
GLOVE BIOGEL PI IND STRL 9 (GLOVE) ×1 IMPLANT
GLOVE BIOGEL PI INDICATOR 7.0 (GLOVE) ×2
GLOVE BIOGEL PI INDICATOR 9 (GLOVE) ×2
GLOVE SURG ORTHO 9.0 STRL STRW (GLOVE) ×3 IMPLANT
GOWN STRL REUS W/ TWL LRG LVL3 (GOWN DISPOSABLE) ×1 IMPLANT
GOWN STRL REUS W/ TWL XL LVL3 (GOWN DISPOSABLE) ×1 IMPLANT
GOWN STRL REUS W/TWL LRG LVL3 (GOWN DISPOSABLE) ×3
GOWN STRL REUS W/TWL XL LVL3 (GOWN DISPOSABLE) ×3
MANIFOLD NEPTUNE II (INSTRUMENTS) ×2 IMPLANT
PACK ARTHROSCOPY DSU (CUSTOM PROCEDURE TRAY) ×3 IMPLANT
PACK BASIN DAY SURGERY FS (CUSTOM PROCEDURE TRAY) ×3 IMPLANT
PORT APPOLLO RF 90DEGREE MULTI (SURGICAL WAND) IMPLANT
PROBE APOLLO 90XL (SURGICAL WAND) ×3 IMPLANT
SLEEVE SCD COMPRESS KNEE MED (MISCELLANEOUS) IMPLANT
STRAP ANKLE FOOT DISTRACTOR (ORTHOPEDIC SUPPLIES) ×1 IMPLANT
SUT ETHILON 2 0 FSLX (SUTURE) ×3 IMPLANT
SYR 5ML LL (SYRINGE) IMPLANT
TOWEL GREEN STERILE FF (TOWEL DISPOSABLE) ×3 IMPLANT
TUBING ARTHROSCOPY IRRIG 16FT (MISCELLANEOUS) ×3 IMPLANT
WATER STERILE IRR 1000ML POUR (IV SOLUTION) ×3 IMPLANT

## 2019-12-18 NOTE — Discharge Instructions (Signed)

## 2019-12-18 NOTE — Progress Notes (Signed)
Assisted Dr. Miller with left, ultrasound guided, popliteal block. Side rails up, monitors on throughout procedure. See vital signs in flow sheet. Tolerated Procedure well. 

## 2019-12-18 NOTE — Anesthesia Postprocedure Evaluation (Signed)
Anesthesia Post Note  Patient: JULENA BARBOUR  Procedure(s) Performed: LEFT ANKLE ARTHROSCOPY AND DEBRIDEMENT OF SUBTALAR JOINT DEBRIDEMENT ANKLE (Left Ankle)     Patient location during evaluation: PACU Anesthesia Type: General Level of consciousness: awake and alert Pain management: pain level controlled Vital Signs Assessment: post-procedure vital signs reviewed and stable Respiratory status: spontaneous breathing, nonlabored ventilation and respiratory function stable Cardiovascular status: blood pressure returned to baseline and stable Postop Assessment: no apparent nausea or vomiting Anesthetic complications: no   No complications documented.  Last Vitals:  Vitals:   12/18/19 1130 12/18/19 1150  BP:  132/77  Pulse: (!) 55 (!) 53  Resp: 12 18  Temp:  36.4 C  SpO2: 99% 96%    Last Pain:  Vitals:   12/18/19 1150  TempSrc: Oral  PainSc: 0-No pain                 Lynda Rainwater

## 2019-12-18 NOTE — Transfer of Care (Signed)
Immediate Anesthesia Transfer of Care Note  Patient: Evelyn Murphy  Procedure(s) Performed: LEFT ANKLE ARTHROSCOPY AND DEBRIDEMENT OF SUBTALAR JOINT DEBRIDEMENT ANKLE (Left Ankle)  Patient Location: PACU  Anesthesia Type:General and Regional  Level of Consciousness: drowsy  Airway & Oxygen Therapy: Patient Spontanous Breathing and Patient connected to face mask oxygen  Post-op Assessment: Report given to RN and Post -op Vital signs reviewed and stable  Post vital signs: Reviewed and stable  Last Vitals:  Vitals Value Taken Time  BP    Temp    Pulse    Resp    SpO2      Last Pain:  Vitals:   12/18/19 0927  TempSrc:   PainSc: 0-No pain      Patients Stated Pain Goal: 3 (88/11/03 1594)  Complications: No complications documented.

## 2019-12-18 NOTE — Op Note (Signed)
12/18/2019  10:48 AM  PATIENT:  Evelyn Murphy    PRE-OPERATIVE DIAGNOSIS:  Impingement with Loose Body Left Ankle  POST-OPERATIVE DIAGNOSIS:  Same  PROCEDURE:  LEFT ANKLE ARTHROSCOPY AND DEBRIDEMENT OF SUBTALAR JOINT DEBRIDEMENT ANKLE  SURGEON:  Newt Minion, MD  PHYSICIAN ASSISTANT:None ANESTHESIA:   General  PREOPERATIVE INDICATIONS:  Evelyn Murphy is a  74 y.o. female with a diagnosis of Impingement with Loose Body Left Ankle who failed conservative measures and elected for surgical management.    The risks benefits and alternatives were discussed with the patient preoperatively including but not limited to the risks of infection, bleeding, nerve injury, cardiopulmonary complications, the need for revision surgery, among others, and the patient was willing to proceed.  OPERATIVE IMPLANTS: none  @ENCIMAGES @  OPERATIVE FINDINGS: Patient had scar tissue and bone debridement from the posterior facet of the subtalar joint this was debrided the remaining articular cartilage was intact  Patient had extensive inflammation within the joint of the ankle with impingement and this was debrided the remaining joint was congruent with healthy viable cartilage.  OPERATIVE PROCEDURE: Patient was brought the operating room and underwent a general anesthetic after regional anesthetic.  After adequate levels anesthesia were obtained patient's left lower extremity was prepped using DuraPrep draped into a sterile field a timeout was called.  An oblique incision was made over the sinus Tarsi this was carried bluntly down to the subtalar joint retractors were placed to protect the peroneal tendons.  Patient had an inflamed tissue that was impinging this was removed there was also loose bodies laterally over the posterior facet and the loose bodies from the talus were removed.  The joint was irrigated with normal saline the remainder subtalar joint had good range of motion and good articular cartilage.  The  incision was closed using 2-0 nylon attention was then focused on the ankle.  The scope was inserted through the anterior medial portal and a working portal established anterior laterally.  The skin was incised sharply blunt dissection was carried down to the joint and a blunt trocar was used to enter the joint.  Patient had significant synovitis this was debrided with the shaver and the electrical wand.  Electrical wand was used hemostasis.  Visualization showed intact cartilage of the tibial talar joint there was some delamination of the cartilage off the anterior aspect of the tibia and this was debrided with a shaver.  Medial lateral gutters were clear the tibial talar joint was clear.  Instruments removed portals were closed using 2-0 nylon a sterile dressing was applied patient was extubated taken the PACU in stable condition.   DISCHARGE PLANNING:  Antibiotic duration: Preoperative antibiotics  Weightbearing: Weightbearing as tolerated  Pain medication: Prescription for Percocet  Dressing care/ Wound VAC: Discontinue dressing in 2 days  Ambulatory devices: Walker or crutches  Discharge to: Home.  Follow-up: In the office 1 week post operative.

## 2019-12-18 NOTE — Anesthesia Procedure Notes (Signed)
Anesthesia Regional Block: Popliteal block   Pre-Anesthetic Checklist: ,, timeout performed, Correct Patient, Correct Site, Correct Laterality, Correct Procedure, Correct Position, site marked, Risks and benefits discussed,  Surgical consent,  Pre-op evaluation,  At surgeon's request and post-op pain management  Laterality: Left  Prep: chloraprep       Needles:  Injection technique: Single-shot  Needle Type: Stimiplex     Needle Length: 9cm  Needle Gauge: 21     Additional Needles:   Procedures:,,,, ultrasound used (permanent image in chart),,,,  Narrative:  Start time: 12/18/2019 9:25 AM End time: 12/18/2019 9:30 AM Injection made incrementally with aspirations every 5 mL.  Performed by: Personally  Anesthesiologist: Lynda Rainwater, MD

## 2019-12-18 NOTE — Progress Notes (Signed)
Left popliteal block procedure completed at 0927 hrs.

## 2019-12-18 NOTE — Anesthesia Procedure Notes (Signed)
Procedure Name: LMA Insertion Date/Time: 12/18/2019 9:44 AM Performed by: Gwyndolyn Saxon, CRNA Pre-anesthesia Checklist: Patient identified, Emergency Drugs available, Suction available and Patient being monitored Patient Re-evaluated:Patient Re-evaluated prior to induction Oxygen Delivery Method: Circle system utilized Preoxygenation: Pre-oxygenation with 100% oxygen Induction Type: IV induction Ventilation: Mask ventilation without difficulty LMA: LMA inserted LMA Size: 4.0 Number of attempts: 1 Placement Confirmation: positive ETCO2 and breath sounds checked- equal and bilateral Dental Injury: Teeth and Oropharynx as per pre-operative assessment

## 2019-12-18 NOTE — Anesthesia Preprocedure Evaluation (Signed)
Anesthesia Evaluation  Patient identified by MRN, date of birth, ID band Patient awake    Reviewed: Allergy & Precautions, NPO status , Patient's Chart, lab work & pertinent test results  Airway Mallampati: II  TM Distance: >3 FB Neck ROM: Full    Dental no notable dental hx.    Pulmonary asthma ,    Pulmonary exam normal breath sounds clear to auscultation       Cardiovascular negative cardio ROS Normal cardiovascular exam Rhythm:Regular Rate:Normal     Neuro/Psych Anxiety Depression negative neurological ROS  negative psych ROS   GI/Hepatic Neg liver ROS, GERD  ,  Endo/Other  negative endocrine ROS  Renal/GU negative Renal ROS  negative genitourinary   Musculoskeletal negative musculoskeletal ROS (+)   Abdominal   Peds negative pediatric ROS (+)  Hematology negative hematology ROS (+)   Anesthesia Other Findings   Reproductive/Obstetrics negative OB ROS                             Anesthesia Physical Anesthesia Plan  ASA: II  Anesthesia Plan: General   Post-op Pain Management:  Regional for Post-op pain   Induction: Intravenous  PONV Risk Score and Plan: 3 and Ondansetron, Dexamethasone, Midazolam and Treatment may vary due to age or medical condition  Airway Management Planned: LMA  Additional Equipment:   Intra-op Plan:   Post-operative Plan: Extubation in OR  Informed Consent: I have reviewed the patients History and Physical, chart, labs and discussed the procedure including the risks, benefits and alternatives for the proposed anesthesia with the patient or authorized representative who has indicated his/her understanding and acceptance.     Dental advisory given  Plan Discussed with: CRNA  Anesthesia Plan Comments:         Anesthesia Quick Evaluation

## 2019-12-18 NOTE — H&P (Signed)
Evelyn Murphy is an 74 y.o. female.   Chief Complaint: Left Ankle Pain HPI: Patient is a 74 year old woman who was seen in follow-up for the left ankle and subtalar joint.  Patient states the injection helped the ligament pain but states she still has pain laterally she states she has pain primarily now over the lateral joint line of the ankle.  Past Medical History:  Diagnosis Date  . Anxiety   . Asthma   . Confusion 2016   admitted - ? TIA - NOT - was overmedication issue  . Depression   . Hyperlipidemia   . Insomnia   . RBBB     Past Surgical History:  Procedure Laterality Date  . COLONOSCOPY    . SHOULDER ARTHROSCOPY  10/2011   R Dr Meyer Cory    Family History  Problem Relation Age of Onset  . Heart disease Mother 46       CABG  . Diabetes Mother   . Macular degeneration Mother   . Heart disease Father        Father died of MI at 48  . COPD Father   . Heart disease Brother   . Diabetes Brother   . Macular degeneration Brother    Social History:  reports that she has never smoked. She has never used smokeless tobacco. She reports that she does not drink alcohol and does not use drugs.  Allergies:  Allergies  Allergen Reactions  . Linzess [Linaclotide]     Too strong  . Lipitor [Atorvastatin]     elev LFTs    No medications prior to admission.    No results found for this or any previous visit (from the past 48 hour(s)). No results found.  Review of Systems  All other systems reviewed and are negative.   Height 5\' 3"  (1.6 m), weight 63.5 kg. Physical Exam  Patient is alert, oriented, no adenopathy, well-dressed, normal affect, normal respiratory effort. Examination patient has good pulses she has minimal tenderness to palpation of the sinus Tarsi at this time she is most painful to palpation over the lateral ankle joint line.  Anterior drawer is stable.  Previous MRI scan showed the anterior talofibular ligament injury as well as the lateral talar process  fracture.Heart RR Lungs Clear Assessment/Plan 1. Pain in left ankle and joints of left foot   2. Impingement syndrome of left ankle     Plan: Due to failure of conservative care patient states she would like to proceed with surgical intervention, plan for left ankle arthroscopy with debridement of the impingement as well as subtalar joint open versus arthroscopic procedure to evaluate joint congruence and removal of the lateral talar process fracture fragment if necessary.    Bevely Palmer Syna Gad, [place] 12/18/2019, 6:42 AM

## 2019-12-20 ENCOUNTER — Encounter (HOSPITAL_BASED_OUTPATIENT_CLINIC_OR_DEPARTMENT_OTHER): Payer: Self-pay | Admitting: Orthopedic Surgery

## 2019-12-22 ENCOUNTER — Other Ambulatory Visit: Payer: Self-pay | Admitting: Internal Medicine

## 2019-12-24 DIAGNOSIS — Z23 Encounter for immunization: Secondary | ICD-10-CM | POA: Diagnosis not present

## 2019-12-25 ENCOUNTER — Encounter: Payer: Self-pay | Admitting: Physician Assistant

## 2019-12-25 ENCOUNTER — Ambulatory Visit (INDEPENDENT_AMBULATORY_CARE_PROVIDER_SITE_OTHER): Payer: Medicare Other | Admitting: Physician Assistant

## 2019-12-25 DIAGNOSIS — M25872 Other specified joint disorders, left ankle and foot: Secondary | ICD-10-CM
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
## 2020-01-01 ENCOUNTER — Ambulatory Visit (INDEPENDENT_AMBULATORY_CARE_PROVIDER_SITE_OTHER): Payer: Medicare Other | Admitting: Physician Assistant

## 2020-01-01 ENCOUNTER — Encounter: Payer: Self-pay | Admitting: Physician Assistant

## 2020-01-01 DIAGNOSIS — M25872 Other specified joint disorders, left ankle and foot: Secondary | ICD-10-CM
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
## 2020-01-21 ENCOUNTER — Other Ambulatory Visit: Payer: Self-pay

## 2020-01-21 ENCOUNTER — Ambulatory Visit (INDEPENDENT_AMBULATORY_CARE_PROVIDER_SITE_OTHER): Payer: Medicare Other | Admitting: Family Medicine

## 2020-01-21 ENCOUNTER — Encounter: Payer: Self-pay | Admitting: Family Medicine

## 2020-01-21 DIAGNOSIS — M25511 Pain in right shoulder: Secondary | ICD-10-CM | POA: Diagnosis not present

## 2020-01-21 NOTE — Progress Notes (Signed)
I saw and examined the patient with Dr. Elouise Munroe and agree with assessment and plan as outlined.    Right shoulder pain for 2 weeks, no injury to account for it.  Posterolateral pain with reaching forward.  History of arthroscopic debridement in past.  Will inject the subacromial space today.  Return as needed.
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
## 2020-01-22 ENCOUNTER — Encounter: Payer: Self-pay | Admitting: Physician Assistant

## 2020-01-22 ENCOUNTER — Ambulatory Visit (INDEPENDENT_AMBULATORY_CARE_PROVIDER_SITE_OTHER): Payer: Medicare Other | Admitting: Physician Assistant

## 2020-01-22 VITALS — Ht 63.0 in | Wt 155.0 lb

## 2020-01-22 DIAGNOSIS — M24072 Loose body in left ankle: Secondary | ICD-10-CM

## 2020-01-22 DIAGNOSIS — M24075 Loose body in left toe joint(s): Secondary | ICD-10-CM
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
## 2020-02-19 ENCOUNTER — Encounter: Payer: Self-pay | Admitting: Physician Assistant

## 2020-02-19 ENCOUNTER — Ambulatory Visit (INDEPENDENT_AMBULATORY_CARE_PROVIDER_SITE_OTHER): Payer: Medicare Other | Admitting: Physician Assistant

## 2020-02-19 VITALS — Ht 63.0 in | Wt 155.0 lb

## 2020-02-19 DIAGNOSIS — S52572D Other intraarticular fracture of lower end of left radius, subsequent encounter for closed fracture with routine healing: Secondary | ICD-10-CM
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
## 2020-03-04 ENCOUNTER — Other Ambulatory Visit: Payer: Self-pay

## 2020-03-04 ENCOUNTER — Encounter: Payer: Self-pay | Admitting: Family Medicine

## 2020-03-04 ENCOUNTER — Ambulatory Visit (INDEPENDENT_AMBULATORY_CARE_PROVIDER_SITE_OTHER): Payer: Medicare Other | Admitting: Family Medicine

## 2020-03-04 DIAGNOSIS — M25572 Pain in left ankle and joints of left foot: Secondary | ICD-10-CM
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
## 2020-03-05 ENCOUNTER — Ambulatory Visit (INDEPENDENT_AMBULATORY_CARE_PROVIDER_SITE_OTHER): Payer: Medicare Other | Admitting: Internal Medicine

## 2020-03-05 ENCOUNTER — Encounter: Payer: Self-pay | Admitting: Internal Medicine

## 2020-03-05 VITALS — BP 112/78 | HR 76 | Temp 98.1°F

## 2020-03-05 DIAGNOSIS — F418 Other specified anxiety disorders: Secondary | ICD-10-CM | POA: Diagnosis not present

## 2020-03-05 DIAGNOSIS — E785 Hyperlipidemia, unspecified: Secondary | ICD-10-CM

## 2020-03-05 DIAGNOSIS — Z0000 Encounter for general adult medical examination without abnormal findings: Secondary | ICD-10-CM | POA: Diagnosis not present

## 2020-03-05 DIAGNOSIS — I6523 Occlusion and stenosis of bilateral carotid arteries: Secondary | ICD-10-CM | POA: Diagnosis not present

## 2020-03-05 DIAGNOSIS — R7989 Other specified abnormal findings of blood chemistry: Secondary | ICD-10-CM | POA: Diagnosis not present

## 2020-03-05 DIAGNOSIS — R739 Hyperglycemia, unspecified: Secondary | ICD-10-CM | POA: Diagnosis not present

## 2020-03-05 LAB — URINALYSIS
Bilirubin Urine: NEGATIVE
Hgb urine dipstick: NEGATIVE
Ketones, ur: NEGATIVE
Leukocytes,Ua: NEGATIVE
Nitrite: NEGATIVE
Specific Gravity, Urine: 1.02 (ref 1.000–1.030)
Total Protein, Urine: NEGATIVE
Urine Glucose: NEGATIVE
Urobilinogen, UA: 0.2 (ref 0.0–1.0)
pH: 8.5 — AB (ref 5.0–8.0)

## 2020-03-05 LAB — COMPREHENSIVE METABOLIC PANEL
ALT: 27 U/L (ref 0–35)
AST: 23 U/L (ref 0–37)
Albumin: 4.3 g/dL (ref 3.5–5.2)
Alkaline Phosphatase: 88 U/L (ref 39–117)
BUN: 20 mg/dL (ref 6–23)
CO2: 32 mEq/L (ref 19–32)
Calcium: 9.9 mg/dL (ref 8.4–10.5)
Chloride: 102 mEq/L (ref 96–112)
Creatinine, Ser: 0.78 mg/dL (ref 0.40–1.20)
GFR: 75.82 mL/min (ref >60.00)
Glucose, Bld: 103 mg/dL — ABNORMAL HIGH (ref 70–99)
Potassium: 4.4 mEq/L (ref 3.5–5.1)
Sodium: 139 mEq/L (ref 135–145)
Total Bilirubin: 0.4 mg/dL (ref 0.2–1.2)
Total Protein: 7.2 g/dL (ref 6.0–8.3)

## 2020-03-05 LAB — CBC WITH DIFFERENTIAL/PLATELET
Basophils Absolute: 0 10*3/uL (ref 0.0–0.1)
Basophils Relative: 0.7 % (ref 0.0–3.0)
Eosinophils Absolute: 0.3 10*3/uL (ref 0.0–0.7)
Eosinophils Relative: 6.5 % — ABNORMAL HIGH (ref 0.0–5.0)
HCT: 37.3 % (ref 36.0–46.0)
Hemoglobin: 12.4 g/dL (ref 12.0–15.0)
Lymphocytes Relative: 28.4 % (ref 12.0–46.0)
Lymphs Abs: 1.5 10*3/uL (ref 0.7–4.0)
MCHC: 33.3 g/dL (ref 30.0–36.0)
MCV: 86.1 fl (ref 78.0–100.0)
Monocytes Absolute: 0.5 10*3/uL (ref 0.1–1.0)
Monocytes Relative: 8.8 % (ref 3.0–12.0)
Neutro Abs: 2.9 10*3/uL (ref 1.4–7.7)
Neutrophils Relative %: 55.6 % (ref 43.0–77.0)
Platelets: 261 10*3/uL (ref 150.0–400.0)
RBC: 4.32 Mil/uL (ref 3.87–5.11)
RDW: 13.1 % (ref 11.5–15.5)
WBC: 5.2 10*3/uL (ref 4.0–10.5)

## 2020-03-05 LAB — LIPID PANEL
Cholesterol: 162 mg/dL (ref 0–200)
HDL: 77 mg/dL (ref >39.00)
LDL Cholesterol: 68 mg/dL (ref 0–99)
NonHDL: 84.99
Total CHOL/HDL Ratio: 2
Triglycerides: 84 mg/dL (ref 0.0–149.0)
VLDL: 16.8 mg/dL (ref 0.0–40.0)

## 2020-03-05 LAB — HEMOGLOBIN A1C: Hgb A1c MFr Bld: 6.6 % — ABNORMAL HIGH (ref 4.6–6.5)

## 2020-03-05 LAB — TSH: TSH: 3.01 u[IU]/mL (ref 0.35–4.50)

## 2020-03-05 LAB — MICROALBUMIN / CREATININE URINE RATIO
Creatinine,U: 75 mg/dL
Microalb Creat Ratio: 0.9 mg/g (ref 0.0–30.0)
Microalb, Ur: <0.7 mg/dL (ref 0.0–1.9)

## 2020-03-05 NOTE — Progress Notes (Signed)
Subjective:  Patient ID: Evelyn Murphy, female    DOB: March 01, 1946  Age: 74 y.o. MRN: [id number]  CC: Diabetes (6 Month F/U)   HPI Evelyn Murphy presents for L ankle pain - can walk <1 mi: swimming (water aerobics) F/u insomnia, dyslipidemia    Outpatient Medications Prior to Visit  Medication Sig Dispense Refill   aspirin EC 81 MG tablet Take 1 tablet (81 mg total) by mouth daily. 30 tablet 0   Cholecalciferol (VITAMIN D3) 2000 UNITS capsule Take 1 capsule (2,000 Units total) by mouth daily. 100 capsule 3   citalopram (CELEXA) 40 MG tablet Take 1 tablet (40 mg total) by mouth daily. 90 tablet 3   doxepin (SINEQUAN) 75 MG capsule TAKE 1 OR 2 CAPSULES AT BEDTIME 180 capsule 1   ezetimibe (ZETIA) 10 MG tablet TAKE 1 TABLET BY MOUTH EVERY DAY 90 tablet 3   Fluticasone Furoate (ARNUITY ELLIPTA) 100 MCG/ACT AEPB Inhale 1 puff into the lungs daily. 30 each 1   linaclotide (LINZESS) 290 MCG CAPS capsule Take 1 capsule (290 mcg total) by mouth daily. 90 capsule 3   magnesium 30 MG tablet Take 30 mg by mouth 2 (two) times daily.     Multiple Vitamins-Minerals (MULTIVITAMIN WITH MINERALS) tablet Take 1 tablet by mouth daily.      pantoprazole (PROTONIX) 40 MG tablet Take 1 tablet (40 mg total) by mouth daily before breakfast. 90 tablet 3   Potassium (POTASSIMIN PO) Take by mouth.     rosuvastatin (CRESTOR) 10 MG tablet Take 1 tablet (10 mg total) by mouth daily. 90 tablet 3   UNABLE TO FIND Med Name: Valerian Root 2 per day     VENTOLIN HFA 108 (90 Base) MCG/ACT inhaler Inhale 2 puffs into the lungs every 6 (six) hours as needed.      0.9 %  sodium chloride infusion      No facility-administered medications prior to visit.    ROS: Review of Systems  Constitutional: Negative for activity change, appetite change, chills, fatigue and unexpected weight change.  HENT: Negative for congestion, mouth sores and sinus pressure.   Eyes: Negative for visual disturbance.   Respiratory: Negative for cough and chest tightness.   Gastrointestinal: Negative for abdominal pain and nausea.  Genitourinary: Negative for difficulty urinating, frequency and vaginal pain.  Musculoskeletal: Positive for gait problem. Negative for back pain.  Skin: Negative for pallor and rash.  Neurological: Negative for dizziness, tremors, weakness, numbness and headaches.  Psychiatric/Behavioral: Negative for confusion, sleep disturbance and suicidal ideas.    Objective:  BP 112/78 (BP Location: Left Arm)    Pulse 76    Temp 98.1 F (36.7 C) (Oral)    SpO2 97%   BP Readings from Last 3 Encounters:  03/05/20 112/78  12/18/19 132/77  09/04/19 110/70    Wt Readings from Last 3 Encounters:  02/19/20 155 lb (70.3 kg)  01/22/20 155 lb (70.3 kg)  12/18/19 155 lb 6.8 oz (70.5 kg)    Physical Exam Constitutional:      General: She is not in acute distress.    Appearance: She is well-developed.  HENT:     Head: Normocephalic.     Right Ear: External ear normal.     Left Ear: External ear normal.     Nose: Nose normal.     Mouth/Throat:     Mouth: Oropharynx is clear and moist.  Eyes:     General:        Right  eye: No discharge.        Left eye: No discharge.     Conjunctiva/sclera: Conjunctivae normal.     Pupils: Pupils are equal, round, and reactive to light.  Neck:     Thyroid: No thyromegaly.     Vascular: No JVD.     Trachea: No tracheal deviation.  Cardiovascular:     Rate and Rhythm: Normal rate and regular rhythm.     Heart sounds: Normal heart sounds.  Pulmonary:     Effort: No respiratory distress.     Breath sounds: No stridor. No wheezing.  Abdominal:     General: Bowel sounds are normal. There is no distension.     Palpations: Abdomen is soft. There is no mass.     Tenderness: There is no abdominal tenderness. There is no guarding or rebound.  Musculoskeletal:        General: Tenderness present. No edema.     Cervical back: Normal range of motion  and neck supple.  Lymphadenopathy:     Cervical: No cervical adenopathy.  Skin:    Findings: No erythema or rash.  Neurological:     Mental Status: She is oriented to person, place, and time.     Cranial Nerves: No cranial nerve deficit.     Motor: No abnormal muscle tone.     Coordination: Coordination normal.     Deep Tendon Reflexes: Reflexes normal.  Psychiatric:        Mood and Affect: Mood and affect normal.        Behavior: Behavior normal.        Thought Content: Thought content normal.        Judgment: Judgment normal.     Lab Results  Component Value Date   WBC 5.7 03/06/2019   HGB 12.9 03/06/2019   HCT 39.4 03/06/2019   PLT 263.0 03/06/2019   GLUCOSE 115 (H) 09/04/2019   CHOL 168 03/06/2019   TRIG 135.0 03/06/2019   HDL 60.90 03/06/2019   LDLDIRECT 135.5 01/24/2013   LDLCALC 80 03/06/2019   ALT 35 03/06/2019   AST 33 03/06/2019   NA 140 09/04/2019   K 4.1 09/04/2019   CL 104 09/04/2019   CREATININE 0.95 09/04/2019   BUN 21 09/04/2019   CO2 31 09/04/2019   TSH 2.17 03/06/2019   INR 0.96 06/29/2014   HGBA1C 7.1 (H) 09/04/2019    No results found.  Assessment & Plan:    Walker Kehr, MD

## 2020-03-05 NOTE — Assessment & Plan Note (Signed)
CBC

## 2020-03-05 NOTE — Assessment & Plan Note (Signed)
Citalopram 

## 2020-03-05 NOTE — Assessment & Plan Note (Signed)
Labs

## 2020-03-19 ENCOUNTER — Other Ambulatory Visit: Payer: Self-pay

## 2020-03-19 ENCOUNTER — Ambulatory Visit (INDEPENDENT_AMBULATORY_CARE_PROVIDER_SITE_OTHER): Payer: Medicare Other | Admitting: Physical Therapy

## 2020-03-19 DIAGNOSIS — M25572 Pain in left ankle and joints of left foot: Secondary | ICD-10-CM | POA: Diagnosis not present

## 2020-03-19 NOTE — Patient Instructions (Signed)
Access Code: [email or web address] URL: [email or web address] Date: 03/19/2020 Prepared by: Sedalia Muta  Exercises Long Sitting Ankle Dorsiflexion AROM - 1 x daily - 2 sets - 10 reps Supine Ankle Inversion Eversion AROM - 1 x daily - 2 sets - 10 reps Seated Heel Raise - 1 x daily - 2 sets - 10 reps Stride Stance Weight Shift - 1 x daily - 2 sets - 10 reps

## 2020-03-24 ENCOUNTER — Encounter: Payer: Medicare Other | Admitting: Physical Therapy

## 2020-03-25 ENCOUNTER — Other Ambulatory Visit: Payer: Self-pay

## 2020-03-25 ENCOUNTER — Ambulatory Visit (INDEPENDENT_AMBULATORY_CARE_PROVIDER_SITE_OTHER): Payer: Medicare Other | Admitting: Physical Therapy

## 2020-03-25 ENCOUNTER — Encounter: Payer: Self-pay | Admitting: Physical Therapy

## 2020-03-25 DIAGNOSIS — M25572 Pain in left ankle and joints of left foot: Secondary | ICD-10-CM
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
## 2020-03-26 ENCOUNTER — Encounter: Payer: Medicare Other | Admitting: Physical Therapy

## 2020-03-26 ENCOUNTER — Encounter: Payer: Self-pay | Admitting: Physical Therapy

## 2020-03-26 ENCOUNTER — Ambulatory Visit (INDEPENDENT_AMBULATORY_CARE_PROVIDER_SITE_OTHER): Payer: Medicare Other | Admitting: Physical Therapy

## 2020-03-26 DIAGNOSIS — M25572 Pain in left ankle and joints of left foot: Secondary | ICD-10-CM
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
## 2020-03-31 ENCOUNTER — Ambulatory Visit (INDEPENDENT_AMBULATORY_CARE_PROVIDER_SITE_OTHER): Payer: Medicare Other | Admitting: Physical Therapy

## 2020-03-31 ENCOUNTER — Encounter: Payer: Self-pay | Admitting: Physical Therapy

## 2020-03-31 ENCOUNTER — Other Ambulatory Visit: Payer: Self-pay

## 2020-03-31 DIAGNOSIS — M25572 Pain in left ankle and joints of left foot: Secondary | ICD-10-CM | POA: Diagnosis not present
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
## 2020-04-01 ENCOUNTER — Other Ambulatory Visit: Payer: Self-pay | Admitting: Internal Medicine

## 2020-04-02 ENCOUNTER — Ambulatory Visit (INDEPENDENT_AMBULATORY_CARE_PROVIDER_SITE_OTHER): Payer: Medicare Other | Admitting: Physical Therapy

## 2020-04-02 ENCOUNTER — Other Ambulatory Visit: Payer: Self-pay

## 2020-04-02 ENCOUNTER — Encounter: Payer: Self-pay | Admitting: Physical Therapy

## 2020-04-02 DIAGNOSIS — M25572 Pain in left ankle and joints of left foot: Secondary | ICD-10-CM | POA: Diagnosis not present
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
## 2020-04-09 ENCOUNTER — Encounter: Payer: Medicare Other | Admitting: Physical Therapy

## 2020-04-09 ENCOUNTER — Encounter: Payer: Self-pay | Admitting: Cardiovascular Disease
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
## 2020-04-10 ENCOUNTER — Encounter: Payer: Self-pay | Admitting: Cardiovascular Disease

## 2020-04-10 ENCOUNTER — Other Ambulatory Visit: Payer: Self-pay

## 2020-04-10 ENCOUNTER — Ambulatory Visit (INDEPENDENT_AMBULATORY_CARE_PROVIDER_SITE_OTHER): Payer: Medicare Other | Admitting: Cardiovascular Disease

## 2020-04-10 VITALS — BP 92/74 | HR 79 | Ht 63.0 in | Wt 140.0 lb

## 2020-04-10 DIAGNOSIS — E785 Hyperlipidemia, unspecified: Secondary | ICD-10-CM

## 2020-04-10 DIAGNOSIS — I4510 Unspecified right bundle-branch block: Secondary | ICD-10-CM

## 2020-04-10 NOTE — Patient Instructions (Signed)
Medication Instructions:  Your physician recommends that you continue on your current medications as directed. Please refer to the Current Medication list given to you today.  *If you need a refill on your cardiac medications before your next appointment, please call your pharmacy*   Lab Work: None If you have labs (blood work) drawn today and your tests are completely normal, you will receive your results only by: MyChart Message (if you have MyChart) OR A paper copy in the mail If you have any lab test that is abnormal or we need to change your treatment, we will call you to review the results.   Testing/Procedures: None   Follow-Up: At [facility], you and your health needs are our priority.  As part of our continuing mission to provide you with exceptional heart care, we have created designated Provider Care Teams.  These Care Teams include your primary Cardiologist (physician) and Advanced Practice Providers (APPs -  Physician Assistants and Nurse Practitioners) who all work together to provide you with the care you need, when you need it.  We recommend signing up for the patient portal called "MyChart".  Sign up information is provided on this After Visit Summary.  MyChart is used to connect with patients for Virtual Visits (Telemedicine).  Patients are able to view lab/test results, encounter notes, upcoming appointments, etc.  Non-urgent messages can be sent to your provider as well.   To learn more about what you can do with MyChart, go to [email or web address].    Your next appointment:   1 year(s)  The format for your next appointment:   In Person  Provider:   You may see Mahesh Chandrasekhar, MD or one of the following Advanced Practice Providers on your designated Care Team:   Dayna Dunn, PA-C Michele Lenze, PA-C   Other Instructions   

## 2020-04-16 ENCOUNTER — Encounter: Payer: Medicare Other | Admitting: Physical Therapy

## 2020-04-21 ENCOUNTER — Ambulatory Visit (INDEPENDENT_AMBULATORY_CARE_PROVIDER_SITE_OTHER): Payer: Medicare Other | Admitting: Physical Therapy

## 2020-04-21 ENCOUNTER — Other Ambulatory Visit: Payer: Self-pay

## 2020-04-21 ENCOUNTER — Encounter: Payer: Self-pay | Admitting: Physical Therapy

## 2020-04-21 DIAGNOSIS — M25572 Pain in left ankle and joints of left foot: Secondary | ICD-10-CM
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
## 2020-04-22 ENCOUNTER — Telehealth: Payer: Self-pay | Admitting: Internal Medicine

## 2020-04-22 NOTE — Telephone Encounter (Signed)
Called to schedule AWV pt declined and hung up on me.

## 2020-04-28 ENCOUNTER — Encounter: Payer: Medicare Other | Admitting: Physical Therapy

## 2020-05-01 ENCOUNTER — Encounter: Payer: Medicare Other | Admitting: Physical Therapy

## 2020-05-06 ENCOUNTER — Encounter: Payer: Medicare Other | Admitting: Physical Therapy

## 2020-05-08 ENCOUNTER — Encounter: Payer: Medicare Other | Admitting: Physical Therapy

## 2020-05-26 ENCOUNTER — Other Ambulatory Visit: Payer: Self-pay | Admitting: Cardiovascular Disease

## 2020-05-31 ENCOUNTER — Other Ambulatory Visit: Payer: Self-pay | Admitting: Cardiovascular Disease

## 2020-06-05 ENCOUNTER — Ambulatory Visit (INDEPENDENT_AMBULATORY_CARE_PROVIDER_SITE_OTHER): Payer: Medicare Other | Admitting: Family Medicine

## 2020-06-05 ENCOUNTER — Other Ambulatory Visit: Payer: Self-pay

## 2020-06-05 DIAGNOSIS — M25572 Pain in left ankle and joints of left foot: Secondary | ICD-10-CM | POA: Diagnosis not present
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
## 2020-06-16 ENCOUNTER — Other Ambulatory Visit: Payer: Self-pay | Admitting: Internal Medicine

## 2020-07-02 ENCOUNTER — Other Ambulatory Visit: Payer: Self-pay

## 2020-07-02 ENCOUNTER — Encounter: Payer: Self-pay | Admitting: Family

## 2020-07-02 ENCOUNTER — Ambulatory Visit (INDEPENDENT_AMBULATORY_CARE_PROVIDER_SITE_OTHER): Payer: Medicare Other | Admitting: Family

## 2020-07-02 VITALS — BP 110/60 | HR 73 | Temp 98.3°F

## 2020-07-02 DIAGNOSIS — R350 Frequency of micturition: Secondary | ICD-10-CM

## 2020-07-02 LAB — POCT URINALYSIS DIP (CLINITEK)
Bilirubin, UA: NEGATIVE
Glucose, UA: NEGATIVE mg/dL
Ketones, POC UA: NEGATIVE mg/dL
Nitrite, UA: NEGATIVE
POC PROTEIN,UA: NEGATIVE
Spec Grav, UA: 1.01 (ref 1.010–1.025)
Urobilinogen, UA: 0.2 E.U./dL
pH, UA: 7.5 (ref 5.0–8.0)

## 2020-07-02 MED ORDER — NITROFURANTOIN MONOHYD MACRO 100 MG PO CAPS: 100.0000 mg | ORAL_CAPSULE | Freq: Two times a day (BID) | ORAL | 0 refills | Status: DC
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
## 2020-07-04 LAB — URINE CULTURE

## 2020-07-10 DIAGNOSIS — Z01419 Encounter for gynecological examination (general) (routine) without abnormal findings: Secondary | ICD-10-CM | POA: Diagnosis not present

## 2020-07-10 DIAGNOSIS — Z23 Encounter for immunization: Secondary | ICD-10-CM | POA: Diagnosis not present

## 2020-07-10 DIAGNOSIS — Z1231 Encounter for screening mammogram for malignant neoplasm of breast: Secondary | ICD-10-CM | POA: Diagnosis not present

## 2020-08-11 ENCOUNTER — Other Ambulatory Visit: Payer: Self-pay | Admitting: Internal Medicine

## 2020-08-11 ENCOUNTER — Telehealth: Payer: Self-pay | Admitting: Internal Medicine

## 2020-08-11 NOTE — Telephone Encounter (Signed)
Patient came into the office, believes she has a UTI. Requesting same meds that Mickel Baas prescribed a month ago:  nitrofurantoin, macrocrystal-monohydrate, (MACROBID) 100 MG capsule 10 capsule 0     Sig: Take 1 capsule (100 mg total) by mouth 2 (two) times daily.    [facility] [place], [place] Phone:  [phone]  Fax:  [phone]     Patient states she also sent Dr. Alain Marion a message on my chart

## 2020-08-12 MED ORDER — NITROFURANTOIN MONOHYD MACRO 100 MG PO CAPS: 100.0000 mg | ORAL_CAPSULE | Freq: Two times a day (BID) | ORAL | 0 refills | Status: DC

## 2020-08-12 NOTE — Telephone Encounter (Signed)
Notified pt w/MD response.../lmb 

## 2020-08-12 NOTE — Telephone Encounter (Signed)
Evelyn Murphy  Thanks office visit if not better Thanks

## 2020-09-04 ENCOUNTER — Ambulatory Visit: Payer: Medicare Other | Admitting: Internal Medicine

## 2020-09-07 ENCOUNTER — Other Ambulatory Visit: Payer: Self-pay | Admitting: Internal Medicine

## 2020-09-07 DIAGNOSIS — R739 Hyperglycemia, unspecified: Secondary | ICD-10-CM

## 2020-09-07 DIAGNOSIS — D649 Anemia, unspecified: Secondary | ICD-10-CM

## 2020-09-07 DIAGNOSIS — E785 Hyperlipidemia, unspecified: Secondary | ICD-10-CM

## 2020-09-09 ENCOUNTER — Other Ambulatory Visit (INDEPENDENT_AMBULATORY_CARE_PROVIDER_SITE_OTHER): Payer: Medicare Other

## 2020-09-09 DIAGNOSIS — E785 Hyperlipidemia, unspecified: Secondary | ICD-10-CM

## 2020-09-09 DIAGNOSIS — R739 Hyperglycemia, unspecified: Secondary | ICD-10-CM

## 2020-09-09 DIAGNOSIS — D649 Anemia, unspecified: Secondary | ICD-10-CM | POA: Diagnosis not present

## 2020-09-09 LAB — COMPREHENSIVE METABOLIC PANEL
ALT: 26 U/L (ref 0–35)
AST: 26 U/L (ref 0–37)
Albumin: 4.5 g/dL (ref 3.5–5.2)
Alkaline Phosphatase: 78 U/L (ref 39–117)
BUN: 18 mg/dL (ref 6–23)
CO2: 27 mEq/L (ref 19–32)
Calcium: 10.1 mg/dL (ref 8.4–10.5)
Chloride: 104 mEq/L (ref 96–112)
Creatinine, Ser: 0.86 mg/dL (ref 0.40–1.20)
GFR: 67.19 mL/min (ref >60.00)
Glucose, Bld: 111 mg/dL — ABNORMAL HIGH (ref 70–99)
Potassium: 4.1 mEq/L (ref 3.5–5.1)
Sodium: 141 mEq/L (ref 135–145)
Total Bilirubin: 0.4 mg/dL (ref 0.2–1.2)
Total Protein: 7.2 g/dL (ref 6.0–8.3)

## 2020-09-09 LAB — CBC WITH DIFFERENTIAL/PLATELET
Basophils Absolute: 0 10*3/uL (ref 0.0–0.1)
Basophils Relative: 0.8 % (ref 0.0–3.0)
Eosinophils Absolute: 0.3 10*3/uL (ref 0.0–0.7)
Eosinophils Relative: 5.7 % — ABNORMAL HIGH (ref 0.0–5.0)
HCT: 38 % (ref 36.0–46.0)
Hemoglobin: 12.9 g/dL (ref 12.0–15.0)
Lymphocytes Relative: 28.5 % (ref 12.0–46.0)
Lymphs Abs: 1.5 10*3/uL (ref 0.7–4.0)
MCHC: 33.9 g/dL (ref 30.0–36.0)
MCV: 85.5 fl (ref 78.0–100.0)
Monocytes Absolute: 0.4 10*3/uL (ref 0.1–1.0)
Monocytes Relative: 6.9 % (ref 3.0–12.0)
Neutro Abs: 3 10*3/uL (ref 1.4–7.7)
Neutrophils Relative %: 58.1 % (ref 43.0–77.0)
Platelets: 276 10*3/uL (ref 150.0–400.0)
RBC: 4.44 Mil/uL (ref 3.87–5.11)
RDW: 12.9 % (ref 11.5–15.5)
WBC: 5.2 10*3/uL (ref 4.0–10.5)

## 2020-09-09 LAB — LIPID PANEL
Cholesterol: 160 mg/dL (ref 0–200)
HDL: 74.7 mg/dL (ref >39.00)
LDL Cholesterol: 66 mg/dL (ref 0–99)
NonHDL: 84.98
Total CHOL/HDL Ratio: 2
Triglycerides: 95 mg/dL (ref 0.0–149.0)
VLDL: 19 mg/dL (ref 0.0–40.0)

## 2020-09-09 LAB — URINALYSIS
Bilirubin Urine: NEGATIVE
Hgb urine dipstick: NEGATIVE
Ketones, ur: NEGATIVE
Leukocytes,Ua: NEGATIVE
Nitrite: NEGATIVE
Specific Gravity, Urine: 1.02 (ref 1.000–1.030)
Total Protein, Urine: NEGATIVE
Urine Glucose: NEGATIVE
Urobilinogen, UA: 0.2 (ref 0.0–1.0)
pH: 6.5 (ref 5.0–8.0)

## 2020-09-09 LAB — HEMOGLOBIN A1C: Hgb A1c MFr Bld: 6.8 % — ABNORMAL HIGH (ref 4.6–6.5)

## 2020-09-09 LAB — TSH: TSH: 1.73 u[IU]/mL (ref 0.35–4.50)

## 2020-09-11 ENCOUNTER — Encounter: Payer: Self-pay | Admitting: Internal Medicine

## 2020-09-11 ENCOUNTER — Other Ambulatory Visit: Payer: Self-pay

## 2020-09-11 ENCOUNTER — Ambulatory Visit (INDEPENDENT_AMBULATORY_CARE_PROVIDER_SITE_OTHER): Payer: Medicare Other | Admitting: Internal Medicine

## 2020-09-11 VITALS — BP 112/72 | HR 86 | Temp 98.2°F | Ht 63.0 in

## 2020-09-11 DIAGNOSIS — Z1211 Encounter for screening for malignant neoplasm of colon: Secondary | ICD-10-CM

## 2020-09-11 DIAGNOSIS — E785 Hyperlipidemia, unspecified: Secondary | ICD-10-CM | POA: Diagnosis not present

## 2020-09-11 DIAGNOSIS — K5909 Other constipation: Secondary | ICD-10-CM

## 2020-09-11 DIAGNOSIS — S92125D Nondisplaced fracture of body of left talus, subsequent encounter for fracture with routine healing: Secondary | ICD-10-CM | POA: Diagnosis not present

## 2020-09-11 DIAGNOSIS — R7989 Other specified abnormal findings of blood chemistry: Secondary | ICD-10-CM

## 2020-09-11 NOTE — Progress Notes (Signed)
Subjective:  Patient ID: Evelyn Murphy, female    DOB: 04-Aug-1945  Age: 75 y.o. MRN: [id number]  CC: Follow-up (6 month f/u)   HPI Evelyn Murphy presents for hyperlipidemia, ankle fracture, constipation follow-up.   Outpatient Medications Prior to Visit  Medication Sig Dispense Refill   aspirin EC 81 MG tablet Take 1 tablet (81 mg total) by mouth daily. 30 tablet 0   citalopram (CELEXA) 40 MG tablet TAKE 1 TABLET BY MOUTH EVERY DAY 90 tablet 3   doxepin (SINEQUAN) 75 MG capsule TAKE 1 OR 2 CAPSULES AT BEDTIME 180 capsule 1   ezetimibe (ZETIA) 10 MG tablet TAKE 1 TABLET BY MOUTH EVERY DAY 90 tablet 3   Fluticasone Furoate (ARNUITY ELLIPTA) 100 MCG/ACT AEPB Inhale 1 puff into the lungs daily. 30 each 1   LINZESS 290 MCG CAPS capsule TAKE 1 CAPSULE (290 MCG TOTAL) BY MOUTH DAILY. 90 capsule 3   Multiple Vitamins-Minerals (MULTIVITAMIN WITH MINERALS) tablet Take 1 tablet by mouth daily.      pantoprazole (PROTONIX) 40 MG tablet Take 40 mg by mouth as needed.     Potassium (POTASSIMIN PO) Take by mouth.     rosuvastatin (CRESTOR) 10 MG tablet TAKE 1 TABLET BY MOUTH EVERY DAY 90 tablet 3   UNABLE TO FIND Med Name: Valerian Root 2 per day     VENTOLIN HFA 108 (90 Base) MCG/ACT inhaler Inhale 2 puffs into the lungs every 6 (six) hours as needed.      nitrofurantoin, macrocrystal-monohydrate, (MACROBID) 100 MG capsule Take 1 capsule (100 mg total) by mouth 2 (two) times daily. (Patient not taking: Reported on 09/11/2020) 10 capsule 0   No facility-administered medications prior to visit.    ROS: Review of Systems  Constitutional:  Negative for activity change, appetite change, chills, fatigue and unexpected weight change.  HENT:  Negative for congestion, mouth sores and sinus pressure.   Eyes:  Negative for visual disturbance.  Respiratory:  Negative for cough and chest tightness.   Gastrointestinal:  Negative for abdominal pain and nausea.  Genitourinary:  Negative for difficulty  urinating, frequency and vaginal pain.  Musculoskeletal:  Negative for arthralgias, back pain and gait problem.  Skin:  Negative for pallor and rash.  Neurological:  Negative for dizziness, tremors, weakness, numbness and headaches.  Psychiatric/Behavioral:  Negative for confusion and sleep disturbance.    Objective:  BP 112/72 (BP Location: Left Arm)   Pulse 86   Temp 98.2 F (36.8 C) (Oral)   Ht 5\' 3"  (1.6 m)   SpO2 95%   BMI 24.80 kg/m   BP Readings from Last 3 Encounters:  09/11/20 112/72  07/02/20 110/60  04/10/20 92/74    Wt Readings from Last 3 Encounters:  04/10/20 140 lb (63.5 kg)  02/19/20 155 lb (70.3 kg)  01/22/20 155 lb (70.3 kg)    Physical Exam Constitutional:      General: She is not in acute distress.    Appearance: She is well-developed. She is obese.  HENT:     Head: Normocephalic.     Right Ear: External ear normal.     Left Ear: External ear normal.     Nose: Nose normal.  Eyes:     General:        Right eye: No discharge.        Left eye: No discharge.     Conjunctiva/sclera: Conjunctivae normal.     Pupils: Pupils are equal, round, and reactive to light.  Neck:  Thyroid: No thyromegaly.     Vascular: No JVD.     Trachea: No tracheal deviation.  Cardiovascular:     Rate and Rhythm: Normal rate and regular rhythm.     Heart sounds: Normal heart sounds.  Pulmonary:     Effort: No respiratory distress.     Breath sounds: No stridor. No wheezing.  Abdominal:     General: Bowel sounds are normal. There is no distension.     Palpations: Abdomen is soft. There is no mass.     Tenderness: There is no abdominal tenderness. There is no guarding or rebound.  Musculoskeletal:        General: No tenderness.     Cervical back: Normal range of motion and neck supple. No rigidity.  Lymphadenopathy:     Cervical: No cervical adenopathy.  Skin:    Findings: No erythema or rash.  Neurological:     Mental Status: She is oriented to person, place,  and time.     Cranial Nerves: No cranial nerve deficit.     Motor: No abnormal muscle tone.     Coordination: Coordination normal.     Deep Tendon Reflexes: Reflexes normal.  Psychiatric:        Behavior: Behavior normal.        Thought Content: Thought content normal.        Judgment: Judgment normal.    Lab Results  Component Value Date   WBC 5.2 09/09/2020   HGB 12.9 09/09/2020   HCT 38.0 09/09/2020   PLT 276.0 09/09/2020   GLUCOSE 111 (H) 09/09/2020   CHOL 160 09/09/2020   TRIG 95.0 09/09/2020   HDL 74.70 09/09/2020   LDLDIRECT 135.5 01/24/2013   LDLCALC 66 09/09/2020   ALT 26 09/09/2020   AST 26 09/09/2020   NA 141 09/09/2020   K 4.1 09/09/2020   CL 104 09/09/2020   CREATININE 0.86 09/09/2020   BUN 18 09/09/2020   CO2 27 09/09/2020   TSH 1.73 09/09/2020   INR 0.96 06/29/2014   HGBA1C 6.8 (H) 09/09/2020   MICROALBUR <0.7 03/05/2020    No results found.  Assessment & Plan:     Walker Kehr, MD

## 2020-09-11 NOTE — Assessment & Plan Note (Signed)
Eosinophilia. Mild and chronic ?etiol - repeat q 6 mo

## 2020-09-11 NOTE — Assessment & Plan Note (Signed)
On Crestor + Zetia. LDL at goal.

## 2020-09-11 NOTE — Assessment & Plan Note (Signed)
On Linzess 

## 2020-09-23 DIAGNOSIS — Z20822 Contact with and (suspected) exposure to covid-19: Secondary | ICD-10-CM | POA: Diagnosis not present

## 2020-09-24 DIAGNOSIS — Z20822 Contact with and (suspected) exposure to covid-19: Secondary | ICD-10-CM | POA: Diagnosis not present

## 2020-10-03 DIAGNOSIS — Z20822 Contact with and (suspected) exposure to covid-19: Secondary | ICD-10-CM | POA: Diagnosis not present

## 2020-11-13 ENCOUNTER — Ambulatory Visit (INDEPENDENT_AMBULATORY_CARE_PROVIDER_SITE_OTHER): Payer: Medicare Other | Admitting: Otolaryngology

## 2020-11-13 ENCOUNTER — Other Ambulatory Visit: Payer: Self-pay

## 2020-11-13 DIAGNOSIS — H6123 Impacted cerumen, bilateral: Secondary | ICD-10-CM

## 2020-11-13 NOTE — Progress Notes (Signed)
HPI: Evelyn Murphy is a 75 y.o. female who presents for evaluation of wax buildup in her ear is referred by her PCP.  She is not experiencing hearing problems.  She does not use Q-tips.  Past Medical History:  Diagnosis Date   Anxiety    Asthma    Confusion 2016   admitted - ? TIA - NOT - was overmedication issue   Depression    Hyperlipidemia    Insomnia    RBBB    Past Surgical History:  Procedure Laterality Date   ANKLE ARTHROSCOPY Left 12/18/2019   Procedure: LEFT ANKLE ARTHROSCOPY AND DEBRIDEMENT OF SUBTALAR JOINT DEBRIDEMENT ANKLE;  Surgeon: Newt Minion, MD;  Location: [facility];  Service: Orthopedics;  Laterality: Left;   COLONOSCOPY     SHOULDER ARTHROSCOPY  10/2011   R Dr Meyer Cory   Social History   Socioeconomic History   Marital status: Married    [name]   Number of children: 2   Years of education: Not on file   Highest education level: Not on file  Occupational History   Occupation: Retired  Tobacco Use   Smoking status: Never   Smokeless tobacco: Never  Vaping Use   Vaping Use: Never used  Substance and Sexual Activity   Alcohol use: No    Alcohol/week: 0.0 standard drinks   Drug use: No   Sexual activity: Yes  Other Topics Concern   Not on file  Social History Narrative   Married retired 2 daughters   Social Determinants of Radio broadcast assistant Strain: Not on file  Food Insecurity: Not on file  Transportation Needs: Not on file  Physical Activity: Not on file  Stress: Not on file  Social Connections: Not on file   Family History  Problem Relation Age of Onset   Heart disease Mother 33       CABG   Diabetes Mother    Macular degeneration Mother    Heart disease Father        Father died of MI at 81   COPD Father    Heart disease Brother    Diabetes Brother    Macular degeneration Brother    Allergies  Allergen Reactions   Linzess [Linaclotide]     Too strong   Lipitor [Atorvastatin]      elev LFTs   Prior to Admission medications   Medication Sig Start Date End Date Taking? Authorizing Provider  aspirin EC 81 MG tablet Take 1 tablet (81 mg total) by mouth daily. 07/01/14   Bonnielee Haff, MD  citalopram (CELEXA) 40 MG tablet TAKE 1 TABLET BY MOUTH EVERY DAY 08/11/20   Plotnikov, [name], MD  doxepin (SINEQUAN) 75 MG capsule TAKE 1 OR 2 CAPSULES AT BEDTIME 06/16/20   Plotnikov, [name], MD  ezetimibe (ZETIA) 10 MG tablet TAKE 1 TABLET BY MOUTH EVERY DAY 06/02/20   [name], Wonda Cheng, MD  Fluticasone Furoate (ARNUITY ELLIPTA) 100 MCG/ACT AEPB Inhale 1 puff into the lungs daily. 05/30/18   Fenton Foy, NP  LINZESS 290 MCG CAPS capsule TAKE 1 CAPSULE (290 MCG TOTAL) BY MOUTH DAILY. 04/01/20   Plotnikov, [name], MD  Multiple Vitamins-Minerals (MULTIVITAMIN WITH MINERALS) tablet Take 1 tablet by mouth daily.     [provider]  pantoprazole (PROTONIX) 40 MG tablet Take 40 mg by mouth as needed.    [provider]  Potassium (POTASSIMIN PO) Take by mouth.    [provider]  rosuvastatin (CRESTOR) 10 MG tablet TAKE 1 TABLET BY MOUTH EVERY DAY 05/26/20   [name], Wonda Cheng, MD  UNABLE TO FIND Med Name: Valerian Root 2 per day    [provider]  VENTOLIN HFA 108 (90 Base) MCG/ACT inhaler Inhale 2 puffs into the lungs every 6 (six) hours as needed.  02/18/16   [provider]     Positive ROS: Otherwise negative  All other systems have been reviewed and were otherwise negative with the exception of those mentioned in the HPI and as above.  Physical Exam: Constitutional: Alert, well-appearing, no acute distress Ears: External ears without lesions or tenderness. Ear canals with a mod amount of wax in the lateral portion of the ear canals that was cleaned with curette and forceps.  Ear canals and TMs were otherwise clear.. Nasal: External nose without lesions. Clear nasal passages Oral: Oropharynx clear. Neck: No palpable adenopathy or  masses Respiratory: Breathing comfortably  Skin: No facial/neck lesions or rash noted.  Cerumen impaction removal  Date/Time: 11/13/2020 1:07 PM Performed by: Rozetta Nunnery, MD Authorized by: Rozetta Nunnery, MD   Consent:    Consent obtained:  Verbal   Consent given by:  Patient   Risks discussed:  Pain and bleeding Procedure details:    Location:  L ear and R ear   Procedure type: curette and forceps   Post-procedure details:    Inspection:  TM intact and canal normal   Hearing quality:  Improved   Procedure completion:  Tolerated well, no immediate complications Comments:     TMs are otherwise clear  Assessment: Moderate wax and hairs in both ear canals that was cleaned in the office with a curette and forceps.  TMs are otherwise clear.  Plan: She will follow-up as needed  Radene Journey, MD

## 2020-12-02 DIAGNOSIS — Z23 Encounter for immunization: Secondary | ICD-10-CM | POA: Diagnosis not present

## 2020-12-25 DIAGNOSIS — H25013 Cortical age-related cataract, bilateral: Secondary | ICD-10-CM | POA: Diagnosis not present

## 2020-12-25 DIAGNOSIS — Z83518 Family history of other specified eye disorder: Secondary | ICD-10-CM | POA: Diagnosis not present

## 2020-12-25 DIAGNOSIS — H2513 Age-related nuclear cataract, bilateral: Secondary | ICD-10-CM | POA: Diagnosis not present

## 2021-01-01 ENCOUNTER — Encounter: Payer: Self-pay | Admitting: Internal Medicine

## 2021-01-06 DIAGNOSIS — Z20822 Contact with and (suspected) exposure to covid-19: Secondary | ICD-10-CM | POA: Diagnosis not present

## 2021-01-20 ENCOUNTER — Ambulatory Visit (AMBULATORY_SURGERY_CENTER): Payer: Medicare Other

## 2021-01-20 ENCOUNTER — Other Ambulatory Visit: Payer: Self-pay

## 2021-01-20 VITALS — Ht 63.0 in | Wt 135.0 lb

## 2021-01-20 DIAGNOSIS — Z1211 Encounter for screening for malignant neoplasm of colon: Secondary | ICD-10-CM

## 2021-01-20 NOTE — Progress Notes (Signed)
Pre visit completed via phone call; Patient verified name, DOB, and address; No egg or soy allergy known to patient  No issues known to pt with past sedation with any surgeries or procedures Patient denies ever being told they had issues or difficulty with intubation  No FH of Malignant Hyperthermia Pt is not on diet pills Pt is not on home 02  Pt is not on blood thinners  Pt reports issues with constipation when not taking the Linzess ( not taking every day) No A fib or A flutter Pt is fully vaccinated for Covid x 2; NO PA's for preps discussed with pt in PV today  Discussed with pt there will be an out-of-pocket cost for prep and that varies from $0 to 70 + dollars - pt verbalized understanding  Due to the COVID-19 pandemic we are asking patients to follow certain guidelines in PV and the [place]   Pt aware of COVID protocols and [facility] guidelines

## 2021-02-05 ENCOUNTER — Encounter: Payer: Self-pay | Admitting: Internal Medicine

## 2021-02-05 ENCOUNTER — Ambulatory Visit (AMBULATORY_SURGERY_CENTER): Payer: Medicare Other | Admitting: Internal Medicine

## 2021-02-05 ENCOUNTER — Other Ambulatory Visit: Payer: Self-pay

## 2021-02-05 VITALS — BP 116/57 | HR 56 | Temp 96.6°F | Resp 16 | Ht 63.0 in | Wt 135.0 lb

## 2021-02-05 DIAGNOSIS — Z1211 Encounter for screening for malignant neoplasm of colon: Secondary | ICD-10-CM

## 2021-02-05 DIAGNOSIS — F32A Depression, unspecified: Secondary | ICD-10-CM | POA: Diagnosis not present

## 2021-02-05 DIAGNOSIS — F419 Anxiety disorder, unspecified: Secondary | ICD-10-CM | POA: Diagnosis not present

## 2021-02-05 MED ORDER — SODIUM CHLORIDE 0.9 % IV SOLN: 500.0000 mL | Freq: Once | INTRAVENOUS | Status: DC

## 2021-02-05 NOTE — Progress Notes (Signed)
[facility] History and Physical   Primary Care Physician:  Cassandria Anger, MD   Reason for Procedure:  Colon cancer screening  Plan:    Colonoscopy     HPI: Evelyn Murphy is a 75 y.o. female presenting for colon cancer screening with colonoscopy.  Last exam 2011 without polyps.  She did have an EGD with changes of GERD plus stricture (dilated) and gastritis in 2018.   Past Medical History:  Diagnosis Date   Anxiety    on meds   Asthma    uses inhaler   Confusion 2016   admitted - ? TIA - NOT - was overmedication issue   Depression    on meds   Hyperlipidemia    on meds   Insomnia    RBBB    Seasonal allergies     Past Surgical History:  Procedure Laterality Date   ANKLE ARTHROSCOPY Left 12/18/2019   Procedure: LEFT ANKLE ARTHROSCOPY AND DEBRIDEMENT OF SUBTALAR JOINT DEBRIDEMENT ANKLE;  Surgeon: Newt Minion, MD;  Location: [facility];  Service: Orthopedics;  Laterality: Left;   COLONOSCOPY  2011   CG-F/V-mov(exc)-normal - 10 yr   SHOULDER ARTHROSCOPY Right 10/21/2011   Dr Meyer Cory    Prior to Admission medications   Medication Sig Start Date End Date Taking? Authorizing Provider  citalopram (CELEXA) 40 MG tablet TAKE 1 TABLET BY MOUTH EVERY DAY 08/11/20  Yes Plotnikov, [name], MD  doxepin (SINEQUAN) 75 MG capsule TAKE 1 OR 2 CAPSULES AT BEDTIME 06/16/20  Yes Plotnikov, [name], MD  ezetimibe (ZETIA) 10 MG tablet TAKE 1 TABLET BY MOUTH EVERY DAY 06/02/20  Yes [name], Wonda Cheng, MD  Multiple Vitamins-Minerals (MULTIVITAMIN WITH MINERALS) tablet Take 1 tablet by mouth daily.    Yes [provider]  rosuvastatin (CRESTOR) 10 MG tablet TAKE 1 TABLET BY MOUTH EVERY DAY 05/26/20  Yes [name], Wonda Cheng, MD  UNABLE TO FIND Med Name: Valerian Root 2 per day   Yes [provider]  aspirin EC 81 MG tablet Take 1 tablet (81 mg total) by mouth daily. 07/01/14   Bonnielee Haff, MD  Fluticasone Furoate (ARNUITY ELLIPTA) 100  MCG/ACT AEPB Inhale 1 puff into the lungs daily. Patient not taking: Reported on 02/05/2021 05/30/18   Fenton Foy, NP  pantoprazole (PROTONIX) 40 MG tablet Take 40 mg by mouth as needed. Patient not taking: Reported on 02/05/2021    [provider]    Current Outpatient Medications  Medication Sig Dispense Refill   citalopram (CELEXA) 40 MG tablet TAKE 1 TABLET BY MOUTH EVERY DAY 90 tablet 3   doxepin (SINEQUAN) 75 MG capsule TAKE 1 OR 2 CAPSULES AT BEDTIME 180 capsule 1   ezetimibe (ZETIA) 10 MG tablet TAKE 1 TABLET BY MOUTH EVERY DAY 90 tablet 3   Multiple Vitamins-Minerals (MULTIVITAMIN WITH MINERALS) tablet Take 1 tablet by mouth daily.      rosuvastatin (CRESTOR) 10 MG tablet TAKE 1 TABLET BY MOUTH EVERY DAY 90 tablet 3   UNABLE TO FIND Med Name: Valerian Root 2 per day     aspirin EC 81 MG tablet Take 1 tablet (81 mg total) by mouth daily. 30 tablet 0   Fluticasone Furoate (ARNUITY ELLIPTA) 100 MCG/ACT AEPB Inhale 1 puff into the lungs daily. (Patient not taking: Reported on 02/05/2021) 30 each 1   pantoprazole (PROTONIX) 40 MG tablet Take 40 mg by mouth as needed. (Patient not taking: Reported on 02/05/2021)     Current Facility-Administered  Medications  Medication Dose Route Frequency Provider Last Rate Last Admin   0.9 %  sodium chloride infusion  500 mL Intravenous Once Gatha Mayer, MD        Allergies as of 02/05/2021 - Review Complete 02/05/2021  Allergen Reaction Noted   Linzess [linaclotide]  07/31/2013   Lipitor [atorvastatin]  07/31/2013    Family History  Problem Relation Age of Onset   Heart disease Mother 25       CABG   Diabetes Mother    Macular degeneration Mother    Heart disease Father        Father died of MI at 79   COPD Father    Heart disease Brother    Diabetes Brother    Macular degeneration Brother    Colon polyps Neg Hx    Colon cancer Neg Hx    Esophageal cancer Neg Hx    Stomach cancer Neg Hx    Rectal cancer Neg Hx      Social History   Socioeconomic History   Marital status: Married    [name]   Number of children: 2   Years of education: Not on file   Highest education level: Not on file  Occupational History   Occupation: Retired  Tobacco Use   Smoking status: Never   Smokeless tobacco: Never  Vaping Use   Vaping Use: Never used  Substance and Sexual Activity   Alcohol use: No    Alcohol/week: 0.0 standard drinks   Drug use: No   Sexual activity: Yes        Social History Narrative   Married retired 2 daughters    Review of Systems:  All other review of systems negative except as mentioned in the HPI.  Physical Exam: Vital signs BP 110/65   Pulse 70   Temp (!) 96.6 F (35.9 C) (Skin)   Ht 5\' 3"  (1.6 m)   Wt 135 lb (61.2 kg)   SpO2 98%   BMI 23.91 kg/m   General:   Alert,  Well-developed, well-nourished, pleasant and cooperative in NAD Lungs:  Clear throughout to auscultation.   Heart:  Regular rate and rhythm; no murmurs, clicks, rubs,  or gallops. Abdomen:  Soft, nontender and nondistended. Normal bowel sounds.   Neuro/Psych:  Alert and cooperative. Normal mood and affect. A and O x 3   @Camber Ninh  Simonne Maffucci, MD, [facility] [phone] (pager) 02/05/2021 1:23 PM@

## 2021-02-05 NOTE — Patient Instructions (Addendum)
No polyps or cancer seen - looks normal.  Next colonoscopy only if needed - hope that does not occur!  I appreciate the opportunity to care for you. Gatha Mayer, MD, [facility] Please read handouts provided. Continue present medications.   YOU HAD AN ENDOSCOPIC PROCEDURE TODAY AT [facility]:   Refer to the procedure report that was given to you for any specific questions about what was found during the examination.  If the procedure report does not answer your questions, please call your gastroenterologist to clarify.  If you requested that your care partner not be given the details of your procedure findings, then the procedure report has been included in a sealed envelope for you to review at your convenience later.  YOU SHOULD EXPECT: Some feelings of bloating in the abdomen. Passage of more gas than usual.  Walking can help get rid of the air that was put into your GI tract during the procedure and reduce the bloating. If you had a lower endoscopy (such as a colonoscopy or flexible sigmoidoscopy) you may notice spotting of blood in your stool or on the toilet paper. If you underwent a bowel prep for your procedure, you may not have a normal bowel movement for a few days.  Please Note:  You might notice some irritation and congestion in your nose or some drainage.  This is from the oxygen used during your procedure.  There is no need for concern and it should clear up in a day or so.  SYMPTOMS TO REPORT IMMEDIATELY:  Following lower endoscopy (colonoscopy or flexible sigmoidoscopy):  Excessive amounts of blood in the stool  Significant tenderness or worsening of abdominal pains  Swelling of the abdomen that is new, acute  Fever of 100F or higher   For urgent or emergent issues, a gastroenterologist can be reached at any hour by calling [phone]. Do not use MyChart messaging for urgent concerns.    DIET:  We do recommend a small meal at first, but then you may  proceed to your regular diet.  Drink plenty of fluids but you should avoid alcoholic beverages for 24 hours.  ACTIVITY:  You should plan to take it easy for the rest of today and you should NOT DRIVE or use heavy machinery until tomorrow (because of the sedation medicines used during the test).    FOLLOW UP: Our staff will call the number listed on your records 48-72 hours following your procedure to check on you and address any questions or concerns that you may have regarding the information given to you following your procedure. If we do not reach you, we will leave a message.  We will attempt to reach you two times.  During this call, we will ask if you have developed any symptoms of COVID 19. If you develop any symptoms (ie: fever, flu-like symptoms, shortness of breath, cough etc.) before then, please call [phone].  If you test positive for Covid 19 in the 2 weeks post procedure, please call and report this information to [place].    If any biopsies were taken you will be contacted by phone or by letter within the next 1-3 weeks.  Please call us at [phone] if you have not heard about the biopsies in 3 weeks.    SIGNATURES/CONFIDENTIALITY: You and/or your care partner have signed paperwork which will be entered into your electronic medical record.  These signatures attest to the fact that that the information above on your After  Visit Summary has been reviewed and is understood.  Full responsibility of the confidentiality of this discharge information lies with you and/or your care-partner.

## 2021-02-05 NOTE — Op Note (Signed)
[facility] Patient Name: Evelyn Murphy Procedure Date: 02/05/2021 1:38 PM MRN: [id number] Endoscopist: Gatha Mayer , MD Age: 75 Referring MD:  Date of Birth: [birth year] Gender: Female Account #: [id number] Procedure:                Colonoscopy Indications:              Screening for colorectal malignant neoplasm, Last                            colonoscopy: 2011 Medicines:                Propofol per Anesthesia, Monitored Anesthesia Care Procedure:                Pre-Anesthesia Assessment:                           - Prior to the procedure, a History and Physical                            was performed, and patient medications and                            allergies were reviewed. The patient's tolerance of                            previous anesthesia was also reviewed. The risks                            and benefits of the procedure and the sedation                            options and risks were discussed with the patient.                            All questions were answered, and informed consent                            was obtained. Prior Anticoagulants: The patient has                            taken no previous anticoagulant or antiplatelet                            agents. ASA Grade Assessment: II - A patient with                            mild systemic disease. After reviewing the risks                            and benefits, the patient was deemed in                            satisfactory condition to undergo the procedure.  After obtaining informed consent, the colonoscope                            was passed under direct vision. Throughout the                            procedure, the patient's blood pressure, pulse, and                            oxygen saturations were monitored continuously. The                            Olympus PCF-H190DL ([id number]) Colonoscope was                            introduced through  the anus and advanced to the the                            cecum, identified by appendiceal orifice and                            ileocecal valve. The colonoscopy was somewhat                            difficult due to significant looping. Successful                            completion of the procedure was aided by applying                            abdominal pressure. The patient tolerated the                            procedure well. The quality of the bowel                            preparation was adequate. The bowel preparation                            used was Miralax via split dose instruction. Scope In: 1:42:53 PM Scope Out: 2:07:38 PM Scope Withdrawal Time: 0 hours 17 minutes 20 seconds  Total Procedure Duration: 0 hours 24 minutes 45 seconds  Findings:                 The perianal and digital rectal examinations were                            normal.                           The colon (entire examined portion) appeared normal.                           The exam was otherwise without abnormality on  direct and retroflexion views. Complications:            No immediate complications. Estimated Blood Loss:     Estimated blood loss: none. Impression:               - The entire examined colon is normal.                           - The examination was otherwise normal on direct                            and retroflexion views.                           - No specimens collected. Recommendation:           - Patient has a contact number available for                            emergencies. The signs and symptoms of potential                            delayed complications were discussed with the                            patient. Return to normal activities tomorrow.                            Written discharge instructions were provided to the                            patient.                           - Resume previous diet.                            - Continue present medications.                           - No repeat colonoscopy due to current age (30                            years or older) and the absence of colonic polyps. Gatha Mayer, MD 02/05/2021 2:12:14 PM This report has been signed electronically.

## 2021-02-05 NOTE — Progress Notes (Signed)
Pt stable to RR 

## 2021-02-05 NOTE — Progress Notes (Signed)
Pt's states no medical or surgical changes since previsit or office visit. VS assessed by D.T 

## 2021-02-09 ENCOUNTER — Telehealth: Payer: Self-pay | Admitting: *Deleted

## 2021-02-09 NOTE — Telephone Encounter (Signed)
Follow up call attempt.  LVM. 

## 2021-02-22 ENCOUNTER — Encounter: Payer: Self-pay | Admitting: Internal Medicine

## 2021-02-24 ENCOUNTER — Other Ambulatory Visit: Payer: Self-pay | Admitting: Internal Medicine

## 2021-02-24 DIAGNOSIS — Z20822 Contact with and (suspected) exposure to covid-19: Secondary | ICD-10-CM | POA: Diagnosis not present

## 2021-02-24 MED ORDER — NITROFURANTOIN MONOHYD MACRO 100 MG PO CAPS: 100.0000 mg | ORAL_CAPSULE | Freq: Two times a day (BID) | ORAL | 0 refills | Status: AC

## 2021-03-16 ENCOUNTER — Encounter: Payer: Self-pay | Admitting: Internal Medicine

## 2021-03-18 ENCOUNTER — Other Ambulatory Visit: Payer: Self-pay | Admitting: *Deleted

## 2021-03-18 ENCOUNTER — Other Ambulatory Visit (INDEPENDENT_AMBULATORY_CARE_PROVIDER_SITE_OTHER): Payer: Medicare Other

## 2021-03-18 ENCOUNTER — Other Ambulatory Visit: Payer: Self-pay | Admitting: Internal Medicine

## 2021-03-18 DIAGNOSIS — E669 Obesity, unspecified: Secondary | ICD-10-CM

## 2021-03-18 DIAGNOSIS — D649 Anemia, unspecified: Secondary | ICD-10-CM

## 2021-03-18 DIAGNOSIS — E1169 Type 2 diabetes mellitus with other specified complication: Secondary | ICD-10-CM

## 2021-03-18 DIAGNOSIS — E785 Hyperlipidemia, unspecified: Secondary | ICD-10-CM

## 2021-03-18 LAB — LIPID PANEL
Cholesterol: 150 mg/dL (ref 0–200)
HDL: 66.4 mg/dL (ref >39.00)
LDL Cholesterol: 67 mg/dL (ref 0–99)
NonHDL: 83.18
Total CHOL/HDL Ratio: 2
Triglycerides: 81 mg/dL (ref 0.0–149.0)
VLDL: 16.2 mg/dL (ref 0.0–40.0)

## 2021-03-18 LAB — CBC WITH DIFFERENTIAL/PLATELET
Basophils Absolute: 0.1 10*3/uL (ref 0.0–0.1)
Basophils Relative: 1.3 % (ref 0.0–3.0)
Eosinophils Absolute: 0.2 10*3/uL (ref 0.0–0.7)
Eosinophils Relative: 4.3 % (ref 0.0–5.0)
HCT: 37.7 % (ref 36.0–46.0)
Hemoglobin: 12.4 g/dL (ref 12.0–15.0)
Lymphocytes Relative: 30.7 % (ref 12.0–46.0)
Lymphs Abs: 1.6 10*3/uL (ref 0.7–4.0)
MCHC: 32.8 g/dL (ref 30.0–36.0)
MCV: 87.1 fl (ref 78.0–100.0)
Monocytes Absolute: 0.4 10*3/uL (ref 0.1–1.0)
Monocytes Relative: 8.2 % (ref 3.0–12.0)
Neutro Abs: 2.8 10*3/uL (ref 1.4–7.7)
Neutrophils Relative %: 55.5 % (ref 43.0–77.0)
Platelets: 276 10*3/uL (ref 150.0–400.0)
RBC: 4.33 Mil/uL (ref 3.87–5.11)
RDW: 12.6 % (ref 11.5–15.5)
WBC: 5.1 10*3/uL (ref 4.0–10.5)

## 2021-03-18 LAB — URINALYSIS
Bilirubin Urine: NEGATIVE
Hgb urine dipstick: NEGATIVE
Ketones, ur: NEGATIVE
Leukocytes,Ua: NEGATIVE
Nitrite: NEGATIVE
Specific Gravity, Urine: 1.02 (ref 1.000–1.030)
Total Protein, Urine: NEGATIVE
Urine Glucose: NEGATIVE
Urobilinogen, UA: 0.2 (ref 0.0–1.0)
pH: 7.5 (ref 5.0–8.0)

## 2021-03-18 LAB — TSH: TSH: 2.86 u[IU]/mL (ref 0.35–5.50)

## 2021-03-18 LAB — COMPREHENSIVE METABOLIC PANEL
ALT: 29 U/L (ref 0–35)
AST: 28 U/L (ref 0–37)
Albumin: 4.3 g/dL (ref 3.5–5.2)
Alkaline Phosphatase: 87 U/L (ref 39–117)
BUN: 19 mg/dL (ref 6–23)
CO2: 31 mEq/L (ref 19–32)
Calcium: 10 mg/dL (ref 8.4–10.5)
Chloride: 102 mEq/L (ref 96–112)
Creatinine, Ser: 0.87 mg/dL (ref 0.40–1.20)
GFR: 66.02 mL/min (ref >60.00)
Glucose, Bld: 104 mg/dL — ABNORMAL HIGH (ref 70–99)
Potassium: 4.4 mEq/L (ref 3.5–5.1)
Sodium: 140 mEq/L (ref 135–145)
Total Bilirubin: 0.4 mg/dL (ref 0.2–1.2)
Total Protein: 7.2 g/dL (ref 6.0–8.3)

## 2021-03-18 LAB — HEMOGLOBIN A1C: Hgb A1c MFr Bld: 6.6 % — ABNORMAL HIGH (ref 4.6–6.5)

## 2021-03-19 ENCOUNTER — Other Ambulatory Visit: Payer: Self-pay

## 2021-03-19 ENCOUNTER — Ambulatory Visit (INDEPENDENT_AMBULATORY_CARE_PROVIDER_SITE_OTHER): Payer: Medicare Other | Admitting: Internal Medicine

## 2021-03-19 ENCOUNTER — Encounter: Payer: Self-pay | Admitting: Internal Medicine

## 2021-03-19 DIAGNOSIS — G8929 Other chronic pain: Secondary | ICD-10-CM | POA: Diagnosis not present

## 2021-03-19 DIAGNOSIS — M545 Low back pain, unspecified: Secondary | ICD-10-CM | POA: Diagnosis not present

## 2021-03-19 DIAGNOSIS — F418 Other specified anxiety disorders: Secondary | ICD-10-CM

## 2021-03-19 DIAGNOSIS — Z7184 Encounter for health counseling related to travel: Secondary | ICD-10-CM | POA: Diagnosis not present

## 2021-03-19 DIAGNOSIS — E785 Hyperlipidemia, unspecified: Secondary | ICD-10-CM

## 2021-03-19 DIAGNOSIS — R7989 Other specified abnormal findings of blood chemistry: Secondary | ICD-10-CM | POA: Diagnosis not present

## 2021-03-19 DIAGNOSIS — F5101 Primary insomnia: Secondary | ICD-10-CM

## 2021-03-19 MED ORDER — CIPROFLOXACIN HCL 250 MG PO TABS: 250.0000 mg | ORAL_TABLET | Freq: Two times a day (BID) | ORAL | 0 refills | Status: DC

## 2021-03-19 NOTE — Assessment & Plan Note (Signed)
Going on a reposition cruise from [place] to [place] soon - 21 days Cipro prn

## 2021-03-19 NOTE — Assessment & Plan Note (Signed)
Worse. Will switch to Ambien CR

## 2021-03-19 NOTE — Assessment & Plan Note (Signed)
Eosinophilia. Mild and chronic ?etiol - repeat q 6 mo

## 2021-03-19 NOTE — Assessment & Plan Note (Signed)
Citalopram 

## 2021-03-19 NOTE — Progress Notes (Signed)
Subjective:  Patient ID: Evelyn Murphy, female    DOB: [birth year]  Age: 75 y.o. MRN: [id number]  CC: No chief complaint on file.   HPI Evelyn Murphy presents for HTN, dyslipidemia, insomnia Going on a reposition cruise from [facility] to [place] soon - 21 days  Outpatient Medications Prior to Visit  Medication Sig Dispense Refill   aspirin EC 81 MG tablet Take 1 tablet (81 mg total) by mouth daily. 30 tablet 0   citalopram (CELEXA) 40 MG tablet TAKE 1 TABLET BY MOUTH EVERY DAY 90 tablet 3   diclofenac (CATAFLAM) 50 MG tablet      doxepin (SINEQUAN) 75 MG capsule TAKE 1 OR 2 CAPSULES AT BEDTIME 180 capsule 1   ezetimibe (ZETIA) 10 MG tablet TAKE 1 TABLET BY MOUTH EVERY DAY 90 tablet 3   lactulose (CHRONULAC) 10 GM/15ML solution      Multiple Vitamins-Minerals (MULTIVITAMIN WITH MINERALS) tablet Take 1 tablet by mouth daily.      nortriptyline (PAMELOR) 50 MG capsule      rosuvastatin (CRESTOR) 10 MG tablet TAKE 1 TABLET BY MOUTH EVERY DAY 90 tablet 3   terbinafine (LAMISIL) 250 MG tablet      UNABLE TO FIND Med Name: Valerian Root 2 per day     Fluticasone Furoate (ARNUITY ELLIPTA) 100 MCG/ACT AEPB Inhale 1 puff into the lungs daily. (Patient not taking: Reported on 02/05/2021) 30 each 1   pantoprazole (PROTONIX) 40 MG tablet Take 40 mg by mouth as needed. (Patient not taking: Reported on 02/05/2021)     No facility-administered medications prior to visit.    ROS: Review of Systems  Constitutional:  Negative for activity change, appetite change, chills, fatigue and unexpected weight change.  HENT:  Negative for congestion, mouth sores and sinus pressure.   Eyes:  Negative for visual disturbance.  Respiratory:  Negative for cough and chest tightness.   Gastrointestinal:  Negative for abdominal pain and nausea.  Genitourinary:  Negative for difficulty urinating, frequency and vaginal pain.  Musculoskeletal:  Negative for back pain and gait problem.  Skin:  Negative for pallor and rash.   Neurological:  Negative for dizziness, tremors, weakness, numbness and headaches.  Psychiatric/Behavioral:  Negative for confusion, sleep disturbance and suicidal ideas.    Objective:  BP 124/78 (BP Location: Left Arm, Patient Position: Sitting, Cuff Size: Large)    Pulse 96    Temp 98.6 F (37 C) (Oral)    Ht 5\' 3"  (1.6 m)    Wt 135 lb (61.2 kg)    SpO2 97%    BMI 23.91 kg/m   BP Readings from Last 3 Encounters:  03/19/21 124/78  02/05/21 (!) 116/57  09/11/20 112/72    Wt Readings from Last 3 Encounters:  03/19/21 135 lb (61.2 kg)  02/05/21 135 lb (61.2 kg)  01/20/21 135 lb (61.2 kg)    Physical Exam Constitutional:      General: She is not in acute distress.    Appearance: She is well-developed.  HENT:     Head: Normocephalic.     Right Ear: External ear normal.     Left Ear: External ear normal.     Nose: Nose normal.  Eyes:     General:        Right eye: No discharge.        Left eye: No discharge.     Conjunctiva/sclera: Conjunctivae normal.     Pupils: Pupils are equal, round, and reactive to light.  Neck:  Thyroid: No thyromegaly.     Vascular: No JVD.     Trachea: No tracheal deviation.  Cardiovascular:     Rate and Rhythm: Normal rate and regular rhythm.     Heart sounds: Normal heart sounds.  Pulmonary:     Effort: No respiratory distress.     Breath sounds: No stridor. No wheezing.  Abdominal:     General: Bowel sounds are normal. There is no distension.     Palpations: Abdomen is soft. There is no mass.     Tenderness: There is no abdominal tenderness. There is no guarding or rebound.  Musculoskeletal:        General: No tenderness.     Cervical back: Normal range of motion and neck supple. No rigidity.  Lymphadenopathy:     Cervical: No cervical adenopathy.  Skin:    Findings: No erythema or rash.  Neurological:     Cranial Nerves: No cranial nerve deficit.     Motor: No abnormal muscle tone.     Coordination: Coordination normal.     Deep  Tendon Reflexes: Reflexes normal.  Psychiatric:        Behavior: Behavior normal.        Thought Content: Thought content normal.        Judgment: Judgment normal.    Lab Results  Component Value Date   WBC 5.1 03/18/2021   HGB 12.4 03/18/2021   HCT 37.7 03/18/2021   PLT 276.0 03/18/2021   GLUCOSE 104 (H) 03/18/2021   CHOL 150 03/18/2021   TRIG 81.0 03/18/2021   HDL 66.40 03/18/2021   LDLDIRECT 135.5 01/24/2013   LDLCALC 67 03/18/2021   ALT 29 03/18/2021   AST 28 03/18/2021   NA 140 03/18/2021   K 4.4 03/18/2021   CL 102 03/18/2021   CREATININE 0.87 03/18/2021   BUN 19 03/18/2021   CO2 31 03/18/2021   TSH 2.86 03/18/2021   INR 0.96 06/29/2014   HGBA1C 6.6 (H) 03/18/2021   MICROALBUR <0.7 03/05/2020    No results found.  Assessment & Plan:   Problem List Items Addressed This Visit     Abnormal CBC    Eosinophilia. Mild and chronic ?etiol - repeat q 6 mo      Back pain    Stretching exercises McKenzie pillow      Relevant Medications   diclofenac (CATAFLAM) 50 MG tablet   Depression with anxiety    Citalopram      Relevant Medications   nortriptyline (PAMELOR) 50 MG capsule   Dyslipidemia    On Crestor + Zetia. LDL at goal.      Insomnia    Worse. Will switch to Ambien CR      Travel advice encounter    Going on a reposition cruise from [facility] to [place] soon - 21 days Cipro prn         Meds ordered this encounter  Medications   ciprofloxacin (CIPRO) 250 MG tablet    Sig: Take 1 tablet (250 mg total) by mouth 2 (two) times daily.    Dispense:  20 tablet    Refill:  0      Follow-up: No follow-ups on file.  Walker Kehr, MD

## 2021-03-19 NOTE — Assessment & Plan Note (Signed)
On Crestor + Zetia. LDL at goal.

## 2021-03-19 NOTE — Assessment & Plan Note (Signed)
Stretching exercises McKenzie pillow

## 2021-03-19 NOTE — Patient Instructions (Signed)
For a mild COVID-19 case - take zinc 50 mg a day for 1 week, vitamin C 1000 mg daily for 1 week, vitamin D2 50,000 units weekly for 2 months (unless  taking vitamin D daily already), an antioxidant Quercetin 500 mg twice a day for 1 week (if you can get it quick enough). Take Allegra or Benadryl.  Maintain good oral hydration and take Tylenol for high fever.  Call if problems. Isolate for 5 days, then wear a mask for 5 days per CDC.  

## 2021-04-08 DIAGNOSIS — L258 Unspecified contact dermatitis due to other agents: Secondary | ICD-10-CM | POA: Diagnosis not present

## 2021-04-13 ENCOUNTER — Ambulatory Visit: Payer: Medicare Other | Admitting: Internal Medicine

## 2021-04-15 DIAGNOSIS — Z20822 Contact with and (suspected) exposure to covid-19: Secondary | ICD-10-CM | POA: Diagnosis not present

## 2021-05-02 ENCOUNTER — Other Ambulatory Visit: Payer: Self-pay | Admitting: Cardiovascular Disease

## 2021-05-11 ENCOUNTER — Encounter: Payer: Self-pay | Admitting: Internal Medicine

## 2021-05-12 NOTE — Telephone Encounter (Signed)
Patient is requesting a change in antibiotic medication for traveling. Patient states the pharmacist noted that it could react with another medication.

## 2021-05-16 ENCOUNTER — Other Ambulatory Visit: Payer: Self-pay | Admitting: Internal Medicine

## 2021-05-16 MED ORDER — CEFUROXIME AXETIL 250 MG PO TABS: 250.0000 mg | ORAL_TABLET | Freq: Two times a day (BID) | ORAL | 0 refills | Status: DC

## 2021-05-25 NOTE — Progress Notes (Signed)
?Cardiology Office Note:   ? ?Date:  05/28/2021  ? ?ID:  Evelyn Murphy, DOB [birth year], MRN [id number] ? ?PCP:  Plotnikov, [name], MD ?  ?[facility] Providers ?Cardiologist:  Werner Lean, MD    ? ?Referring MD: Cassandria Anger, MD  ? ?CC: Transition to new cardiologist ? ?History of Present Illness:   ? ?Evelyn Murphy is a 76 y.o. female with a FHX of CAD, [place], [place], [place] 2021. ? ?Patient notes that she is doing good.   ?Is a Copywriter, advertising.  Works out daily: one hour on a stationary bike and swims three times a week. ?There are no interval hospital/ED visit.   ? ?No chest pain or pressure .  No SOB/DOE and no PND/Orthopnea.  No weight gain or leg swelling.  No palpitations or syncope . ? ? ?Past Medical History:  ?Diagnosis Date  ? Anxiety   ? on meds  ? Asthma   ? uses inhaler  ? Confusion 2016  ? admitted - ? TIA - NOT - was overmedication issue  ? Depression   ? on meds  ? Hyperlipidemia   ? on meds  ? Insomnia   ? RBBB   ? Seasonal allergies   ? ? ?Past Surgical History:  ?Procedure Laterality Date  ? ANKLE ARTHROSCOPY Left 12/18/2019  ? Procedure: LEFT ANKLE ARTHROSCOPY AND DEBRIDEMENT OF SUBTALAR JOINT DEBRIDEMENT ANKLE;  Surgeon: Newt Minion, MD;  Location: [facility];  Service: Orthopedics;  Laterality: Left;  ? COLONOSCOPY  2011  ? CG-F/V-mov(exc)-normal - 10 yr  ? SHOULDER ARTHROSCOPY Right 10/21/2011  ? Dr Meyer Cory  ? ? ?Current Medications: ?Current Meds  ?Medication Sig  ? aspirin EC 81 MG tablet Take 1 tablet (81 mg total) by mouth daily.  ? citalopram (CELEXA) 40 MG tablet TAKE 1 TABLET BY MOUTH EVERY DAY  ? diclofenac (CATAFLAM) 50 MG tablet   ? doxepin (SINEQUAN) 75 MG capsule TAKE 1 OR 2 CAPSULES AT BEDTIME  ? ezetimibe (ZETIA) 10 MG tablet TAKE 1 TABLET BY MOUTH EVERY DAY  ? Multiple Vitamins-Minerals (MULTIVITAMIN WITH MINERALS) tablet Take 1 tablet by mouth daily.   ? rosuvastatin (CRESTOR) 10 MG tablet TAKE 1 TABLET BY MOUTH EVERY DAY  ? terbinafine  (LAMISIL) 250 MG tablet   ? UNABLE TO FIND Med Name: Valerian Root 2 per day  ?  ? ?Allergies:   Linzess [linaclotide] and Lipitor [atorvastatin]  ? ?Social History  ? ?Socioeconomic History  ? Marital status: Married  ?  [name]  ? Number of children: 2  ? Years of education: Not on file  ? Highest education level: Not on file  ?Occupational History  ? Occupation: Retired  ?Tobacco Use  ? Smoking status: Never  ? Smokeless tobacco: Never  ?Vaping Use  ? Vaping Use: Never used  ?Substance and Sexual Activity  ? Alcohol use: No  ?  Alcohol/week: 0.0 standard drinks  ? Drug use: No  ? Sexual activity: Yes  ?Other Topics Concern  ? Not on file  ?Social History Narrative  ? Married retired 2 daughters  ? ?Social Determinants of Health  ? ?Financial Resource Strain: Not on file  ?Food Insecurity: Not on file  ?Transportation Needs: Not on file  ?Physical Activity: Not on file  ?Stress: Not on file  ?Social Connections: Not on file  ?  ?Social: In May 2023 going on a quilting cruise ? ?Family History: ?The patient's family history  includes COPD in her father; Diabetes in her brother and mother; Heart disease in her brother and father; Heart disease (age of onset: 73) in her mother; Macular degeneration in her brother and mother. There is no history of Colon polyps, Colon cancer, Esophageal cancer, Stomach cancer, or Rectal cancer. ? ?ROS:   ?Please see the history of present illness.    ? All other systems reviewed and are negative. ? ?EKGs/Labs/Other Studies Reviewed:   ? ?The following studies were reviewed today: ? ?EKG:  EKG is  ordered today.  The ekg ordered today demonstrates  ?05/28/21: SR rate 78 RBBB ? ?Recent Labs: ?03/18/2021: ALT 29; BUN 19; Creatinine, Ser 0.87; Hemoglobin 12.4; Platelets 276.0; Potassium 4.4; Sodium 140; TSH 2.86  ?Recent Lipid Panel ?   ?Component Value Date/Time  ? CHOL 150 03/18/2021 1313  ? CHOL 150 08/30/2018 0959  ? CHOL 181 03/18/2014 1437  ? TRIG 81.0 03/18/2021 1313   ? TRIG 160 (H) 03/18/2014 1437  ? HDL 66.40 03/18/2021 1313  ? HDL 75 08/30/2018 0959  ? HDL 67 03/18/2014 1437  ? CHOLHDL 2 03/18/2021 1313  ? VLDL 16.2 03/18/2021 1313  ? [place] 67 03/18/2021 1313  ? [place] 60 08/30/2018 0959  ? [place] 82 03/18/2014 1437  ? LDLDIRECT 135.5 01/24/2013 1404  ?    ? ?Physical Exam:   ? ?VS:  BP 122/66   Pulse 78   Ht '5\' 2"'$  (1.575 m)   SpO2 95%   BMI 24.69 kg/m?    ? ?Wt Readings from Last 3 Encounters:  ?03/19/21 135 lb (61.2 kg)  ?02/05/21 135 lb (61.2 kg)  ?01/20/21 135 lb (61.2 kg)  ?  ?Gen: no distress   ?Neck: No JVD, no carotid bruit ?Ears: no Pilar Plate Sign ?Cardiac: No Rubs or Gallops, no Murmur, regular rhythm and +2 radial pulses ?Respiratory: Clear to auscultation bilaterally, normal effort, normal  respiratory rate ?GI: Soft, nontender, non-distended  ?MS: No  edema;  moves all extremities ?Integument: Skin feels warm ?Neuro:  At time of evaluation, alert and oriented to person/place/time/situation  ?Psych: Normal affect, patient feels well ? ? ?ASSESSMENT:   ? ?1. Mixed hyperlipidemia   ?2. Family history of premature CAD   ?3. Bilateral carotid artery stenosis   ? ?PLAN:   ? ?HLD ?FHX of Cad ?Mild CAS ?RBBB ?- LDL < 70 ?-BP at goal ?- Patient is asymptomatic with no bruit ?- no change in meds, continue ASA, zetia, and low dose rosuvastatin (and atorvastatin elevation in LFTs) ? ?One year follow up ?   ? ? ?Medication Adjustments/Labs and Tests Ordered: ?Current medicines are reviewed at length with the patient today.  Concerns regarding medicines are outlined above.  ?Orders Placed This Encounter  ?Procedures  ? EKG 12-Lead  ? ?No orders of the defined types were placed in this encounter. ? ? ?Patient Instructions  ?Medication Instructions:  ?Your physician recommends that you continue on your current medications as directed. Please refer to the Current Medication list given to you today. ? ?*If you need a refill on your cardiac medications before your next  appointment, please call your pharmacy* ? ? ?Lab Work: ?NONE ?If you have labs (blood work) drawn today and your tests are completely normal, you will receive your results only by: ?MyChart Message (if you have MyChart) OR ?A paper copy in the mail ?If you have any lab test that is abnormal or we need to change your treatment, we will call you to review the results. ? ? ?  Testing/Procedures: ?NONE ? ? ?Follow-Up: ?At [facility], you and your health needs are our priority.  As part of our continuing mission to provide you with exceptional heart care, we have created designated Provider Care Teams.  These Care Teams include your primary Cardiologist (physician) and Advanced Practice Providers (APPs -  Physician Assistants and Nurse Practitioners) who all work together to provide you with the care you need, when you need it. ? ? ?Your next appointment:   ?12 month(s) ? ?The format for your next appointment:   ?In Person ? ?Provider:   ?Werner Lean, MD   ?  ? ?Signed, ?Werner Lean, MD  ?05/28/2021 8:49 AM    ? [facility] ? ?

## 2021-05-28 ENCOUNTER — Ambulatory Visit (INDEPENDENT_AMBULATORY_CARE_PROVIDER_SITE_OTHER): Payer: Medicare Other | Admitting: Internal Medicine

## 2021-05-28 ENCOUNTER — Encounter: Payer: Self-pay | Admitting: Internal Medicine

## 2021-05-28 ENCOUNTER — Other Ambulatory Visit: Payer: Self-pay

## 2021-05-28 VITALS — BP 122/66 | HR 78 | Ht 62.0 in

## 2021-05-28 DIAGNOSIS — I6523 Occlusion and stenosis of bilateral carotid arteries: Secondary | ICD-10-CM | POA: Diagnosis not present

## 2021-05-28 DIAGNOSIS — Z8249 Family history of ischemic heart disease and other diseases of the circulatory system: Secondary | ICD-10-CM | POA: Diagnosis not present

## 2021-05-28 DIAGNOSIS — E782 Mixed hyperlipidemia: Secondary | ICD-10-CM | POA: Diagnosis not present

## 2021-05-28 NOTE — Patient Instructions (Signed)
Medication Instructions:  ?Your physician recommends that you continue on your current medications as directed. Please refer to the Current Medication list given to you today. ? ?*If you need a refill on your cardiac medications before your next appointment, please call your pharmacy* ? ? ?Lab Work: ?NONE ?If you have labs (blood work) drawn today and your tests are completely normal, you will receive your results only by: ?MyChart Message (if you have MyChart) OR ?A paper copy in the mail ?If you have any lab test that is abnormal or we need to change your treatment, we will call you to review the results. ? ? ?Testing/Procedures: ?NONE ? ? ?Follow-Up: ?At [facility], you and your health needs are our priority.  As part of our continuing mission to provide you with exceptional heart care, we have created designated Provider Care Teams.  These Care Teams include your primary Cardiologist (physician) and Advanced Practice Providers (APPs -  Physician Assistants and Nurse Practitioners) who all work together to provide you with the care you need, when you need it. ? ? ?Your next appointment:   ?12 month(s) ? ?The format for your next appointment:   ?In Person ? ?Provider:   ?Werner Lean, MD   ? ?

## 2021-06-01 DIAGNOSIS — Z20822 Contact with and (suspected) exposure to covid-19: Secondary | ICD-10-CM | POA: Diagnosis not present

## 2021-06-04 DIAGNOSIS — Z20822 Contact with and (suspected) exposure to covid-19: Secondary | ICD-10-CM | POA: Diagnosis not present

## 2021-06-16 ENCOUNTER — Encounter: Payer: Self-pay | Admitting: Internal Medicine

## 2021-06-25 DIAGNOSIS — Z20828 Contact with and (suspected) exposure to other viral communicable diseases: Secondary | ICD-10-CM | POA: Diagnosis not present

## 2021-06-25 DIAGNOSIS — Z20822 Contact with and (suspected) exposure to covid-19: Secondary | ICD-10-CM | POA: Diagnosis not present

## 2021-06-29 NOTE — Telephone Encounter (Signed)
Pt sent msg will need ov for UTI sxs. last saw MD 03/19/21.Marland KitchenJohny Murphy ?

## 2021-06-30 DIAGNOSIS — Z20822 Contact with and (suspected) exposure to covid-19: Secondary | ICD-10-CM | POA: Diagnosis not present

## 2021-07-09 DIAGNOSIS — Z20822 Contact with and (suspected) exposure to covid-19: Secondary | ICD-10-CM | POA: Diagnosis not present

## 2021-07-12 DIAGNOSIS — Z20822 Contact with and (suspected) exposure to covid-19: Secondary | ICD-10-CM | POA: Diagnosis not present

## 2021-07-16 DIAGNOSIS — Z20822 Contact with and (suspected) exposure to covid-19: Secondary | ICD-10-CM | POA: Diagnosis not present

## 2021-07-16 DIAGNOSIS — R051 Acute cough: Secondary | ICD-10-CM | POA: Diagnosis not present

## 2021-07-16 DIAGNOSIS — R059 Cough, unspecified: Secondary | ICD-10-CM | POA: Diagnosis not present

## 2021-07-18 DIAGNOSIS — Z20822 Contact with and (suspected) exposure to covid-19: Secondary | ICD-10-CM | POA: Diagnosis not present

## 2021-07-25 DIAGNOSIS — Z20822 Contact with and (suspected) exposure to covid-19: Secondary | ICD-10-CM | POA: Diagnosis not present

## 2021-07-27 ENCOUNTER — Other Ambulatory Visit: Payer: Self-pay | Admitting: Internal Medicine

## 2021-07-27 DIAGNOSIS — Z20822 Contact with and (suspected) exposure to covid-19: Secondary | ICD-10-CM | POA: Diagnosis not present

## 2021-08-07 ENCOUNTER — Other Ambulatory Visit: Payer: Self-pay | Admitting: Cardiovascular Disease

## 2021-08-11 ENCOUNTER — Ambulatory Visit: Payer: Medicare Other

## 2021-08-11 ENCOUNTER — Encounter: Payer: Self-pay | Admitting: Orthopedic Surgery

## 2021-08-11 ENCOUNTER — Ambulatory Visit (INDEPENDENT_AMBULATORY_CARE_PROVIDER_SITE_OTHER): Payer: Medicare Other | Admitting: Orthopedic Surgery

## 2021-08-11 VITALS — BP 104/70 | HR 80 | Ht 62.0 in | Wt 135.0 lb

## 2021-08-11 DIAGNOSIS — M25541 Pain in joints of right hand: Secondary | ICD-10-CM

## 2021-08-11 DIAGNOSIS — M1811 Unilateral primary osteoarthritis of first carpometacarpal joint, right hand: Secondary | ICD-10-CM | POA: Diagnosis not present

## 2021-08-11 MED ORDER — MELOXICAM 7.5 MG PO TABS: 7.5000 mg | ORAL_TABLET | Freq: Every day | ORAL | 0 refills | Status: DC
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
## 2021-08-15 ENCOUNTER — Other Ambulatory Visit: Payer: Self-pay | Admitting: Internal Medicine

## 2021-08-19 ENCOUNTER — Encounter: Payer: Self-pay | Admitting: Family Medicine

## 2021-08-19 ENCOUNTER — Ambulatory Visit (INDEPENDENT_AMBULATORY_CARE_PROVIDER_SITE_OTHER): Payer: Medicare Other

## 2021-08-19 ENCOUNTER — Other Ambulatory Visit: Payer: Self-pay | Admitting: Internal Medicine

## 2021-08-19 ENCOUNTER — Ambulatory Visit (INDEPENDENT_AMBULATORY_CARE_PROVIDER_SITE_OTHER): Payer: Medicare Other | Admitting: Family Medicine

## 2021-08-19 VITALS — BP 108/62 | HR 80 | Temp 97.6°F | Ht 62.0 in

## 2021-08-19 DIAGNOSIS — R052 Subacute cough: Secondary | ICD-10-CM

## 2021-08-19 DIAGNOSIS — R739 Hyperglycemia, unspecified: Secondary | ICD-10-CM

## 2021-08-19 DIAGNOSIS — R011 Cardiac murmur, unspecified: Secondary | ICD-10-CM | POA: Insufficient documentation

## 2021-08-19 DIAGNOSIS — I6523 Occlusion and stenosis of bilateral carotid arteries: Secondary | ICD-10-CM

## 2021-08-19 DIAGNOSIS — R059 Cough, unspecified: Secondary | ICD-10-CM | POA: Diagnosis not present

## 2021-08-19 DIAGNOSIS — E785 Hyperlipidemia, unspecified: Secondary | ICD-10-CM

## 2021-08-19 LAB — COMPREHENSIVE METABOLIC PANEL
ALT: 68 U/L — ABNORMAL HIGH (ref 0–35)
AST: 69 U/L — ABNORMAL HIGH (ref 0–37)
Albumin: 4.6 g/dL (ref 3.5–5.2)
Alkaline Phosphatase: 90 U/L (ref 39–117)
BUN: 29 mg/dL — ABNORMAL HIGH (ref 6–23)
CO2: 26 mEq/L (ref 19–32)
Calcium: 10.7 mg/dL — ABNORMAL HIGH (ref 8.4–10.5)
Chloride: 100 mEq/L (ref 96–112)
Creatinine, Ser: 0.89 mg/dL (ref 0.40–1.20)
GFR: 64.06 mL/min (ref >60.00)
Glucose, Bld: 109 mg/dL — ABNORMAL HIGH (ref 70–99)
Potassium: 4.4 mEq/L (ref 3.5–5.1)
Sodium: 139 mEq/L (ref 135–145)
Total Bilirubin: 0.5 mg/dL (ref 0.2–1.2)
Total Protein: 7.6 g/dL (ref 6.0–8.3)

## 2021-08-19 LAB — CBC WITH DIFFERENTIAL/PLATELET
Basophils Absolute: 0 10*3/uL (ref 0.0–0.1)
Basophils Relative: 0.8 % (ref 0.0–3.0)
Eosinophils Absolute: 0.3 10*3/uL (ref 0.0–0.7)
Eosinophils Relative: 5.2 % — ABNORMAL HIGH (ref 0.0–5.0)
HCT: 38.3 % (ref 36.0–46.0)
Hemoglobin: 12.6 g/dL (ref 12.0–15.0)
Lymphocytes Relative: 20.2 % (ref 12.0–46.0)
Lymphs Abs: 1.1 10*3/uL (ref 0.7–4.0)
MCHC: 33 g/dL (ref 30.0–36.0)
MCV: 86.6 fl (ref 78.0–100.0)
Monocytes Absolute: 0.4 10*3/uL (ref 0.1–1.0)
Monocytes Relative: 7.5 % (ref 3.0–12.0)
Neutro Abs: 3.7 10*3/uL (ref 1.4–7.7)
Neutrophils Relative %: 66.3 % (ref 43.0–77.0)
Platelets: 278 10*3/uL (ref 150.0–400.0)
RBC: 4.42 Mil/uL (ref 3.87–5.11)
RDW: 12.8 % (ref 11.5–15.5)
WBC: 5.5 10*3/uL (ref 4.0–10.5)

## 2021-08-19 LAB — C-REACTIVE PROTEIN: CRP: <1 mg/dL (ref 0.5–20.0)

## 2021-08-19 LAB — HEMOGLOBIN A1C: Hgb A1c MFr Bld: 6.8 % — ABNORMAL HIGH (ref 4.6–6.5)

## 2021-08-19 MED ORDER — BENZONATATE 200 MG PO CAPS: 200.0000 mg | ORAL_CAPSULE | Freq: Two times a day (BID) | ORAL | 0 refills | Status: DC | PRN

## 2021-08-19 NOTE — Patient Instructions (Signed)
Go downstairs for labs and chest X ray.   You will hear from [facility] to schedule an echocardiogram.   Tessalon prescribed for your cough.

## 2021-08-19 NOTE — Assessment & Plan Note (Signed)
Cough post viral. Other differentials include, new murmur, GERD, allergies. Albuterol does not help with cough. Chest XR and echo ordered. Tessalon prescribed.

## 2021-08-19 NOTE — Progress Notes (Signed)
Subjective:     Patient ID: Evelyn Murphy, female    DOB: 1946-03-19, 76 y.o.   MRN: [id number]  Chief Complaint  Patient presents with   Cough    Chronic dry cough for about 2 months    HPI Patient is in today for a 2 month hx of cough.  Cold 2 months ago and then Covid infection one month ago.   Asthma - using albuterol and it has not helped with cough.   Cough is worse with talking and laying down. Cough is keeping her awake.   Denies fever, chills, dizziness, chest pain, palpitations, shortness of breath, abdominal pain, N/V/D, urinary symptoms, LE edema.   Denies hx of murmur.   She teaches.      Health Maintenance Due  Topic Date Due   Hepatitis C Screening  Never done   MAMMOGRAM  04/22/2014   TETANUS/TDAP  10/11/2019   URINE MICROALBUMIN  03/05/2021    Past Medical History:  Diagnosis Date   Anxiety    on meds   Asthma    uses inhaler   Confusion 2016   admitted - ? TIA - NOT - was overmedication issue   Depression    on meds   Hyperlipidemia    on meds   Insomnia    RBBB    Seasonal allergies     Past Surgical History:  Procedure Laterality Date   ANKLE ARTHROSCOPY Left 12/18/2019   Procedure: LEFT ANKLE ARTHROSCOPY AND DEBRIDEMENT OF SUBTALAR JOINT DEBRIDEMENT ANKLE;  Surgeon: Evelyn Minion, MD;  Location: [place];  Service: Orthopedics;  Laterality: Left;   COLONOSCOPY  2011   CG-F/V-mov(exc)-normal - 10 yr   SHOULDER ARTHROSCOPY Right 10/21/2011   Dr Evelyn Murphy    Family History  Problem Relation Age of Onset   Heart disease Mother 61       CABG   Diabetes Mother    Macular degeneration Mother    Heart disease Father        Father died of MI at 2   COPD Father    Heart disease Brother    Diabetes Brother    Macular degeneration Brother    Colon polyps Neg Hx    Colon cancer Neg Hx    Esophageal cancer Neg Hx    Stomach cancer Neg Hx    Rectal cancer Neg Hx     Social History   Socioeconomic History    Marital status: Married    [name]   Number of children: 2   Years of education: Not on file   Highest education level: Not on file  Occupational History   Occupation: Retired  Tobacco Use   Smoking status: Never   Smokeless tobacco: Never  Vaping Use   Vaping Use: Never used  Substance and Sexual Activity   Alcohol use: No    Alcohol/week: 0.0 standard drinks   Drug use: No   Sexual activity: Yes  Other Topics Concern   Not on file  Social History Narrative   Married retired 2 daughters   Social Determinants of Radio broadcast assistant Strain: Not on file  Food Insecurity: Not on file  Transportation Needs: Not on file  Physical Activity: Not on file  Stress: Not on file  Social Connections: Not on file  Intimate Partner Violence: Not on file    Outpatient Medications Prior to Visit  Medication Sig Dispense Refill   aspirin EC 81 MG  tablet Take 1 tablet (81 mg total) by mouth daily. 30 tablet 0   citalopram (CELEXA) 40 MG tablet TAKE 1 TABLET BY MOUTH EVERY DAY 90 tablet 1   doxepin (SINEQUAN) 75 MG capsule TAKE 1 OR 2 CAPSULES AT BEDTIME 180 capsule 1   ezetimibe (ZETIA) 10 MG tablet TAKE 1 TABLET BY MOUTH EVERY DAY 90 tablet 2   meloxicam (MOBIC) 7.5 MG tablet Take 1 tablet (7.5 mg total) by mouth daily. 30 tablet 0   Multiple Vitamins-Minerals (MULTIVITAMIN WITH MINERALS) tablet Take 1 tablet by mouth daily.      rosuvastatin (CRESTOR) 10 MG tablet TAKE 1 TABLET BY MOUTH EVERY DAY 90 tablet 2   terbinafine (LAMISIL) 250 MG tablet      UNABLE TO FIND Med Name: Valerian Root 2 per day     No facility-administered medications prior to visit.    Allergies  Allergen Reactions   Lipitor [Atorvastatin]     elev LFTs    ROS Pertinent positives and negatives in the history of present illness.     Objective:    Physical Exam Constitutional:      General: She is not in acute distress.    Appearance: She is not ill-appearing.  HENT:      Mouth/Throat:     Mouth: Mucous membranes are moist.     Pharynx: Posterior oropharyngeal erythema present.  Eyes:     Conjunctiva/sclera: Conjunctivae normal.     Pupils: Pupils are equal, round, and reactive to light.  Cardiovascular:     Rate and Rhythm: Normal rate and regular rhythm.     Heart sounds: Murmur heard.  Pulmonary:     Effort: Pulmonary effort is normal.     Breath sounds: Normal breath sounds.  Musculoskeletal:     Cervical back: Normal range of motion and neck supple.     Right lower leg: No edema.     Left lower leg: No edema.  Lymphadenopathy:     Cervical: No cervical adenopathy.  Skin:    General: Skin is warm and dry.     Capillary Refill: Capillary refill takes less than 2 seconds.  Neurological:     General: No focal deficit present.     Mental Status: She is alert and oriented to person, place, and time.  Psychiatric:        Mood and Affect: Mood normal.        Behavior: Behavior normal.    BP 108/62 (BP Location: Left Arm, Patient Position: Sitting, Cuff Size: Large)   Pulse 80   Temp 97.6 F (36.4 C) (Temporal)   Ht '5\' 2"'$  (1.575 m)   SpO2 95%   BMI 24.69 kg/m  Wt Readings from Last 3 Encounters:  08/11/21 135 lb (61.2 kg)  03/19/21 135 lb (61.2 kg)  02/05/21 135 lb (61.2 kg)       Assessment & Plan:   Problem List Items Addressed This Visit       Other   Cough - Primary    Cough post viral. Other differentials include, new murmur, GERD, allergies. Albuterol does not help with cough. Chest XR and echo ordered. Tessalon prescribed.        Relevant Medications   benzonatate (TESSALON) 200 MG capsule   Other Relevant Orders   DG Chest 2 View   CBC with Differential/Platelet (Completed)   C-reactive protein (Completed)   Dyslipidemia   Newly recognized heart murmur    Chest XR and echo ordered. Refer back to  cardiologist pending results. Unclear if cough is related.        Relevant Orders   CBC with Differential/Platelet  (Completed)   C-reactive protein (Completed)   ECHOCARDIOGRAM COMPLETE    I am having Evelyn Murphy. Austill start on benzonatate. I am also having her maintain her aspirin EC, UNABLE TO FIND, multivitamin with minerals, terbinafine, citalopram, ezetimibe, rosuvastatin, meloxicam, and doxepin.  Meds ordered this encounter  Medications   benzonatate (TESSALON) 200 MG capsule    Sig: Take 1 capsule (200 mg total) by mouth 2 (two) times daily as needed for cough.    Dispense:  20 capsule    Refill:  0    Order Specific Question:   Supervising Provider    Answer:   Pricilla Holm A [[id number]]

## 2021-08-19 NOTE — Assessment & Plan Note (Signed)
Chest XR and echo ordered. Refer back to cardiologist pending results. Unclear if cough is related.

## 2021-08-20 ENCOUNTER — Encounter: Payer: Self-pay | Admitting: Family Medicine

## 2021-08-25 ENCOUNTER — Telehealth: Payer: Self-pay

## 2021-08-25 ENCOUNTER — Encounter: Payer: Self-pay | Admitting: Internal Medicine

## 2021-08-25 ENCOUNTER — Other Ambulatory Visit (INDEPENDENT_AMBULATORY_CARE_PROVIDER_SITE_OTHER): Payer: Medicare Other

## 2021-08-25 DIAGNOSIS — G8929 Other chronic pain: Secondary | ICD-10-CM

## 2021-08-25 DIAGNOSIS — M545 Low back pain, unspecified: Secondary | ICD-10-CM | POA: Diagnosis not present

## 2021-08-25 LAB — URINALYSIS, ROUTINE W REFLEX MICROSCOPIC

## 2021-08-25 NOTE — Telephone Encounter (Signed)
MD place order for UA. Notified can have done made lab appt for this afternoon.Marland KitchenJohny Murphy

## 2021-08-25 NOTE — Telephone Encounter (Signed)
OK UA Thx

## 2021-08-25 NOTE — Telephone Encounter (Signed)
Pt thinks she has UTI. Pt is requesting to drop off urine sample. Pt has appt on 08/27/21.

## 2021-08-26 ENCOUNTER — Other Ambulatory Visit: Payer: Self-pay | Admitting: Internal Medicine

## 2021-08-26 MED ORDER — CEPHALEXIN 500 MG PO CAPS: 500.0000 mg | ORAL_CAPSULE | Freq: Four times a day (QID) | ORAL | 0 refills | Status: DC

## 2021-08-27 ENCOUNTER — Ambulatory Visit (INDEPENDENT_AMBULATORY_CARE_PROVIDER_SITE_OTHER): Payer: Medicare Other | Admitting: Internal Medicine

## 2021-08-27 ENCOUNTER — Encounter: Payer: Self-pay | Admitting: Internal Medicine

## 2021-08-27 DIAGNOSIS — Z8616 Personal history of COVID-19: Secondary | ICD-10-CM | POA: Diagnosis not present

## 2021-08-27 DIAGNOSIS — M25551 Pain in right hip: Secondary | ICD-10-CM | POA: Diagnosis not present

## 2021-08-27 DIAGNOSIS — I6523 Occlusion and stenosis of bilateral carotid arteries: Secondary | ICD-10-CM | POA: Diagnosis not present

## 2021-08-27 DIAGNOSIS — R350 Frequency of micturition: Secondary | ICD-10-CM | POA: Diagnosis not present

## 2021-08-27 DIAGNOSIS — R945 Abnormal results of liver function studies: Secondary | ICD-10-CM | POA: Diagnosis not present

## 2021-08-27 DIAGNOSIS — M7061 Trochanteric bursitis, right hip: Secondary | ICD-10-CM | POA: Insufficient documentation

## 2021-08-27 NOTE — Assessment & Plan Note (Signed)
Post-COVID cough is resolving

## 2021-08-27 NOTE — Assessment & Plan Note (Signed)
UTI - abx was prescribed

## 2021-08-27 NOTE — Assessment & Plan Note (Signed)
New - Recent COVID and cruise: check liver US, repeat LFTs

## 2021-08-27 NOTE — Progress Notes (Signed)
Subjective:  Patient ID: Evelyn Murphy, female    DOB: [birth year]  Age: 76 y.o. MRN: [id number]  CC: No chief complaint on file.   HPI Evelyn Murphy presents for R thumb OA, recent COVID 85 - coughin is resolved, UTI C/o R hip pain - worse F/u on labs  Outpatient Medications Prior to Visit  Medication Sig Dispense Refill   aspirin EC 81 MG tablet Take 1 tablet (81 mg total) by mouth daily. 30 tablet 0   benzonatate (TESSALON) 200 MG capsule Take 1 capsule (200 mg total) by mouth 2 (two) times daily as needed for cough. 20 capsule 0   cephALEXin (KEFLEX) 500 MG capsule Take 1 capsule (500 mg total) by mouth 4 (four) times daily. 16 capsule 0   citalopram (CELEXA) 40 MG tablet TAKE 1 TABLET BY MOUTH EVERY DAY 90 tablet 1   doxepin (SINEQUAN) 75 MG capsule TAKE 1 OR 2 CAPSULES AT BEDTIME 180 capsule 1   ezetimibe (ZETIA) 10 MG tablet TAKE 1 TABLET BY MOUTH EVERY DAY 90 tablet 2   meloxicam (MOBIC) 7.5 MG tablet Take 1 tablet (7.5 mg total) by mouth daily. 30 tablet 0   Multiple Vitamins-Minerals (MULTIVITAMIN WITH MINERALS) tablet Take 1 tablet by mouth daily.      rosuvastatin (CRESTOR) 10 MG tablet TAKE 1 TABLET BY MOUTH EVERY DAY 90 tablet 2   terbinafine (LAMISIL) 250 MG tablet      UNABLE TO FIND Med Name: Valerian Root 2 per day     No facility-administered medications prior to visit.    ROS: Review of Systems  Constitutional:  Negative for activity change, appetite change, chills, fatigue and unexpected weight change.  HENT:  Negative for congestion, mouth sores and sinus pressure.   Eyes:  Negative for visual disturbance.  Respiratory:  Negative for cough and chest tightness.   Gastrointestinal:  Negative for abdominal pain and nausea.  Genitourinary:  Negative for difficulty urinating, frequency and vaginal pain.  Musculoskeletal:  Positive for arthralgias. Negative for back pain and gait problem.  Skin:  Negative for pallor and rash.  Neurological:  Negative for  dizziness, tremors, weakness, numbness and headaches.  Psychiatric/Behavioral:  Negative for confusion and sleep disturbance.     Objective:  BP 110/60 (BP Location: Left Arm, Patient Position: Sitting, Cuff Size: Large)   Pulse 79   Temp 98.1 F (36.7 C) (Oral)   Ht '5\' 2"'$  (1.575 m)   SpO2 90%   BMI 24.69 kg/m   BP Readings from Last 3 Encounters:  08/27/21 110/60  08/19/21 108/62  08/11/21 104/70    Wt Readings from Last 3 Encounters:  08/11/21 135 lb (61.2 kg)  03/19/21 135 lb (61.2 kg)  02/05/21 135 lb (61.2 kg)    Physical Exam Constitutional:      General: She is not in acute distress.    Appearance: She is well-developed.  HENT:     Head: Normocephalic.     Right Ear: External ear normal.     Left Ear: External ear normal.     Nose: Nose normal.  Eyes:     General:        Right eye: No discharge.        Left eye: No discharge.     Conjunctiva/sclera: Conjunctivae normal.     Pupils: Pupils are equal, round, and reactive to light.  Neck:     Thyroid: No thyromegaly.     Vascular: No JVD.     Trachea:  No tracheal deviation.  Cardiovascular:     Rate and Rhythm: Normal rate and regular rhythm.     Heart sounds: Normal heart sounds.  Pulmonary:     Effort: No respiratory distress.     Breath sounds: No stridor. No wheezing.  Abdominal:     General: Bowel sounds are normal. There is no distension.     Palpations: Abdomen is soft. There is no mass.     Tenderness: There is no abdominal tenderness. There is no guarding or rebound.  Musculoskeletal:        General: No tenderness.     Cervical back: Normal range of motion and neck supple. No rigidity.  Lymphadenopathy:     Cervical: No cervical adenopathy.  Skin:    Findings: No erythema or rash.  Neurological:     Cranial Nerves: No cranial nerve deficit.     Motor: No abnormal muscle tone.     Coordination: Coordination normal.     Deep Tendon Reflexes: Reflexes normal.  Psychiatric:        Behavior:  Behavior normal.        Thought Content: Thought content normal.        Judgment: Judgment normal.   R lat hip w/pain on palpation Full ROM    Procedure Note :     Procedure : Joint Injection,   R hip   Indication:  Trochanteric bursitis with refractory  acute/chronic pain.   Risks including unsuccessful procedure , bleeding, infection, bruising, skin atrophy, "steroid flare-up" and others were explained to the patient in detail as well as the benefits. Informed consent was obtained verbally.  Tthe patient was placed in a comfortable lateral decubitus position. The point of maximal tenderness was identified. Skin was prepped with Betadine and alcohol. Then, a 5 cc syringe with a 2 inch long 24-gauge needle was used for a bursa injection.. The needle was advanced  Into the bursa. I injected the bursa with 4 mL of 2% lidocaine and 40 mg of Depo-Medrol .  Band-Aid was applied.   Tolerated well. Complications: None. Good pain relief following the procedure.   Postprocedure instructions :    A Band-Aid should be left on for 12 hours. Injection therapy is not a cure itself. It is used in conjunction with other modalities. You can use nonsteroidal anti-inflammatories like ibuprofen , hot and cold compresses. Rest is recommended in the next 24 hours. You need to report immediately  if fever, chills or any signs of infection develop.  Lab Results  Component Value Date   WBC 5.5 08/19/2021   HGB 12.6 08/19/2021   HCT 38.3 08/19/2021   PLT 278.0 08/19/2021   GLUCOSE 109 (H) 08/19/2021   CHOL 150 03/18/2021   TRIG 81.0 03/18/2021   HDL 66.40 03/18/2021   LDLDIRECT 135.5 01/24/2013   LDLCALC 67 03/18/2021   ALT 68 (H) 08/19/2021   AST 69 (H) 08/19/2021   NA 139 08/19/2021   K 4.4 08/19/2021   CL 100 08/19/2021   CREATININE 0.89 08/19/2021   BUN 29 (H) 08/19/2021   CO2 26 08/19/2021   TSH 2.86 03/18/2021   INR 0.96 06/29/2014   HGBA1C 6.8 (H) 08/19/2021   MICROALBUR <0.7 03/05/2020     No results found.  Assessment & Plan:   Problem List Items Addressed This Visit     ABNORMAL LIVER FUNCTION TESTS    New - Recent COVID and cruise: check liver US, repeat LFTs      Relevant Orders  Comprehensive metabolic panel   US Abdomen Limited RUQ (LIVER/GB)   Hip pain, acute, right    Hip opener exercises Blue-Emu cream was recommended to use 2-3 times a day      History of COVID-19    Post-COVID cough is resolving      Trochanteric bursitis of right hip    Hip opener exercises Blue-Emu cream was recommended to use 2-3 times a day Options discussed - will inject      Urinary frequency    UTI - abx was prescribed         No orders of the defined types were placed in this encounter.     Follow-up: Return in about 6 months (around 02/26/2022) for Wellness Exam.  Walker Kehr, MD

## 2021-08-27 NOTE — Assessment & Plan Note (Signed)
Hip opener exercises Blue-Emu cream was recommended to use 2-3 times a day Options discussed - will inject

## 2021-08-27 NOTE — Patient Instructions (Addendum)
Vegetable laxative Senakot-S  Hip opener exercises Blue-Emu cream -- use 2-3 times a day     Postprocedure instructions :    A Band-Aid should be left on for 12 hours. Injection therapy is not a cure itself. It is used in conjunction with other modalities. You can use nonsteroidal anti-inflammatories like ibuprofen , hot and cold compresses. Rest is recommended in the next 24 hours. You need to report immediately  if fever, chills or any signs of infection develop.

## 2021-08-27 NOTE — Assessment & Plan Note (Signed)
Hip opener exercises Blue-Emu cream was recommended to use 2-3 times a day  

## 2021-08-31 ENCOUNTER — Telehealth: Payer: Self-pay

## 2021-08-31 NOTE — Telephone Encounter (Signed)
Pt is calling to report the injection given last week has given any relief.  Pt states that she hasn't exercised since that appt. Pt states she may have walked a mile since her appt.  Pt advise  CB [phone]

## 2021-09-01 NOTE — Telephone Encounter (Signed)
Noted../lmb 

## 2021-09-01 NOTE — Telephone Encounter (Signed)
I am sorry.  It may still be working.  Use heat, ice, massage.  Look up hip opener exercises.  Okay to start walking.  Use blue emu oil 4 times a day.  Take Aleve 1 twice a day.  Thanks

## 2021-09-01 NOTE — Telephone Encounter (Signed)
Pt called in for update on the call for yesterday.  I advised the pt of Dr. Alain Marion recommendations stating It may still be working.  Use heat, ice, massage.  Look up hip opener exercises.  Okay to start walking.  Use blue emu oil 4 times a day.  Take Aleve 1 twice a day.   FYI

## 2021-09-07 ENCOUNTER — Other Ambulatory Visit: Payer: Self-pay | Admitting: Orthopedic Surgery

## 2021-09-07 ENCOUNTER — Ambulatory Visit (HOSPITAL_COMMUNITY)
Admission: RE | Admit: 2021-09-07 | Discharge: 2021-09-07 | Disposition: A | Discharge status: Home / Self Care | Payer: Medicare Other | Source: Ambulatory Visit | Attending: Family Medicine | Admitting: Family Medicine

## 2021-09-07 DIAGNOSIS — I081 Rheumatic disorders of both mitral and tricuspid valves: Secondary | ICD-10-CM | POA: Diagnosis not present

## 2021-09-07 DIAGNOSIS — E785 Hyperlipidemia, unspecified: Secondary | ICD-10-CM | POA: Insufficient documentation

## 2021-09-07 DIAGNOSIS — I4510 Unspecified right bundle-branch block: Secondary | ICD-10-CM | POA: Diagnosis not present

## 2021-09-07 DIAGNOSIS — R011 Cardiac murmur, unspecified: Secondary | ICD-10-CM

## 2021-09-07 DIAGNOSIS — R0602 Shortness of breath: Secondary | ICD-10-CM | POA: Insufficient documentation

## 2021-09-07 DIAGNOSIS — R059 Cough, unspecified: Secondary | ICD-10-CM | POA: Insufficient documentation

## 2021-09-07 LAB — ECHOCARDIOGRAM COMPLETE
Area-P 1/2: 2.73 cm2
S' Lateral: 2.3 cm

## 2021-09-07 NOTE — Progress Notes (Signed)
Please call her and let her know that there are abnormalities on her echocardiogram that will need to be addressed by her cardiologist and she will most likely need additional testing per the echo results. Is she comfortable calling and scheduling with her cardiologist or we can assist her with this if she is not. Diagnosis is new cardiac murmur and abnormal echocardiogram. Thanks.

## 2021-09-08 ENCOUNTER — Encounter: Payer: Self-pay | Admitting: Internal Medicine

## 2021-09-09 ENCOUNTER — Other Ambulatory Visit: Payer: Self-pay | Admitting: Internal Medicine

## 2021-09-09 DIAGNOSIS — M25559 Pain in unspecified hip: Secondary | ICD-10-CM

## 2021-09-17 ENCOUNTER — Ambulatory Visit (INDEPENDENT_AMBULATORY_CARE_PROVIDER_SITE_OTHER): Payer: Medicare Other | Admitting: Orthopaedic Surgery

## 2021-09-17 ENCOUNTER — Ambulatory Visit (INDEPENDENT_AMBULATORY_CARE_PROVIDER_SITE_OTHER): Payer: Medicare Other

## 2021-09-17 ENCOUNTER — Encounter: Payer: Self-pay | Admitting: Orthopaedic Surgery

## 2021-09-17 DIAGNOSIS — I6523 Occlusion and stenosis of bilateral carotid arteries: Secondary | ICD-10-CM | POA: Diagnosis not present

## 2021-09-17 DIAGNOSIS — Z01419 Encounter for gynecological examination (general) (routine) without abnormal findings: Secondary | ICD-10-CM | POA: Diagnosis not present

## 2021-09-17 DIAGNOSIS — M25551 Pain in right hip: Secondary | ICD-10-CM

## 2021-09-17 DIAGNOSIS — Z1231 Encounter for screening mammogram for malignant neoplasm of breast: Secondary | ICD-10-CM | POA: Diagnosis not present

## 2021-09-17 MED ORDER — DICLOFENAC SODIUM 75 MG PO TBEC: 75.0000 mg | DELAYED_RELEASE_TABLET | Freq: Two times a day (BID) | ORAL | 2 refills | Status: DC
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
## 2021-09-24 ENCOUNTER — Other Ambulatory Visit: Payer: Medicare Other

## 2021-09-28 ENCOUNTER — Ambulatory Visit
Admission: RE | Admit: 2021-09-28 | Discharge: 2021-09-28 | Disposition: A | Discharge status: Home / Self Care | Payer: Medicare Other | Source: Ambulatory Visit | Attending: Internal Medicine | Admitting: Internal Medicine

## 2021-09-28 DIAGNOSIS — R945 Abnormal results of liver function studies: Secondary | ICD-10-CM

## 2021-10-01 ENCOUNTER — Ambulatory Visit (INDEPENDENT_AMBULATORY_CARE_PROVIDER_SITE_OTHER): Payer: Medicare Other | Admitting: Family Medicine

## 2021-10-01 ENCOUNTER — Ambulatory Visit: Payer: Self-pay

## 2021-10-01 ENCOUNTER — Encounter: Payer: Self-pay | Admitting: Family Medicine

## 2021-10-01 VITALS — BP 92/60 | HR 78 | Ht 62.0 in | Wt 135.0 lb

## 2021-10-01 DIAGNOSIS — M25551 Pain in right hip: Secondary | ICD-10-CM

## 2021-10-01 DIAGNOSIS — M7061 Trochanteric bursitis, right hip: Secondary | ICD-10-CM | POA: Diagnosis not present

## 2021-10-01 DIAGNOSIS — I6523 Occlusion and stenosis of bilateral carotid arteries: Secondary | ICD-10-CM | POA: Diagnosis not present

## 2021-10-01 NOTE — Progress Notes (Signed)
   I, Wendy Poet, LAT, ATC, am serving as scribe for Dr. Lynne Leader.  Subjective:    CC: R hip pain  HPI: Pt is a 76 y/o female c/o R hip pain x 3 months w/ no known MOI . Pt was seen by Dr. Erlinda Hong on 09/17/21 and R hip pain was thought to be due to bursitis and ABD tendinosis and was prescribed Diclofenac. Pt locates pain to her R lateral hip.  Radiating pain: no Aggravating factors: laying on her R side; increased pain later in the day Treatments tried: oral Diclofenac; sleeping w/ pillow b/w knees; exercises she found on-line  Dx imaging: 09/17/21 R hip XR  Pertinent review of Systems: No fevers or chills  Relevant historical information: Carotid artery disease.   Objective:    Vitals:   10/01/21 1239  BP: 92/60  Pulse: 78  SpO2: 94%   General: Well Developed, well nourished, and in no acute distress.   MSK: Right hip: Normal-appearing Tender palpation at greater trochanter. Normal hip motion. Hip abduction and external rotation strength are diminished 4/5.  Lab and Radiology Results  Procedure: Real-time Ultrasound Guided Injection of right hip greater trochanter bursa Device: Philips Affiniti 50G Images permanently stored and available for review in PACS Verbal informed consent obtained.  Discussed risks and benefits of procedure. Warned about infection, bleeding, hyperglycemia damage to structures among others. Patient expresses understanding and agreement Time-out conducted.   Noted no overlying erythema, induration, or other signs of local infection.   Skin prepped in a sterile fashion.   Local anesthesia: Topical Ethyl chloride.   With sterile technique and under real time ultrasound guidance: 40 mg of Kenalog and 2 mL of Marcaine injected into greater trochanter bursa. Fluid seen entering the bursa.   Completed without difficulty   Pain moderately resolved suggesting accurate placement of the medication.   Advised to call if fevers/chills, erythema,  induration, drainage, or persistent bleeding.   Images permanently stored and available for review in the ultrasound unit.  Impression: Technically successful ultrasound guided injection.       Impression and Recommendations:    Assessment and Plan: 76 y.o. female with Right lateral hip pain due to bursitis plan for ultrasound-guided injection as above today.  Also recommend physical therapy.  She has established relationship with [facility] [place] [facility] office.  Will refer to that location.  Check back as needed.  If not improved would consider MRI.Marland Kitchen  PDMP not reviewed this encounter. Orders Placed This Encounter  Procedures   [place] LIMITED JOINT SPACE STRUCTURES LOW RIGHT(NO LINKED CHARGES)    Order Specific Question:   Reason for Exam (SYMPTOM  OR DIAGNOSIS REQUIRED)    Answer:   R hip pain    Order Specific Question:   Preferred imaging location?    Answer:   [place]   Ambulatory referral to Physical Therapy    Referral Priority:   Routine    Referral Type:   Physical Medicine    Referral Reason:   Specialty Services Required    Requested Specialty:   Physical Therapy    Number of Visits Requested:   1   No orders of the defined types were placed in this encounter.   Discussed warning signs or symptoms. Please see discharge instructions. Patient expresses understanding.   The above documentation has been reviewed and is accurate and complete Lynne Leader, M.D.

## 2021-10-01 NOTE — Patient Instructions (Addendum)
Nice to meet you today.  You had a R hip (GT) injection.  Call or go to the ER if you develop a large red swollen joint with extreme pain or oozing puss.   I've referred you to Physical Therapy.  Their office will call you to schedule but please let us know if you don't hear from them in one week regarding scheduling.  Follow-up: as needed

## 2021-10-08 ENCOUNTER — Other Ambulatory Visit (INDEPENDENT_AMBULATORY_CARE_PROVIDER_SITE_OTHER): Payer: Medicare Other

## 2021-10-08 DIAGNOSIS — R945 Abnormal results of liver function studies: Secondary | ICD-10-CM

## 2021-10-08 LAB — COMPREHENSIVE METABOLIC PANEL
ALT: 36 U/L — ABNORMAL HIGH (ref 0–35)
AST: 20 U/L (ref 0–37)
Albumin: 4.7 g/dL (ref 3.5–5.2)
Alkaline Phosphatase: 71 U/L (ref 39–117)
BUN: 26 mg/dL — ABNORMAL HIGH (ref 6–23)
CO2: 28 mEq/L (ref 19–32)
Calcium: 10.1 mg/dL (ref 8.4–10.5)
Chloride: 104 mEq/L (ref 96–112)
Creatinine, Ser: 0.86 mg/dL (ref 0.40–1.20)
GFR: 66.68 mL/min (ref >60.00)
Glucose, Bld: 125 mg/dL — ABNORMAL HIGH (ref 70–99)
Potassium: 4.2 mEq/L (ref 3.5–5.1)
Sodium: 141 mEq/L (ref 135–145)
Total Bilirubin: 0.5 mg/dL (ref 0.2–1.2)
Total Protein: 7.4 g/dL (ref 6.0–8.3)

## 2021-10-15 ENCOUNTER — Encounter: Payer: Self-pay | Admitting: Physical Therapy

## 2021-10-15 ENCOUNTER — Ambulatory Visit (INDEPENDENT_AMBULATORY_CARE_PROVIDER_SITE_OTHER): Payer: Medicare Other | Admitting: Physical Therapy

## 2021-10-15 DIAGNOSIS — M25551 Pain in right hip: Secondary | ICD-10-CM | POA: Diagnosis not present
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
## 2021-10-23 ENCOUNTER — Encounter: Payer: Medicare Other | Admitting: Physical Therapy

## 2021-10-23 ENCOUNTER — Encounter: Payer: Self-pay | Admitting: Internal Medicine

## 2021-10-23 ENCOUNTER — Ambulatory Visit (INDEPENDENT_AMBULATORY_CARE_PROVIDER_SITE_OTHER): Payer: Medicare Other | Admitting: Internal Medicine

## 2021-10-23 VITALS — BP 117/76 | HR 80 | Ht 62.0 in | Wt 135.0 lb

## 2021-10-23 DIAGNOSIS — I4510 Unspecified right bundle-branch block: Secondary | ICD-10-CM | POA: Diagnosis not present

## 2021-10-23 DIAGNOSIS — I779 Disorder of arteries and arterioles, unspecified: Secondary | ICD-10-CM

## 2021-10-23 DIAGNOSIS — I422 Other hypertrophic cardiomyopathy: Secondary | ICD-10-CM

## 2021-10-23 DIAGNOSIS — E7800 Pure hypercholesterolemia, unspecified: Secondary | ICD-10-CM | POA: Diagnosis not present

## 2021-10-23 NOTE — Progress Notes (Signed)
Cardiology Office Note:    Date:  10/23/2021   ID:  Evelyn Murphy, DOB [birth year], MRN [id number]  PCP:  Cassandria Anger, MD   [facility] Providers Cardiologist:  Werner Lean, MD     Referring MD: Cassandria Anger, MD   CC: One year follow up  History of Present Illness:    Evelyn Murphy is a 76 y.o. female with a FHX of CAD, [place], Mild CAS, Asthma, [place] 2021. 2023: Went to [place] cruise; then got Covid-19.  Has persistent cough.  As part of the found to have a mild LVOT obstruction with a hyperdynamic LV.  Patient notes that she is doing well.   Since last visit notes working three times a week- stationary back hour a day, water aerobics 3X week with no issues . There are no interval hospital/ED visit.    No chest pain or pressure.  No SOB/DOE only with running up a flight of steps and no PND/Orthopnea.  No weight gain or leg swelling.  No palpitations or syncope .   Past Medical History:  Diagnosis Date   Anxiety    on meds   Asthma    uses inhaler   Confusion 2016   admitted - ? TIA - NOT - was overmedication issue   Depression    on meds   Hyperlipidemia    on meds   Insomnia    RBBB    Seasonal allergies     Past Surgical History:  Procedure Laterality Date   ANKLE ARTHROSCOPY Left 12/18/2019   Procedure: LEFT ANKLE ARTHROSCOPY AND DEBRIDEMENT OF SUBTALAR JOINT DEBRIDEMENT ANKLE;  Surgeon: Newt Minion, MD;  Location: [place];  Service: Orthopedics;  Laterality: Left;   COLONOSCOPY  2011   CG-F/V-mov(exc)-normal - 10 yr   SHOULDER ARTHROSCOPY Right 10/21/2011   Dr Meyer Cory    Current Medications: Current Meds  Medication Sig   aspirin EC 81 MG tablet Take 1 tablet (81 mg total) by mouth daily.   citalopram (CELEXA) 40 MG tablet TAKE 1 TABLET BY MOUTH EVERY DAY   doxepin (SINEQUAN) 75 MG capsule TAKE 1 OR 2 CAPSULES AT BEDTIME   ezetimibe (ZETIA) 10 MG tablet TAKE 1 TABLET BY MOUTH EVERY DAY   Multiple  Vitamins-Minerals (MULTIVITAMIN WITH MINERALS) tablet Take 1 tablet by mouth daily.    rosuvastatin (CRESTOR) 10 MG tablet TAKE 1 TABLET BY MOUTH EVERY DAY   UNABLE TO FIND Med Name: Valerian Root 2 per day     Allergies:   Lipitor [atorvastatin]   Social History   Socioeconomic History   Marital status: Married    [name]   Number of children: 2   Years of education: Not on file   Highest education level: Not on file  Occupational History   Occupation: Retired  Tobacco Use   Smoking status: Never   Smokeless tobacco: Never  Vaping Use   Vaping Use: Never used  Substance and Sexual Activity   Alcohol use: No    Alcohol/week: 0.0 standard drinks of alcohol   Drug use: No   Sexual activity: Yes  Other Topics Concern   Not on file  Social History Narrative   Married retired 2 daughters   Social Determinants of Health   Financial Resource Strain: [place]  (05/25/2017)   Overall Financial Resource Strain ([place])    Difficulty of Paying Living Expenses: Not hard at [place]: No [place] (05/25/2017)  Hunger Vital Sign    Worried About Running Out of Food in the Last Year: Never true    Ran Out of Food in the Last Year: Never true  Transportation Needs: No Transportation Needs (05/25/2017)   PRAPARE - Hydrologist (Medical): No    Lack of Transportation (Non-Medical): No  Physical Activity: Sufficiently Active (05/25/2017)   Exercise Vital Sign    Days of Exercise per Week: 5 days    Minutes of Exercise per Session: 60 min  Stress: No Stress Concern Present (05/25/2017)   [place]    Feeling of Stress : Only a little  Social Connections: Socially Integrated (05/25/2017)   Social Connection and Isolation Panel [NHANES]    Frequency of Communication with Friends and Family: More than three times a week    Frequency of Social Gatherings with Friends  and Family: More than three times a week    Attends Religious Services: More than 4 times per year    Active Member of [employer] or Organizations: Yes    Attends Music therapist: More than 4 times per year    Marital Status: Married    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 There is no history of Colon polyps, Colon cancer, Esophageal cancer, Stomach cancer, or Rectal cancer.  ROS:   Please see the history of present illness.     All other systems reviewed and are negative.  EKGs/Labs/Other Studies Reviewed:    The following studies were reviewed today:  EKG:  EKG is  ordered today.  The ekg ordered today demonstrates  05/28/21: SR rate 78 RBBB  Recent Labs: 03/18/2021: TSH 2.86 08/19/2021: Hemoglobin 12.6; Platelets 278.0 10/08/2021: ALT 36; BUN 26; Creatinine, Ser 0.86; Potassium 4.2; Sodium 141  Recent Lipid Panel    Component Value Date/Time   CHOL 150 03/18/2021 1313   CHOL 150 08/30/2018 0959   CHOL 181 03/18/2014 1437   TRIG 81.0 03/18/2021 1313   TRIG 160 (H) 03/18/2014 1437   HDL 66.40 03/18/2021 1313   HDL 75 08/30/2018 0959   HDL 67 03/18/2014 1437   CHOLHDL 2 03/18/2021 1313   VLDL 16.2 03/18/2021 1313   LDLCALC 67 03/18/2021 1313   LDLCALC 60 08/30/2018 0959   LDLCALC 82 03/18/2014 1437   LDLDIRECT 135.5 01/24/2013 1404       Physical Exam:    VS:  BP 117/76   Pulse 80   Ht '5\' 2"'$  (1.575 m)   Wt 135 lb (61.2 kg)   SpO2 95%   BMI 24.69 kg/m     Wt Readings from Last 3 Encounters:  10/23/21 135 lb (61.2 kg)  10/01/21 135 lb (61.2 kg)  08/11/21 135 lb (61.2 kg)    Gen: no distress   Neck: No JVD, no carotid bruit Ears: no Pilar Plate Sign Cardiac: No Rubs or Gallops, no murmur at rest  but harsh systolic murmur with standing, regular rhythm and +2 radial pulses Respiratory: Clear to auscultation bilaterally, normal effort, normal  respiratory rate GI: Soft, nontender, non-distended  MS: No  edema;  moves all extremities Integument: Skin feels warm Neuro:  At time of evaluation, alert and oriented to person/place/time/situation  Psych: Normal affect, patient feels well   ASSESSMENT:    1. Hypertrophic cardiomyopathy ([place])   2. Hypercholesterolemia   3. Bilateral carotid artery disease, unspecified type ([place])   4. RBBB     PLAN:    Dynamic LVOT Gradient - 17 mm HG with mild SAM related MR (mild LVOT gradient, mild MR) - with no symptoms or history of HCM - we will get CMR, if no evidence of HCM will likely treat conservatively - if no SOB, DOE, or dizziness, we will start metoprolol succinate 25 mg and attempt stress echo for inducible gradient - discussed hydration - no activity barriers presently  HLD Mild CAS FHX of Cad RBBB - LDL < 70 -BP at goal - Patient is asymptomatic with no bruit - no change in meds, continue ASA, zetia, and low dose rosuvastatin (and atorvastatin elevation in LFTs)  Time Spent Directly with Patient:   I have spent a total of 40 minutes with the patient reviewing notes, imaging, EKGs, labs and examining the patient as well as establishing an assessment and plan that was discussed personally with the patient.  > 50% of time was spent in direct patient care and family and reviewing imaging with patient.      Medication Adjustments/Labs and Tests Ordered: Current medicines are reviewed at length with the patient today.  Concerns regarding medicines are outlined above.  Orders Placed This Encounter  Procedures   MR CARDIAC MORPHOLOGY W WO CONTRAST   No orders of the defined types were placed in this encounter.   Patient Instructions  Medication Instructions:  Your physician recommends that you continue on your current  medications as directed. Please refer to the Current Medication list given to you today.  *If you need a refill on your cardiac medications before your next appointment, please call your pharmacy*   Lab Work: NONE If you have labs (blood work) drawn today and your tests are completely normal, you will receive your results only by: [place] (if you have MyChart) OR A paper copy in the mail If you have any lab test that is abnormal or we need to change your treatment, we will call you to review the results.   Testing/Procedures: Your physician has requested that you have a cardiac MRI. Cardiac MRI uses a computer to create images of your heart as its beating, producing both still and moving pictures of your heart and major blood vessels. For further information please visit [email or web address] Please follow the instruction sheet given to you today for more information.    Follow-Up: At [facility], you and your health needs are our priority.  As part of our continuing mission to provide you with exceptional heart care, we have created designated Provider Care Teams.  These Care Teams include your primary Cardiologist (physician) and Advanced Practice Providers (APPs -  Physician Assistants and Nurse Practitioners) who all work together to provide you with the care you need, when you need it.     Your next appointment:   1 year(s)  The format for your next appointment:   In Person  Provider:   Werner Lean, MD     Other Instructions   You are scheduled for Cardiac MRI on ______________. Please arrive for your appointment at ______________ ( arrive 30-45 minutes prior to test start time). ?  [facility] [address] [place], [place] [address] [phone] Please take advantage of the free valet parking available at the MAIN entrance (A entrance).  Proceed to the [facility] Radiology Department (First Floor) for check-in.   [facility] [place], [place] [address] [phone] Please take advantage of the free valet parking available at the MAIN entrance. Proceed to [facility] registration for check-in (first floor).  Magnetic resonance imaging (MRI) is a painless test that produces images of the inside of the body without using Xrays.  During an MRI, strong magnets and radio waves work together in a Research officer, political party to form detailed images.   MRI images may provide more details about a medical condition than X-rays, CT scans, and ultrasounds can provide.  You may be given earphones to listen for instructions.  You may eat a light breakfast and take medications as ordered with the exception of HCTZ (fluid pill, other). Please avoid stimulants for 12 hr prior to test. (Ie. Caffeine, nicotine, chocolate, or antihistamine medications)  If a contrast material will be used, an IV will be inserted into one of your veins. Contrast material will be injected into your IV. It will leave your body through your urine within a day. You may be told to drink plenty of fluids to help flush the contrast material out of your system.  You will be asked to remove all metal, including: Watch, jewelry, and other metal objects including hearing aids, hair pieces and dentures. Also wearable glucose monitoring systems (ie. Freestyle Libre and Omnipods) (Braces and fillings normally are not a problem.)   TEST WILL TAKE APPROXIMATELY 1 HOUR  PLEASE NOTIFY SCHEDULING AT LEAST 24 HOURS IN ADVANCE IF YOU ARE UNABLE TO KEEP YOUR APPOINTMENT. [phone]  Please call Marchia Bond, cardiac imaging nurse navigator with any questions/concerns. Marchia Bond RN Navigator Cardiac Imaging Gordy Clement RN Navigator Cardiac Imaging [facility] Vascular Services [phone] Office  ]  Important Information About Sugar         Signed, Werner Lean, MD  10/23/2021 2:13 PM    [facility]

## 2021-10-23 NOTE — Patient Instructions (Signed)
Medication Instructions:  Your physician recommends that you continue on your current medications as directed. Please refer to the Current Medication list given to you today.  *If you need a refill on your cardiac medications before your next appointment, please call your pharmacy*   Lab Work: NONE If you have labs (blood work) drawn today and your tests are completely normal, you will receive your results only by: [place] (if you have MyChart) OR A paper copy in the mail If you have any lab test that is abnormal or we need to change your treatment, we will call you to review the results.   Testing/Procedures: Your physician has requested that you have a cardiac MRI. Cardiac MRI uses a computer to create images of your heart as its beating, producing both still and moving pictures of your heart and major blood vessels. For further information please visit [email or web address] Please follow the instruction sheet given to you today for more information.    Follow-Up: At [facility], you and your health needs are our priority.  As part of our continuing mission to provide you with exceptional heart care, we have created designated Provider Care Teams.  These Care Teams include your primary Cardiologist (physician) and Advanced Practice Providers (APPs -  Physician Assistants and Nurse Practitioners) who all work together to provide you with the care you need, when you need it.     Your next appointment:   1 year(s)  The format for your next appointment:   In Person  Provider:   Werner Lean, MD     Other Instructions   You are scheduled for Cardiac MRI on ______________. Please arrive for your appointment at ______________ ( arrive 30-45 minutes prior to test start time). ?  [facility] [address] [place], [place] [address] [phone] Please take advantage of the free valet parking available at the MAIN entrance (A entrance).  Proceed to  the [facility] Radiology Department (First Floor) for check-in.   [facility] [place], [place] [address] [phone] Please take advantage of the free valet parking available at the MAIN entrance. Proceed to [facility] registration for check-in (first floor).  Magnetic resonance imaging (MRI) is a painless test that produces images of the inside of the body without using Xrays.  During an MRI, strong magnets and radio waves work together in a Research officer, political party to form detailed images.   MRI images may provide more details about a medical condition than X-rays, CT scans, and ultrasounds can provide.  You may be given earphones to listen for instructions.  You may eat a light breakfast and take medications as ordered with the exception of HCTZ (fluid pill, other). Please avoid stimulants for 12 hr prior to test. (Ie. Caffeine, nicotine, chocolate, or antihistamine medications)  If a contrast material will be used, an IV will be inserted into one of your veins. Contrast material will be injected into your IV. It will leave your body through your urine within a day. You may be told to drink plenty of fluids to help flush the contrast material out of your system.  You will be asked to remove all metal, including: Watch, jewelry, and other metal objects including hearing aids, hair pieces and dentures. Also wearable glucose monitoring systems (ie. Freestyle Libre and Omnipods) (Braces and fillings normally are not a problem.)   TEST WILL TAKE APPROXIMATELY 1 HOUR  PLEASE NOTIFY SCHEDULING AT LEAST 24 HOURS  IN ADVANCE IF YOU ARE UNABLE TO KEEP YOUR APPOINTMENT. [phone]  Please call Marchia Bond, cardiac imaging nurse navigator with any questions/concerns. Marchia Bond RN Navigator Cardiac Imaging Gordy Clement RN Navigator Cardiac Imaging [facility] V[facility] Services [phone] Office  ]  Important Information About Sugar

## 2021-10-26 DIAGNOSIS — Z23 Encounter for immunization: Secondary | ICD-10-CM | POA: Diagnosis not present

## 2021-10-27 ENCOUNTER — Ambulatory Visit: Payer: Medicare Other | Admitting: Internal Medicine

## 2021-10-30 ENCOUNTER — Encounter: Payer: Medicare Other | Admitting: Physical Therapy

## 2021-11-09 ENCOUNTER — Encounter: Payer: Self-pay | Admitting: Physical Therapy

## 2021-11-09 ENCOUNTER — Ambulatory Visit (INDEPENDENT_AMBULATORY_CARE_PROVIDER_SITE_OTHER): Payer: Medicare Other | Admitting: Physical Therapy

## 2021-11-09 DIAGNOSIS — M25551 Pain in right hip: Secondary | ICD-10-CM | POA: Diagnosis not present
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
## 2021-12-14 DIAGNOSIS — Z23 Encounter for immunization: Secondary | ICD-10-CM | POA: Diagnosis not present

## 2021-12-21 DIAGNOSIS — Z1283 Encounter for screening for malignant neoplasm of skin: Secondary | ICD-10-CM | POA: Diagnosis not present

## 2021-12-21 DIAGNOSIS — D225 Melanocytic nevi of trunk: Secondary | ICD-10-CM | POA: Diagnosis not present

## 2021-12-30 DIAGNOSIS — Z23 Encounter for immunization: Secondary | ICD-10-CM | POA: Diagnosis not present

## 2022-01-07 ENCOUNTER — Telehealth (HOSPITAL_COMMUNITY): Payer: Self-pay | Admitting: *Deleted

## 2022-01-07 NOTE — Telephone Encounter (Signed)
Attempted to call patient regarding upcoming cardiac MRI appointment. Left message on voicemail with name and callback number  Ermelinda Eckert RN Navigator Cardiac Imaging [place] [facility] [phone] Office [phone] Cell  

## 2022-01-07 NOTE — Telephone Encounter (Signed)
Reaching out to patient to offer assistance regarding upcoming cardiac imaging study; pt verbalizes understanding of appt date/time, parking situation and where to check in, and verified current allergies; name and call back number provided for further questions should they arise  Gearldene Fiorenza RN Navigator Cardiac Imaging [place] [facility] [phone] office [phone] cell  Patient denies metal or claustrophobia. 

## 2022-01-08 ENCOUNTER — Ambulatory Visit (HOSPITAL_COMMUNITY)
Admission: RE | Admit: 2022-01-08 | Discharge: 2022-01-08 | Disposition: A | Discharge status: Home / Self Care | Payer: Medicare Other | Source: Ambulatory Visit | Attending: Internal Medicine | Admitting: Internal Medicine

## 2022-01-08 ENCOUNTER — Other Ambulatory Visit: Payer: Self-pay | Admitting: Internal Medicine

## 2022-01-08 DIAGNOSIS — I422 Other hypertrophic cardiomyopathy: Secondary | ICD-10-CM

## 2022-01-08 MED ORDER — GADOBUTROL 1 MMOL/ML IV SOLN
9.0000 mL | Freq: Once | INTRAVENOUS | Status: AC | PRN
  Administered 2022-01-08: 9 mL via INTRAVENOUS

## 2022-01-20 ENCOUNTER — Other Ambulatory Visit: Payer: Self-pay | Admitting: Internal Medicine

## 2022-02-09 IMAGING — CR DG FOOT COMPLETE 3+V*L*
3 series · 3 of 3 positions shown · non-contrast
Comparison: None.

CLINICAL DATA: Posttraumatic left lateral foot and ankle pain.

EXAM:
LEFT FOOT - COMPLETE 3+ VIEW

[x foot ap left]
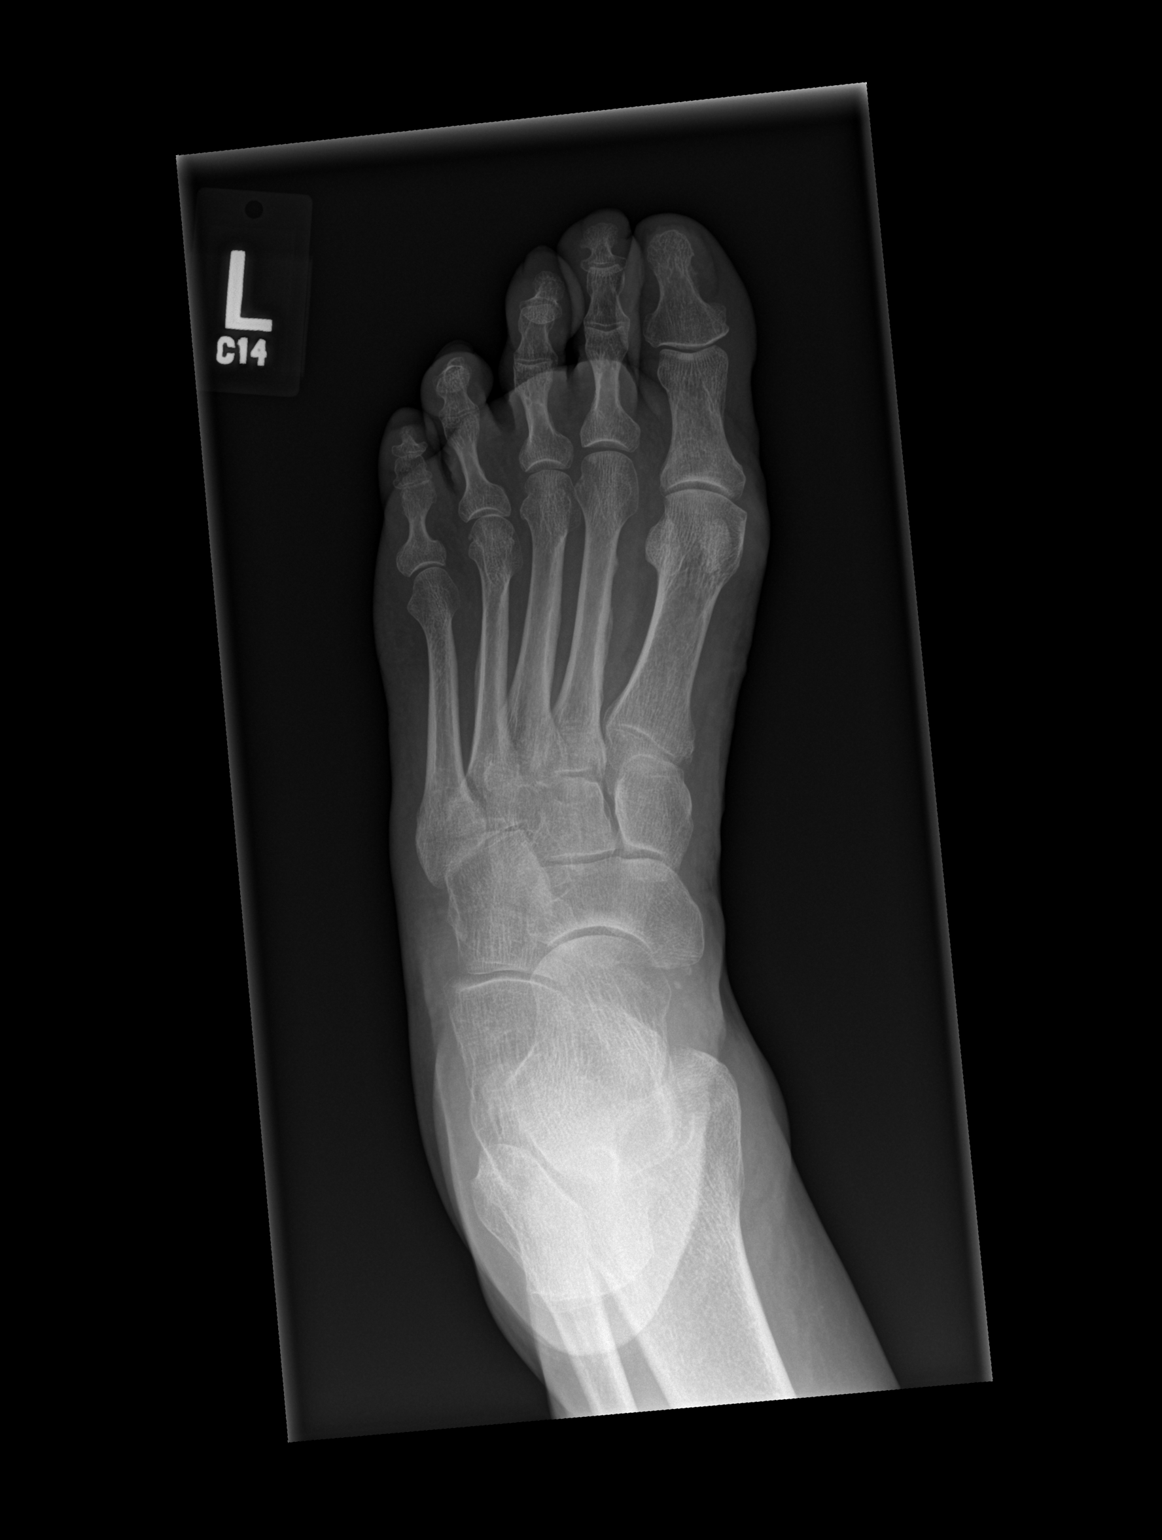

[x foot obl left]
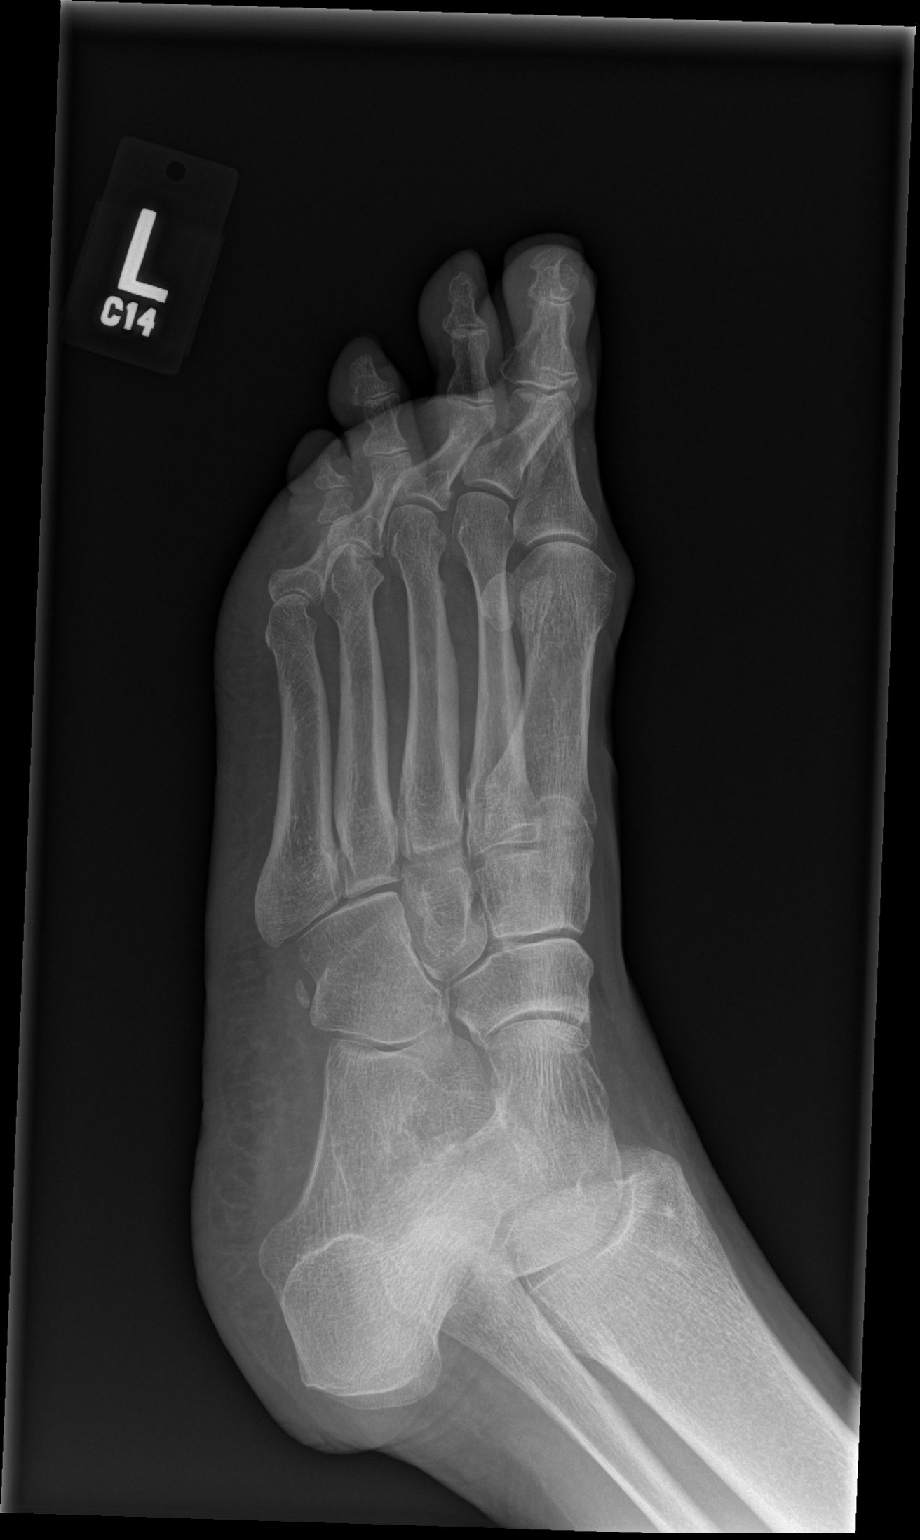

[x foot lat left]
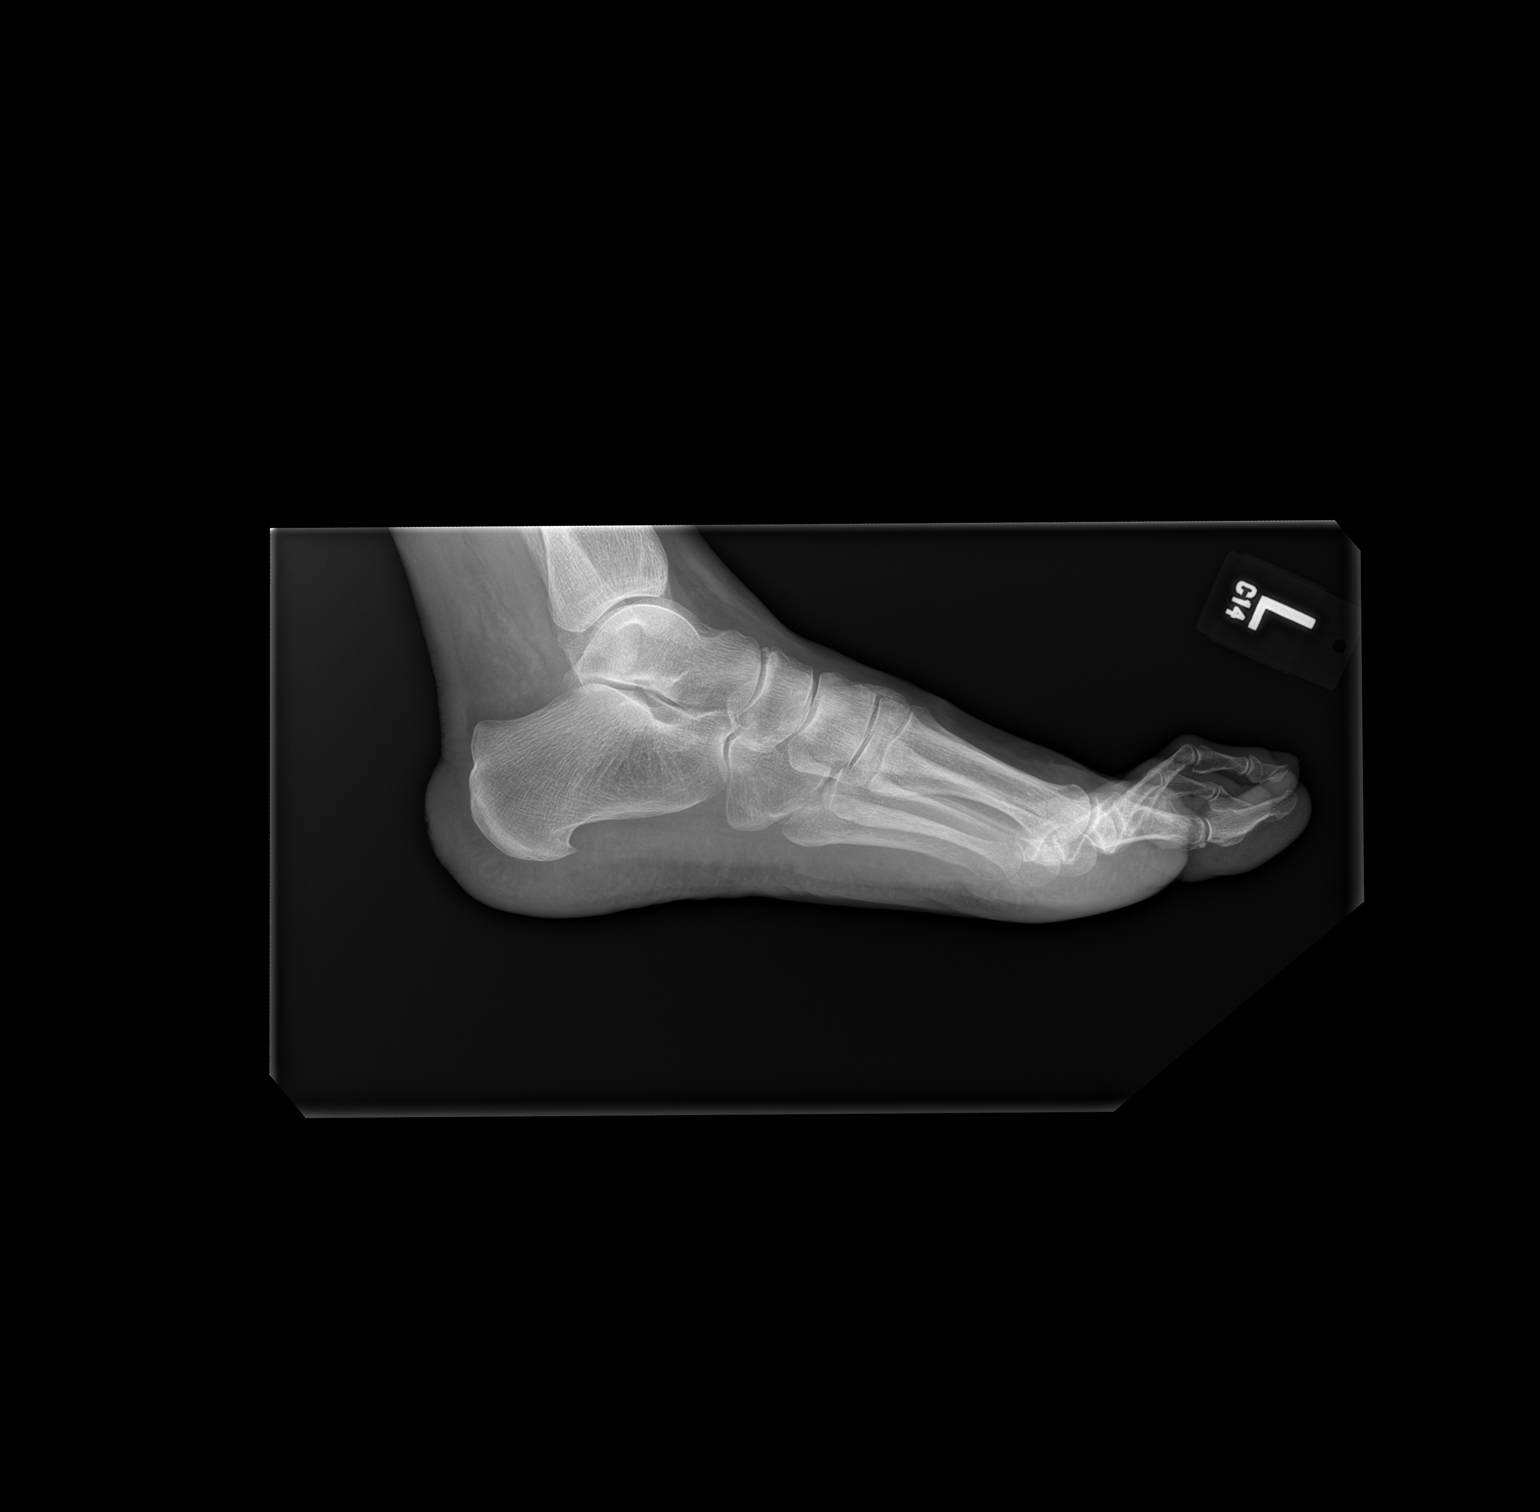

[3 of 3 positions shown; findings below may reference images not displayed]

FINDINGS: No visible fracture or malalignment. Please see ankle report
concerning the lateral process talus. No acute soft tissue finding
in the foot.
IMPRESSION: No visible fracture or malalignment.

## 2022-02-22 ENCOUNTER — Encounter: Payer: Self-pay | Admitting: Internal Medicine

## 2022-02-23 NOTE — Telephone Encounter (Signed)
Pt has Medicare for insurance.Marland KitchenJohny Murphy

## 2022-02-26 ENCOUNTER — Other Ambulatory Visit (INDEPENDENT_AMBULATORY_CARE_PROVIDER_SITE_OTHER): Payer: Medicare Other

## 2022-02-26 ENCOUNTER — Other Ambulatory Visit: Payer: Self-pay | Admitting: Internal Medicine

## 2022-02-26 DIAGNOSIS — R7989 Other specified abnormal findings of blood chemistry: Secondary | ICD-10-CM | POA: Diagnosis not present

## 2022-02-26 DIAGNOSIS — E785 Hyperlipidemia, unspecified: Secondary | ICD-10-CM

## 2022-02-26 DIAGNOSIS — R739 Hyperglycemia, unspecified: Secondary | ICD-10-CM

## 2022-02-26 LAB — COMPREHENSIVE METABOLIC PANEL
ALT: 21 U/L (ref 0–35)
AST: 21 U/L (ref 0–37)
Albumin: 4.6 g/dL (ref 3.5–5.2)
Alkaline Phosphatase: 83 U/L (ref 39–117)
BUN: 22 mg/dL (ref 6–23)
CO2: 33 mEq/L — ABNORMAL HIGH (ref 19–32)
Calcium: 10.4 mg/dL (ref 8.4–10.5)
Chloride: 104 mEq/L (ref 96–112)
Creatinine, Ser: 0.97 mg/dL (ref 0.40–1.20)
GFR: 57.56 mL/min — ABNORMAL LOW (ref >60.00)
Glucose, Bld: 110 mg/dL — ABNORMAL HIGH (ref 70–99)
Potassium: 4.2 mEq/L (ref 3.5–5.1)
Sodium: 144 mEq/L (ref 135–145)
Total Bilirubin: 0.3 mg/dL (ref 0.2–1.2)
Total Protein: 7.5 g/dL (ref 6.0–8.3)

## 2022-02-26 LAB — URINALYSIS
Bilirubin Urine: NEGATIVE
Hgb urine dipstick: NEGATIVE
Ketones, ur: NEGATIVE
Leukocytes,Ua: NEGATIVE
Nitrite: NEGATIVE
Specific Gravity, Urine: 1.015 (ref 1.000–1.030)
Total Protein, Urine: NEGATIVE
Urine Glucose: NEGATIVE
Urobilinogen, UA: 0.2 (ref 0.0–1.0)
pH: 7.5 (ref 5.0–8.0)

## 2022-02-26 LAB — CBC WITH DIFFERENTIAL/PLATELET
Basophils Absolute: 0.1 10*3/uL (ref 0.0–0.1)
Basophils Relative: 1 % (ref 0.0–3.0)
Eosinophils Absolute: 0.2 10*3/uL (ref 0.0–0.7)
Eosinophils Relative: 4.7 % (ref 0.0–5.0)
HCT: 38.8 % (ref 36.0–46.0)
Hemoglobin: 13 g/dL (ref 12.0–15.0)
Lymphocytes Relative: 27.9 % (ref 12.0–46.0)
Lymphs Abs: 1.4 10*3/uL (ref 0.7–4.0)
MCHC: 33.5 g/dL (ref 30.0–36.0)
MCV: 86.6 fl (ref 78.0–100.0)
Monocytes Absolute: 0.4 10*3/uL (ref 0.1–1.0)
Monocytes Relative: 7.7 % (ref 3.0–12.0)
Neutro Abs: 2.9 10*3/uL (ref 1.4–7.7)
Neutrophils Relative %: 58.7 % (ref 43.0–77.0)
Platelets: 293 10*3/uL (ref 150.0–400.0)
RBC: 4.47 Mil/uL (ref 3.87–5.11)
RDW: 12.9 % (ref 11.5–15.5)
WBC: 4.9 10*3/uL (ref 4.0–10.5)

## 2022-02-26 LAB — HEMOGLOBIN A1C: Hgb A1c MFr Bld: 6.9 % — ABNORMAL HIGH (ref 4.6–6.5)

## 2022-02-26 LAB — LIPID PANEL
Cholesterol: 158 mg/dL (ref 0–200)
HDL: 73.1 mg/dL (ref >39.00)
LDL Cholesterol: 63 mg/dL (ref 0–99)
NonHDL: 84.88
Total CHOL/HDL Ratio: 2
Triglycerides: 111 mg/dL (ref 0.0–149.0)
VLDL: 22.2 mg/dL (ref 0.0–40.0)

## 2022-02-26 LAB — TSH: TSH: 3.48 u[IU]/mL (ref 0.35–5.50)

## 2022-03-01 ENCOUNTER — Ambulatory Visit (INDEPENDENT_AMBULATORY_CARE_PROVIDER_SITE_OTHER): Payer: Medicare Other | Admitting: Internal Medicine

## 2022-03-01 ENCOUNTER — Encounter: Payer: Self-pay | Admitting: Internal Medicine

## 2022-03-01 VITALS — BP 110/70 | HR 71 | Temp 98.0°F | Ht 62.0 in | Wt 135.0 lb

## 2022-03-01 DIAGNOSIS — E119 Type 2 diabetes mellitus without complications: Secondary | ICD-10-CM | POA: Insufficient documentation

## 2022-03-01 DIAGNOSIS — I5189 Other ill-defined heart diseases: Secondary | ICD-10-CM | POA: Diagnosis not present

## 2022-03-01 DIAGNOSIS — E7800 Pure hypercholesterolemia, unspecified: Secondary | ICD-10-CM | POA: Diagnosis not present

## 2022-03-01 DIAGNOSIS — R350 Frequency of micturition: Secondary | ICD-10-CM

## 2022-03-01 DIAGNOSIS — Z0000 Encounter for general adult medical examination without abnormal findings: Secondary | ICD-10-CM | POA: Diagnosis not present

## 2022-03-01 MED ORDER — LINACLOTIDE 72 MCG PO CAPS: 72.0000 ug | ORAL_CAPSULE | Freq: Every day | ORAL | 3 refills | Status: DC

## 2022-03-01 MED ORDER — CITALOPRAM HYDROBROMIDE 20 MG PO TABS: 20.0000 mg | ORAL_TABLET | Freq: Every day | ORAL | 1 refills | Status: DC

## 2022-03-01 MED ORDER — CITALOPRAM HYDROBROMIDE 40 MG PO TABS: 40.0000 mg | ORAL_TABLET | Freq: Every day | ORAL | 1 refills | Status: DC

## 2022-03-01 MED ORDER — METFORMIN HCL 500 MG PO TABS: 500.0000 mg | ORAL_TABLET | Freq: Every day | ORAL | 3 refills | Status: DC

## 2022-03-01 NOTE — Assessment & Plan Note (Signed)
Worse Start Metformin po

## 2022-03-01 NOTE — Progress Notes (Signed)
Subjective:  Patient ID: Evelyn Murphy, female    DOB: 11-26-1945  Age: 76 y.o. MRN: [id number]  CC: Annual Exam   HPI Evelyn Murphy presents for a well exam   Outpatient Medications Prior to Visit  Medication Sig Dispense Refill   aspirin EC 81 MG tablet Take 1 tablet (81 mg total) by mouth daily. 30 tablet 0   doxepin (SINEQUAN) 75 MG capsule TAKE 1 OR 2 CAPSULES AT BEDTIME 180 capsule 1   ezetimibe (ZETIA) 10 MG tablet TAKE 1 TABLET BY MOUTH EVERY DAY 90 tablet 2   Multiple Vitamins-Minerals (MULTIVITAMIN WITH MINERALS) tablet Take 1 tablet by mouth daily.      rosuvastatin (CRESTOR) 10 MG tablet TAKE 1 TABLET BY MOUTH EVERY DAY 90 tablet 2   UNABLE TO FIND Med Name: Valerian Root 2 per day     citalopram (CELEXA) 40 MG tablet Take 1 tablet (40 mg total) by mouth daily. Annual appt due in Dec w/labs must see provider for future refills 90 tablet 0   No facility-administered medications prior to visit.    ROS: Review of Systems  Constitutional:  Negative for activity change, appetite change, chills, fatigue and unexpected weight change.  HENT:  Negative for congestion, mouth sores and sinus pressure.   Eyes:  Negative for visual disturbance.  Respiratory:  Negative for cough and chest tightness.   Gastrointestinal:  Negative for abdominal pain and nausea.  Genitourinary:  Negative for difficulty urinating, frequency and vaginal pain.  Musculoskeletal:  Negative for back pain and gait problem.  Skin:  Negative for pallor and rash.  Neurological:  Negative for dizziness, tremors, weakness, numbness and headaches.  Psychiatric/Behavioral:  Negative for confusion and sleep disturbance.     Objective:  BP 110/70 (BP Location: Left Arm, Patient Position: Sitting, Cuff Size: Normal)   Pulse 71   Temp 98 F (36.7 C) (Oral)   Ht '5\' 2"'$  (1.575 m)   Wt 135 lb (61.2 kg)   SpO2 94%   BMI 24.69 kg/m   BP Readings from Last 3 Encounters:  03/01/22 110/70  10/23/21 117/76   10/01/21 92/60    Wt Readings from Last 3 Encounters:  03/01/22 135 lb (61.2 kg)  10/23/21 135 lb (61.2 kg)  10/01/21 135 lb (61.2 kg)    Physical Exam Constitutional:      General: She is not in acute distress.    Appearance: Normal appearance. She is well-developed.  HENT:     Head: Normocephalic.     Right Ear: External ear normal.     Left Ear: External ear normal.     Nose: Nose normal.  Eyes:     General:        Right eye: No discharge.        Left eye: No discharge.     Conjunctiva/sclera: Conjunctivae normal.     Pupils: Pupils are equal, round, and reactive to light.  Neck:     Thyroid: No thyromegaly.     Vascular: No JVD.     Trachea: No tracheal deviation.  Cardiovascular:     Rate and Rhythm: Normal rate and regular rhythm.     Heart sounds: Normal heart sounds.  Pulmonary:     Effort: No respiratory distress.     Breath sounds: No stridor. No wheezing.  Abdominal:     General: Bowel sounds are normal. There is no distension.     Palpations: Abdomen is soft. There is no mass.  Tenderness: There is no abdominal tenderness. There is no guarding or rebound.  Musculoskeletal:        General: No tenderness.     Cervical back: Normal range of motion and neck supple. No rigidity.  Lymphadenopathy:     Cervical: No cervical adenopathy.  Skin:    Findings: No erythema or rash.  Neurological:     Cranial Nerves: No cranial nerve deficit.     Motor: No abnormal muscle tone.     Coordination: Coordination normal.     Deep Tendon Reflexes: Reflexes normal.  Psychiatric:        Behavior: Behavior normal.        Thought Content: Thought content normal.        Judgment: Judgment normal.    Lab Results  Component Value Date   WBC 4.9 02/26/2022   HGB 13.0 02/26/2022   HCT 38.8 02/26/2022   PLT 293.0 02/26/2022   GLUCOSE 110 (H) 02/26/2022   CHOL 158 02/26/2022   TRIG 111.0 02/26/2022   HDL 73.10 02/26/2022   LDLDIRECT 135.5 01/24/2013   LDLCALC 63  02/26/2022   ALT 21 02/26/2022   AST 21 02/26/2022   NA 144 02/26/2022   K 4.2 02/26/2022   CL 104 02/26/2022   CREATININE 0.97 02/26/2022   BUN 22 02/26/2022   CO2 33 (H) 02/26/2022   TSH 3.48 02/26/2022   INR 0.96 06/29/2014   HGBA1C 6.9 (H) 02/26/2022   MICROALBUR <0.7 03/05/2020    MR CARDIAC MORPHOLOGY W WO CONTRAST  Result Date: 01/11/2022 CLINICAL DATA:  Clinical question of hypertrophic cardiomyopathy Study assumes HCT of 38 and BSA of 1.64 m2. EXAM: CARDIAC MRI TECHNIQUE: The patient was scanned on a 1.5 Tesla GE magnet. A dedicated cardiac coil was used. Functional imaging was done using Fiesta sequences. 2,3, and 4 chamber views were done to assess for RWMA's. Modified Simpson's rule using a short axis stack was used to calculate an ejection fraction on a dedicated work Conservation officer, nature. The patient received 9 cc of Gadavist. After 10 minutes inversion recovery sequences were used to assess for infiltration and scar tissue. Velocity encoding sequences performed for valve assessment. CONTRAST:  9 cc  of Gadavist FINDINGS: 1. Normal left ventricular size, with LVEDD 42 mm, and LVEDVi 56 mL/m2. Mild asymmetric septal hypertrophy, with intraventricular septal thickness of 12 mm, posterior wall thickness of 6 mm, and myocardial mass index of 42 g/m2. Normal left ventricular systolic function (LVEF =34%). There is chordal-systolic anterior motion of the anterior mitral valve leaflet. Peak gradient < 10 mm Hg. Left ventricular parametric mapping notable for normal T2 and ECV signal. There is no late gadolinium enhancement in the left ventricular myocardium. Six standard deviation used. 2. Normal right ventricular size with RVEDVI 61 mL/m2. Normal right ventricular thickness. Normal right ventricular systolic function (RVEF =74%). There are no regional wall motion abnormalities or aneurysms. 3.  Normal left and right atrial size. 4. Normal size of the aortic root, ascending aorta  and pulmonary artery. 5. Valve assessment: Aortic Valve: Tri-leaflet aortic valve. There is no significant regurgitation. Regurgitant fraction 1% Pulmonic Valve: There is no significant regurgitation. Regurgitant fraction 2% Tricuspid Valve: There is no significant regurgitation. Regurgitant fraction 3% Mitral Valve: Anterior mitral valve billowing without true prolapse. There is mild regurgitation. Regurgitant fraction 10% 6.  Normal pericardium.  No pericardial effusion. 7. Grossly, no extracardiac findings. Recommended dedicated study if concerned for non-cardiac pathology. IMPRESSION: Study does not meet criteria for hypertrophic  cardiomyopathy. Suspect mild LVOT gradient related to hyperdynamic LV function. Rudean Haskell MD Electronically Signed   By: Rudean Haskell M.D.   On: 01/11/2022 15:19   MR CARDIAC VELOCITY FLOW MAP  Result Date: 01/11/2022 CLINICAL DATA:  Clinical question of hypertrophic cardiomyopathy Study assumes HCT of 38 and BSA of 1.64 m2. EXAM: CARDIAC MRI TECHNIQUE: The patient was scanned on a 1.5 Tesla GE magnet. A dedicated cardiac coil was used. Functional imaging was done using Fiesta sequences. 2,3, and 4 chamber views were done to assess for RWMA's. Modified Simpson's rule using a short axis stack was used to calculate an ejection fraction on a dedicated work Conservation officer, nature. The patient received 9 cc of Gadavist. After 10 minutes inversion recovery sequences were used to assess for infiltration and scar tissue. Velocity encoding sequences performed for valve assessment. CONTRAST:  9 cc  of Gadavist FINDINGS: 1. Normal left ventricular size, with LVEDD 42 mm, and LVEDVi 56 mL/m2. Mild asymmetric septal hypertrophy, with intraventricular septal thickness of 12 mm, posterior wall thickness of 6 mm, and myocardial mass index of 42 g/m2. Normal left ventricular systolic function (LVEF =63%). There is chordal-systolic anterior motion of the anterior mitral  valve leaflet. Peak gradient < 10 mm Hg. Left ventricular parametric mapping notable for normal T2 and ECV signal. There is no late gadolinium enhancement in the left ventricular myocardium. Six standard deviation used. 2. Normal right ventricular size with RVEDVI 61 mL/m2. Normal right ventricular thickness. Normal right ventricular systolic function (RVEF =01%). There are no regional wall motion abnormalities or aneurysms. 3.  Normal left and right atrial size. 4. Normal size of the aortic root, ascending aorta and pulmonary artery. 5. Valve assessment: Aortic Valve: Tri-leaflet aortic valve. There is no significant regurgitation. Regurgitant fraction 1% Pulmonic Valve: There is no significant regurgitation. Regurgitant fraction 2% Tricuspid Valve: There is no significant regurgitation. Regurgitant fraction 3% Mitral Valve: Anterior mitral valve billowing without true prolapse. There is mild regurgitation. Regurgitant fraction 10% 6.  Normal pericardium.  No pericardial effusion. 7. Grossly, no extracardiac findings. Recommended dedicated study if concerned for non-cardiac pathology. IMPRESSION: Study does not meet criteria for hypertrophic cardiomyopathy. Suspect mild LVOT gradient related to hyperdynamic LV function. Rudean Haskell MD Electronically Signed   By: Rudean Haskell M.D.   On: 01/11/2022 15:19    Assessment & Plan:   Problem List Items Addressed This Visit     Diabetes mellitus type 2 in nonobese ([place]) - Primary    Worse Start Metformin po      Relevant Medications   metFORMIN (GLUCOPHAGE) 500 MG tablet   Other Relevant Orders   TSH   Urinalysis   CBC with Differential/Platelet   Lipid panel   Comprehensive metabolic panel   Hemoglobin S0F   Diastolic dysfunction   Hypercholesterolemia   Relevant Orders   TSH   Urinalysis   Lipid panel   Urinary frequency   Relevant Orders   Urinalysis   Well adult exam    We discussed age appropriate health related issues,  including available/recomended screening tests and vaccinations. Labs were ordered to be later reviewed . All questions were answered. We discussed one or more of the following - seat belt use, use of sunscreen/sun exposure exercise, fall risk reduction, second hand smoke exposure, firearm use and storage, seat belt use, a need for adhering to healthy diet and exercise. Labs were ordered.  All questions were answered. Colon due 2021 Dr Carlean Purl Colon 2022 -  no need for more -  Dr Carlean Purl         Meds ordered this encounter  Medications   DISCONTD: citalopram (CELEXA) 40 MG tablet    Sig: Take 1 tablet (40 mg total) by mouth daily. Annual appt due in Dec w/labs must see provider for future refills    Dispense:  90 tablet    Refill:  1   citalopram (CELEXA) 20 MG tablet    Sig: Take 1 tablet (20 mg total) by mouth daily.    Dispense:  90 tablet    Refill:  1   metFORMIN (GLUCOPHAGE) 500 MG tablet    Sig: Take 1 tablet (500 mg total) by mouth daily with breakfast.    Dispense:  90 tablet    Refill:  3   linaclotide (LINZESS) 72 MCG capsule    Sig: Take 1 capsule (72 mcg total) by mouth daily before breakfast.    Dispense:  90 capsule    Refill:  3      Follow-up: Return in about 6 months (around 08/31/2022) for a follow-up visit.  Walker Kehr, MD

## 2022-03-01 NOTE — Assessment & Plan Note (Addendum)
We discussed age appropriate health related issues, including available/recomended screening tests and vaccinations. Labs were ordered to be later reviewed . All questions were answered. We discussed one or more of the following - seat belt use, use of sunscreen/sun exposure exercise, fall risk reduction, second hand smoke exposure, firearm use and storage, seat belt use, a need for adhering to healthy diet and exercise. Labs were ordered.  All questions were answered. Colon due 2021 Dr Carlean Purl Colon 2022 - no need for more -  Dr Carlean Purl

## 2022-04-11 ENCOUNTER — Other Ambulatory Visit: Payer: Self-pay | Admitting: Internal Medicine

## 2022-04-22 ENCOUNTER — Other Ambulatory Visit: Payer: Self-pay | Admitting: Internal Medicine

## 2022-06-23 NOTE — Progress Notes (Unsigned)
   Shirlyn Goltz, PhD, LAT, ATC acting as a scribe for Lynne Leader, MD.  Evelyn Murphy is a 77 y.o. female who presents to [place] [facility] today for exacerbation of her R hip pain. Pt was last seen by Dr. Georgina Snell on 10/01/21 and was given a R GT steroid injection and was referred to PT, completing 2 visits (last visit on 11/09/21).  Today, pt reports R hip pain returned about 1 month ago. Pt locates pain to the lateral aspect of her R hip. She had been working on her previous HEP, but is unable to do the stationary bike.  Dx imaging: 09/17/21 R hip XR   Pertinent review of systems: No fevers or chills  Relevant historical information: Diabetes   Exam:  BP 130/82   Pulse 77   Ht 5\' 2"  (1.575 m)   SpO2 94%   BMI 24.69 kg/m  General: Well Developed, well nourished, and in no acute distress.   MSK: Right hip: Normal-appearing Tender palpation greater trochanter.  Normal hip motion.  Hip abduction strength intact.  Resisted hip abduction strength does somewhat reproduce pain.    Lab and Radiology Results  Procedure: Real-time Ultrasound Guided Injection of right lateral hip greater trochanter bursa Device: Philips Affiniti 50G Images permanently stored and available for review in PACS Verbal informed consent obtained.  Discussed risks and benefits of procedure. Warned about infection, bleeding, hyperglycemia damage to structures among others. Patient expresses understanding and agreement Time-out conducted.   Noted no overlying erythema, induration, or other signs of local infection.   Skin prepped in a sterile fashion.   Local anesthesia: Topical Ethyl chloride.   With sterile technique and under real time ultrasound guidance: 40 mg of Kenalog and 2 mL Marcaine injected into right hip greater trochanter bursa. Fluid seen entering the bursa.   Completed without difficulty   Pain immediately resolved suggesting accurate placement of the medication.    Advised to call if fevers/chills, erythema, induration, drainage, or persistent bleeding.   Images permanently stored and available for review in the ultrasound unit.  Impression: Technically successful ultrasound guided injection.        Assessment and Plan: 77 y.o. female with acute exacerbation of chronic right lateral hip pain.  This is a recurrent problem now.  She already is doing a great job at home exercise program taught previously in clinic and add in physical therapy.  She has preserved hip strength pretty well.  Pain has returned however.  Will repeat steroid injection.  Last injection was July 2023. Recheck back as needed.  If this becomes a more recurrent problem may benefit from more dedicated physical therapy or perhaps even advanced imaging.   PDMP not reviewed this encounter. Orders Placed This Encounter  Procedures   [place] LIMITED JOINT SPACE STRUCTURES LOW RIGHT(NO LINKED CHARGES)    Order Specific Question:   Reason for Exam (SYMPTOM  OR DIAGNOSIS REQUIRED)    Answer:   right hip pain    Order Specific Question:   Preferred imaging location?    Answer:   [place]   No orders of the defined types were placed in this encounter.    Discussed warning signs or symptoms. Please see discharge instructions. Patient expresses understanding.   The above documentation has been reviewed and is accurate and complete Lynne Leader, M.D.

## 2022-06-24 ENCOUNTER — Ambulatory Visit (INDEPENDENT_AMBULATORY_CARE_PROVIDER_SITE_OTHER): Payer: Medicare Other | Admitting: Family Medicine

## 2022-06-24 ENCOUNTER — Other Ambulatory Visit: Payer: Self-pay

## 2022-06-24 VITALS — BP 130/82 | HR 77 | Ht 62.0 in

## 2022-06-24 DIAGNOSIS — M25551 Pain in right hip: Secondary | ICD-10-CM

## 2022-06-24 DIAGNOSIS — M7061 Trochanteric bursitis, right hip: Secondary | ICD-10-CM | POA: Diagnosis not present

## 2022-06-24 NOTE — Patient Instructions (Addendum)
Thank you for coming in today.   You received an injection today. Seek immediate medical attention if the joint becomes red, extremely painful, or is oozing fluid.   Continue working on home exercises  Check back as needed

## 2022-07-01 ENCOUNTER — Ambulatory Visit: Payer: Medicare Other | Admitting: Family Medicine

## 2022-08-12 ENCOUNTER — Ambulatory Visit: Payer: Medicare Other | Admitting: Orthopaedic Surgery

## 2022-08-26 ENCOUNTER — Telehealth: Payer: Self-pay | Admitting: Radiology

## 2022-08-26 NOTE — Telephone Encounter (Signed)
Left voice mail for patient to call back at [phone] to schedule Medicare Annual Wellness Visit    Last AWV:  05/25/2017   Please schedule Sequential/Initial AWV with [facility] K. CMA

## 2022-08-30 ENCOUNTER — Other Ambulatory Visit (INDEPENDENT_AMBULATORY_CARE_PROVIDER_SITE_OTHER): Payer: Medicare Other

## 2022-08-30 DIAGNOSIS — E119 Type 2 diabetes mellitus without complications: Secondary | ICD-10-CM

## 2022-08-30 DIAGNOSIS — R350 Frequency of micturition: Secondary | ICD-10-CM

## 2022-08-30 DIAGNOSIS — E7800 Pure hypercholesterolemia, unspecified: Secondary | ICD-10-CM

## 2022-08-30 LAB — LIPID PANEL
Cholesterol: 149 mg/dL (ref 0–200)
HDL: 69.6 mg/dL (ref >39.00)
NonHDL: 79.51
Total CHOL/HDL Ratio: 2
Triglycerides: 217 mg/dL — ABNORMAL HIGH (ref 0.0–149.0)
VLDL: 43.4 mg/dL — ABNORMAL HIGH (ref 0.0–40.0)

## 2022-08-30 LAB — CBC WITH DIFFERENTIAL/PLATELET
Basophils Absolute: 0 10*3/uL (ref 0.0–0.1)
Basophils Relative: 0.6 % (ref 0.0–3.0)
Eosinophils Absolute: 0.2 10*3/uL (ref 0.0–0.7)
Eosinophils Relative: 2.8 % (ref 0.0–5.0)
HCT: 39.2 % (ref 36.0–46.0)
Hemoglobin: 12.8 g/dL (ref 12.0–15.0)
Lymphocytes Relative: 29.2 % (ref 12.0–46.0)
Lymphs Abs: 1.9 10*3/uL (ref 0.7–4.0)
MCHC: 32.7 g/dL (ref 30.0–36.0)
MCV: 88 fl (ref 78.0–100.0)
Monocytes Absolute: 0.6 10*3/uL (ref 0.1–1.0)
Monocytes Relative: 8.8 % (ref 3.0–12.0)
Neutro Abs: 3.7 10*3/uL (ref 1.4–7.7)
Neutrophils Relative %: 58.6 % (ref 43.0–77.0)
Platelets: 311 10*3/uL (ref 150.0–400.0)
RBC: 4.45 Mil/uL (ref 3.87–5.11)
RDW: 12.9 % (ref 11.5–15.5)
WBC: 6.4 10*3/uL (ref 4.0–10.5)

## 2022-08-30 LAB — COMPREHENSIVE METABOLIC PANEL
ALT: 21 U/L (ref 0–35)
AST: 20 U/L (ref 0–37)
Albumin: 4.5 g/dL (ref 3.5–5.2)
Alkaline Phosphatase: 66 U/L (ref 39–117)
BUN: 26 mg/dL — ABNORMAL HIGH (ref 6–23)
CO2: 32 mEq/L (ref 19–32)
Calcium: 9.8 mg/dL (ref 8.4–10.5)
Chloride: 100 mEq/L (ref 96–112)
Creatinine, Ser: 0.89 mg/dL (ref 0.40–1.20)
GFR: 63.6 mL/min (ref >60.00)
Glucose, Bld: 124 mg/dL — ABNORMAL HIGH (ref 70–99)
Potassium: 4.2 mEq/L (ref 3.5–5.1)
Sodium: 141 mEq/L (ref 135–145)
Total Bilirubin: 0.3 mg/dL (ref 0.2–1.2)
Total Protein: 7.1 g/dL (ref 6.0–8.3)

## 2022-08-30 LAB — TSH: TSH: 3.71 u[IU]/mL (ref 0.35–5.50)

## 2022-08-31 LAB — URINALYSIS, ROUTINE W REFLEX MICROSCOPIC
Bilirubin Urine: NEGATIVE
Hgb urine dipstick: NEGATIVE
Ketones, ur: NEGATIVE
Nitrite: NEGATIVE
RBC / HPF: NONE SEEN (ref 0–?)
Specific Gravity, Urine: 1.02 (ref 1.000–1.030)
Total Protein, Urine: NEGATIVE
Urine Glucose: NEGATIVE
Urobilinogen, UA: 1 (ref 0.0–1.0)
pH: 6.5 (ref 5.0–8.0)

## 2022-08-31 LAB — LDL CHOLESTEROL, DIRECT: Direct LDL: 57 mg/dL

## 2022-08-31 LAB — HEMOGLOBIN A1C: Hgb A1c MFr Bld: 6.5 % (ref 4.6–6.5)

## 2022-09-02 ENCOUNTER — Ambulatory Visit (INDEPENDENT_AMBULATORY_CARE_PROVIDER_SITE_OTHER): Payer: Medicare Other | Admitting: Internal Medicine

## 2022-09-02 ENCOUNTER — Other Ambulatory Visit: Payer: Self-pay | Admitting: Internal Medicine

## 2022-09-02 ENCOUNTER — Encounter: Payer: Self-pay | Admitting: Internal Medicine

## 2022-09-02 VITALS — BP 112/70 | HR 75 | Temp 98.6°F | Ht 62.0 in

## 2022-09-02 DIAGNOSIS — F418 Other specified anxiety disorders: Secondary | ICD-10-CM

## 2022-09-02 DIAGNOSIS — Z7984 Long term (current) use of oral hypoglycemic drugs: Secondary | ICD-10-CM

## 2022-09-02 DIAGNOSIS — D649 Anemia, unspecified: Secondary | ICD-10-CM | POA: Diagnosis not present

## 2022-09-02 DIAGNOSIS — Z0000 Encounter for general adult medical examination without abnormal findings: Secondary | ICD-10-CM

## 2022-09-02 DIAGNOSIS — K219 Gastro-esophageal reflux disease without esophagitis: Secondary | ICD-10-CM | POA: Diagnosis not present

## 2022-09-02 DIAGNOSIS — E7800 Pure hypercholesterolemia, unspecified: Secondary | ICD-10-CM | POA: Diagnosis not present

## 2022-09-02 DIAGNOSIS — E119 Type 2 diabetes mellitus without complications: Secondary | ICD-10-CM | POA: Diagnosis not present

## 2022-09-02 DIAGNOSIS — E785 Hyperlipidemia, unspecified: Secondary | ICD-10-CM

## 2022-09-02 NOTE — Assessment & Plan Note (Signed)
Continue on Protonix

## 2022-09-02 NOTE — Assessment & Plan Note (Signed)
Continue on citalopram Evelyn Murphy is exercising at home 7 days a week, swimming 3 days a week, walking 12,000 steps a day.

## 2022-09-02 NOTE — Assessment & Plan Note (Signed)
On Crestor + Zetia. LDL at goal. °

## 2022-09-02 NOTE — Progress Notes (Signed)
Subjective:  Patient ID: Evelyn Murphy, female    DOB: 1945-05-27  Age: 77 y.o. MRN: [id number]  CC: Follow-up (6 mnth f/u)   HPI Evelyn Murphy presents for DM, dyslipidemia, depression with anxiety  Continue on citalopram Evelyn Murphy is exercising at home 7 days a week, swimming 3 days a week, walking 12,000 steps a day.   Outpatient Medications Prior to Visit  Medication Sig Dispense Refill   aspirin EC 81 MG tablet Take 1 tablet (81 mg total) by mouth daily. 30 tablet 0   doxepin (SINEQUAN) 75 MG capsule TAKE 1 TO 2 CAPSULES BY MOUTH AT BEDTIME 180 capsule 1   ezetimibe (ZETIA) 10 MG tablet Take 1 tablet (10 mg total) by mouth daily. 90 tablet 3   linaclotide (LINZESS) 72 MCG capsule Take 1 capsule (72 mcg total) by mouth daily before breakfast. 90 capsule 3   metFORMIN (GLUCOPHAGE) 500 MG tablet Take 1 tablet (500 mg total) by mouth daily with breakfast. 90 tablet 3   Multiple Vitamins-Minerals (MULTIVITAMIN WITH MINERALS) tablet Take 1 tablet by mouth daily.      rosuvastatin (CRESTOR) 10 MG tablet TAKE 1 TABLET BY MOUTH EVERY DAY 90 tablet 3   UNABLE TO FIND Med Name: Valerian Root 2 per day     citalopram (CELEXA) 20 MG tablet Take 1 tablet (20 mg total) by mouth daily. 90 tablet 1   No facility-administered medications prior to visit.    ROS: Review of Systems  Constitutional:  Negative for activity change, appetite change, chills, fatigue and unexpected weight change.  HENT:  Negative for congestion, mouth sores and sinus pressure.   Eyes:  Negative for visual disturbance.  Respiratory:  Negative for cough and chest tightness.   Gastrointestinal:  Negative for abdominal pain and nausea.  Genitourinary:  Negative for difficulty urinating, frequency and vaginal pain.  Musculoskeletal:  Negative for back pain and gait problem.  Skin:  Negative for pallor and rash.  Neurological:  Negative for dizziness, tremors, weakness, numbness and headaches.  Psychiatric/Behavioral:   Negative for confusion, sleep disturbance and suicidal ideas.     Objective:  BP 112/70 (BP Location: Right Arm, Patient Position: Sitting, Cuff Size: Normal)   Pulse 75   Temp 98.6 F (37 C) (Oral)   Ht 5\' 2"  (1.575 m)   SpO2 97%   BMI 24.69 kg/m   BP Readings from Last 3 Encounters:  09/02/22 112/70  06/24/22 130/82  03/01/22 110/70    Wt Readings from Last 3 Encounters:  03/01/22 135 lb (61.2 kg)  10/23/21 135 lb (61.2 kg)  10/01/21 135 lb (61.2 kg)    Physical Exam Constitutional:      General: She is not in acute distress.    Appearance: She is well-developed. She is obese.  HENT:     Head: Normocephalic.     Right Ear: External ear normal.     Left Ear: External ear normal.     Nose: Nose normal.  Eyes:     General:        Right eye: No discharge.        Left eye: No discharge.     Conjunctiva/sclera: Conjunctivae normal.     Pupils: Pupils are equal, round, and reactive to light.  Neck:     Thyroid: No thyromegaly.     Vascular: No JVD.     Trachea: No tracheal deviation.  Cardiovascular:     Rate and Rhythm: Normal rate and regular rhythm.  Heart sounds: Normal heart sounds.  Pulmonary:     Effort: No respiratory distress.     Breath sounds: No stridor. No wheezing.  Abdominal:     General: Bowel sounds are normal. There is no distension.     Palpations: Abdomen is soft. There is no mass.     Tenderness: There is no abdominal tenderness. There is no guarding or rebound.  Musculoskeletal:        General: No tenderness.     Cervical back: Normal range of motion and neck supple. No rigidity.  Lymphadenopathy:     Cervical: No cervical adenopathy.  Skin:    Findings: No erythema or rash.  Neurological:     Cranial Nerves: No cranial nerve deficit.     Motor: No abnormal muscle tone.     Coordination: Coordination normal.     Deep Tendon Reflexes: Reflexes normal.  Psychiatric:        Behavior: Behavior normal.        Thought Content: Thought  content normal.        Judgment: Judgment normal.     Lab Results  Component Value Date   WBC 6.4 08/30/2022   HGB 12.8 08/30/2022   HCT 39.2 08/30/2022   PLT 311.0 08/30/2022   GLUCOSE 124 (H) 08/30/2022   CHOL 149 08/30/2022   TRIG 217.0 (H) 08/30/2022   HDL 69.60 08/30/2022   LDLDIRECT 57.0 08/30/2022   LDLCALC 63 02/26/2022   ALT 21 08/30/2022   AST 20 08/30/2022   NA 141 08/30/2022   K 4.2 08/30/2022   CL 100 08/30/2022   CREATININE 0.89 08/30/2022   BUN 26 (H) 08/30/2022   CO2 32 08/30/2022   TSH 3.71 08/30/2022   INR 0.96 06/29/2014   HGBA1C 6.5 08/30/2022   MICROALBUR <0.7 03/05/2020    MR CARDIAC MORPHOLOGY W WO CONTRAST  Result Date: 01/11/2022 CLINICAL DATA:  Clinical question of hypertrophic cardiomyopathy Study assumes HCT of 38 and BSA of 1.64 m2. EXAM: CARDIAC MRI TECHNIQUE: The patient was scanned on a 1.5 Tesla GE magnet. A dedicated cardiac coil was used. Functional imaging was done using Fiesta sequences. 2,3, and 4 chamber views were done to assess for RWMA's. Modified Simpson's rule using a short axis stack was used to calculate an ejection fraction on a dedicated work [employer]. The patient received 9 cc of Gadavist. After 10 minutes inversion recovery sequences were used to assess for infiltration and scar tissue. Velocity encoding sequences performed for valve assessment. CONTRAST:  9 cc  of Gadavist FINDINGS: 1. Normal left ventricular size, with LVEDD 42 mm, and LVEDVi 56 mL/m2. Mild asymmetric septal hypertrophy, with intraventricular septal thickness of 12 mm, posterior wall thickness of 6 mm, and myocardial mass index of 42 g/m2. Normal left ventricular systolic function (LVEF =67%). There is chordal-systolic anterior motion of the anterior mitral valve leaflet. Peak gradient < 10 mm Hg. Left ventricular parametric mapping notable for normal T2 and ECV signal. There is no late gadolinium enhancement in the left ventricular myocardium.  Six standard deviation used. 2. Normal right ventricular size with RVEDVI 61 mL/m2. Normal right ventricular thickness. Normal right ventricular systolic function (RVEF =59%). There are no regional wall motion abnormalities or aneurysms. 3.  Normal left and right atrial size. 4. Normal size of the aortic root, ascending aorta and pulmonary artery. 5. Valve assessment: Aortic Valve: Tri-leaflet aortic valve. There is no significant regurgitation. Regurgitant fraction 1% Pulmonic Valve: There is no significant regurgitation. Regurgitant  fraction 2% Tricuspid Valve: There is no significant regurgitation. Regurgitant fraction 3% Mitral Valve: Anterior mitral valve billowing without true prolapse. There is mild regurgitation. Regurgitant fraction 10% 6.  Normal pericardium.  No pericardial effusion. 7. Grossly, no extracardiac findings. Recommended dedicated study if concerned for non-cardiac pathology. IMPRESSION: Study does not meet criteria for hypertrophic cardiomyopathy. Suspect mild LVOT gradient related to hyperdynamic LV function. Riley Lam MD Electronically Signed   By: Riley Lam M.D.   On: 01/11/2022 15:19   MR CARDIAC VELOCITY FLOW MAP  Result Date: 01/11/2022 CLINICAL DATA:  Clinical question of hypertrophic cardiomyopathy Study assumes HCT of 38 and BSA of 1.64 m2. EXAM: CARDIAC MRI TECHNIQUE: The patient was scanned on a 1.5 Tesla GE magnet. A dedicated cardiac coil was used. Functional imaging was done using Fiesta sequences. 2,3, and 4 chamber views were done to assess for RWMA's. Modified Simpson's rule using a short axis stack was used to calculate an ejection fraction on a dedicated work [employer]. The patient received 9 cc of Gadavist. After 10 minutes inversion recovery sequences were used to assess for infiltration and scar tissue. Velocity encoding sequences performed for valve assessment. CONTRAST:  9 cc  of Gadavist FINDINGS: 1. Normal left  ventricular size, with LVEDD 42 mm, and LVEDVi 56 mL/m2. Mild asymmetric septal hypertrophy, with intraventricular septal thickness of 12 mm, posterior wall thickness of 6 mm, and myocardial mass index of 42 g/m2. Normal left ventricular systolic function (LVEF =67%). There is chordal-systolic anterior motion of the anterior mitral valve leaflet. Peak gradient < 10 mm Hg. Left ventricular parametric mapping notable for normal T2 and ECV signal. There is no late gadolinium enhancement in the left ventricular myocardium. Six standard deviation used. 2. Normal right ventricular size with RVEDVI 61 mL/m2. Normal right ventricular thickness. Normal right ventricular systolic function (RVEF =59%). There are no regional wall motion abnormalities or aneurysms. 3.  Normal left and right atrial size. 4. Normal size of the aortic root, ascending aorta and pulmonary artery. 5. Valve assessment: Aortic Valve: Tri-leaflet aortic valve. There is no significant regurgitation. Regurgitant fraction 1% Pulmonic Valve: There is no significant regurgitation. Regurgitant fraction 2% Tricuspid Valve: There is no significant regurgitation. Regurgitant fraction 3% Mitral Valve: Anterior mitral valve billowing without true prolapse. There is mild regurgitation. Regurgitant fraction 10% 6.  Normal pericardium.  No pericardial effusion. 7. Grossly, no extracardiac findings. Recommended dedicated study if concerned for non-cardiac pathology. IMPRESSION: Study does not meet criteria for hypertrophic cardiomyopathy. Suspect mild LVOT gradient related to hyperdynamic LV function. Riley Lam MD Electronically Signed   By: Riley Lam M.D.   On: 01/11/2022 15:19    Assessment & Plan:   Problem List Items Addressed This Visit     Depression with anxiety    Continue on citalopram Evelyn Murphy is exercising at home 7 days a week, swimming 3 days a week, walking 12,000 steps a day.       Well adult exam   GERD  (gastroesophageal reflux disease)    Continue on Protonix      Anemia   Relevant Orders   TSH   Urinalysis   CBC with Differential/Platelet   Lipid panel   Comprehensive metabolic panel   Hemoglobin A1c   Dyslipidemia    On Crestor + Zetia. LDL at goal.      Hypercholesterolemia   Relevant Orders   TSH   Urinalysis   CBC with Differential/Platelet   Lipid panel  Comprehensive metabolic panel   Hemoglobin A1c   Diabetes mellitus type 2 in nonobese (HCC) - Primary    On Metformin po.  Hemoglobin A1c is better Continue on citalopram Evelyn Murphy is exercising at home 7 days a week, swimming 3 days a week, walking 12,000 steps a day.       Relevant Orders   TSH   Urinalysis   CBC with Differential/Platelet   Lipid panel   Comprehensive metabolic panel   Hemoglobin A1c      No orders of the defined types were placed in this encounter.     Follow-up: Return in about 6 months (around 03/04/2023) for a follow-up visit.  Sonda Primes, MD

## 2022-09-02 NOTE — Assessment & Plan Note (Addendum)
On Metformin po.  Hemoglobin A1c is better Continue on citalopram Evelyn Murphy is exercising at home 7 days a week, swimming 3 days a week, walking 12,000 steps a day.

## 2022-09-15 DIAGNOSIS — L820 Inflamed seborrheic keratosis: Secondary | ICD-10-CM | POA: Diagnosis not present

## 2022-09-15 DIAGNOSIS — L814 Other melanin hyperpigmentation: Secondary | ICD-10-CM | POA: Diagnosis not present

## 2022-10-11 ENCOUNTER — Other Ambulatory Visit: Payer: Self-pay | Admitting: Internal Medicine

## 2022-10-14 DIAGNOSIS — Z01419 Encounter for gynecological examination (general) (routine) without abnormal findings: Secondary | ICD-10-CM | POA: Diagnosis not present

## 2022-10-14 DIAGNOSIS — Z1231 Encounter for screening mammogram for malignant neoplasm of breast: Secondary | ICD-10-CM | POA: Diagnosis not present

## 2022-10-14 DIAGNOSIS — R8290 Unspecified abnormal findings in urine: Secondary | ICD-10-CM | POA: Diagnosis not present

## 2022-11-09 ENCOUNTER — Encounter: Payer: Self-pay | Admitting: Internal Medicine

## 2022-11-09 ENCOUNTER — Ambulatory Visit: Payer: Medicare Other | Attending: Internal Medicine | Admitting: Internal Medicine

## 2022-11-09 VITALS — BP 106/74 | HR 76 | Ht 62.0 in | Wt 135.0 lb

## 2022-11-09 DIAGNOSIS — Z7984 Long term (current) use of oral hypoglycemic drugs: Secondary | ICD-10-CM | POA: Diagnosis not present

## 2022-11-09 DIAGNOSIS — I779 Disorder of arteries and arterioles, unspecified: Secondary | ICD-10-CM | POA: Insufficient documentation

## 2022-11-09 DIAGNOSIS — E119 Type 2 diabetes mellitus without complications: Secondary | ICD-10-CM | POA: Insufficient documentation

## 2022-11-09 DIAGNOSIS — E7800 Pure hypercholesterolemia, unspecified: Secondary | ICD-10-CM | POA: Insufficient documentation

## 2022-11-09 DIAGNOSIS — I4510 Unspecified right bundle-branch block: Secondary | ICD-10-CM | POA: Diagnosis not present
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
## 2022-11-09 NOTE — Patient Instructions (Signed)
Medication Instructions:  Your physician recommends that you continue on your current medications as directed. Please refer to the Current Medication list given to you today.  *If you need a refill on your cardiac medications before your next appointment, please call your pharmacy*   Lab Work: NONE If you have labs (blood work) drawn today and your tests are completely normal, you will receive your results only by: [place] (if you have MyChart) OR A paper copy in the mail If you have any lab test that is abnormal or we need to change your treatment, we will call you to review the results.   Testing/Procedures: Your physician has requested that you have a carotid duplex. This test is an ultrasound of the carotid arteries in your neck. It looks at blood flow through these arteries that supply the brain with blood. Allow one hour for this exam. There are no restrictions or special instructions.    Follow-Up: At [facility], you and your health needs are our priority.  As part of our continuing mission to provide you with exceptional heart care, we have created designated Provider Care Teams.  These Care Teams include your primary Cardiologist (physician) and Advanced Practice Providers (APPs -  Physician Assistants and Nurse Practitioners) who all work together to provide you with the care you need, when you need it.   Your next appointment:   1 year(s)  Provider:   Rudean Haskell, MD

## 2022-11-18 ENCOUNTER — Ambulatory Visit (HOSPITAL_COMMUNITY): Payer: Medicare Other

## 2022-11-24 ENCOUNTER — Encounter (HOSPITAL_COMMUNITY): Payer: Medicare Other

## 2022-11-24 ENCOUNTER — Other Ambulatory Visit: Payer: Self-pay | Admitting: Internal Medicine

## 2022-11-24 DIAGNOSIS — Z23 Encounter for immunization: Secondary | ICD-10-CM | POA: Diagnosis not present

## 2022-11-25 ENCOUNTER — Ambulatory Visit (HOSPITAL_COMMUNITY)
Admission: RE | Admit: 2022-11-25 | Discharge: 2022-11-25 | Disposition: A | Discharge status: Home / Self Care | Payer: Medicare Other | Source: Ambulatory Visit | Attending: Internal Medicine | Admitting: Internal Medicine

## 2022-11-25 DIAGNOSIS — I6523 Occlusion and stenosis of bilateral carotid arteries: Secondary | ICD-10-CM | POA: Diagnosis not present

## 2022-11-25 DIAGNOSIS — I779 Disorder of arteries and arterioles, unspecified: Secondary | ICD-10-CM | POA: Insufficient documentation

## 2022-12-09 ENCOUNTER — Telehealth: Payer: Self-pay | Admitting: Internal Medicine

## 2022-12-09 NOTE — Telephone Encounter (Signed)
Pt stated she was called today regarding her VAS US CAROTID results and she missed it so she was returning the call and is now requesting a callback. Please advise

## 2022-12-09 NOTE — Telephone Encounter (Signed)
Left a detailed message with results of Carotid US.  Advised pt to call in with questions or concerns.

## 2022-12-14 ENCOUNTER — Other Ambulatory Visit: Payer: Self-pay | Admitting: Internal Medicine

## 2023-02-02 ENCOUNTER — Encounter: Payer: Self-pay | Admitting: Internal Medicine

## 2023-02-02 NOTE — Telephone Encounter (Signed)
Care team updated and letter sent for eye exam notes.

## 2023-02-25 ENCOUNTER — Encounter: Payer: Self-pay | Admitting: Internal Medicine

## 2023-02-26 ENCOUNTER — Other Ambulatory Visit: Payer: Self-pay | Admitting: Internal Medicine

## 2023-02-26 DIAGNOSIS — R350 Frequency of micturition: Secondary | ICD-10-CM

## 2023-02-28 ENCOUNTER — Other Ambulatory Visit (INDEPENDENT_AMBULATORY_CARE_PROVIDER_SITE_OTHER): Payer: Medicare Other

## 2023-02-28 DIAGNOSIS — D649 Anemia, unspecified: Secondary | ICD-10-CM

## 2023-02-28 DIAGNOSIS — E7800 Pure hypercholesterolemia, unspecified: Secondary | ICD-10-CM

## 2023-02-28 DIAGNOSIS — E119 Type 2 diabetes mellitus without complications: Secondary | ICD-10-CM

## 2023-02-28 DIAGNOSIS — R350 Frequency of micturition: Secondary | ICD-10-CM | POA: Diagnosis not present

## 2023-02-28 LAB — COMPREHENSIVE METABOLIC PANEL
ALT: 28 U/L (ref 0–35)
AST: 29 U/L (ref 0–37)
Albumin: 4.5 g/dL (ref 3.5–5.2)
Alkaline Phosphatase: 86 U/L (ref 39–117)
BUN: 22 mg/dL (ref 6–23)
CO2: 28 meq/L (ref 19–32)
Calcium: 9.9 mg/dL (ref 8.4–10.5)
Chloride: 103 meq/L (ref 96–112)
Creatinine, Ser: 0.83 mg/dL (ref 0.40–1.20)
GFR: 68.91 mL/min (ref >60.00)
Glucose, Bld: 114 mg/dL — ABNORMAL HIGH (ref 70–99)
Potassium: 4.3 meq/L (ref 3.5–5.1)
Sodium: 142 meq/L (ref 135–145)
Total Bilirubin: 0.4 mg/dL (ref 0.2–1.2)
Total Protein: 7.2 g/dL (ref 6.0–8.3)

## 2023-02-28 LAB — URINALYSIS, ROUTINE W REFLEX MICROSCOPIC
Bilirubin Urine: NEGATIVE
Hgb urine dipstick: NEGATIVE
Ketones, ur: NEGATIVE
Nitrite: NEGATIVE
RBC / HPF: NONE SEEN (ref 0–?)
Specific Gravity, Urine: 1.025 (ref 1.000–1.030)
Total Protein, Urine: NEGATIVE
Urine Glucose: NEGATIVE
Urobilinogen, UA: 0.2 (ref 0.0–1.0)
pH: 6.5 (ref 5.0–8.0)

## 2023-02-28 LAB — LIPID PANEL
Cholesterol: 152 mg/dL (ref 0–200)
HDL: 71.4 mg/dL (ref >39.00)
LDL Cholesterol: 69 mg/dL (ref 0–99)
NonHDL: 80.38
Total CHOL/HDL Ratio: 2
Triglycerides: 56 mg/dL (ref 0.0–149.0)
VLDL: 11.2 mg/dL (ref 0.0–40.0)

## 2023-02-28 LAB — CBC WITH DIFFERENTIAL/PLATELET
Basophils Absolute: 0 10*3/uL (ref 0.0–0.1)
Basophils Relative: 0.7 % (ref 0.0–3.0)
Eosinophils Absolute: 0.1 10*3/uL (ref 0.0–0.7)
Eosinophils Relative: 3.1 % (ref 0.0–5.0)
HCT: 38.7 % (ref 36.0–46.0)
Hemoglobin: 12.7 g/dL (ref 12.0–15.0)
Lymphocytes Relative: 31.2 % (ref 12.0–46.0)
Lymphs Abs: 1.5 10*3/uL (ref 0.7–4.0)
MCHC: 32.9 g/dL (ref 30.0–36.0)
MCV: 87.9 fL (ref 78.0–100.0)
Monocytes Absolute: 0.4 10*3/uL (ref 0.1–1.0)
Monocytes Relative: 9.4 % (ref 3.0–12.0)
Neutro Abs: 2.6 10*3/uL (ref 1.4–7.7)
Neutrophils Relative %: 55.6 % (ref 43.0–77.0)
Platelets: 297 10*3/uL (ref 150.0–400.0)
RBC: 4.4 Mil/uL (ref 3.87–5.11)
RDW: 13.1 % (ref 11.5–15.5)
WBC: 4.7 10*3/uL (ref 4.0–10.5)

## 2023-02-28 LAB — HEMOGLOBIN A1C: Hgb A1c MFr Bld: 6.7 % — ABNORMAL HIGH (ref 4.6–6.5)

## 2023-02-28 LAB — TSH: TSH: 3.55 u[IU]/mL (ref 0.35–5.50)

## 2023-03-01 DIAGNOSIS — H25013 Cortical age-related cataract, bilateral: Secondary | ICD-10-CM | POA: Diagnosis not present

## 2023-03-03 ENCOUNTER — Ambulatory Visit: Payer: Medicare Other | Admitting: Internal Medicine

## 2023-03-17 ENCOUNTER — Encounter: Payer: Self-pay | Admitting: Internal Medicine

## 2023-03-17 ENCOUNTER — Ambulatory Visit (INDEPENDENT_AMBULATORY_CARE_PROVIDER_SITE_OTHER): Payer: Medicare Other | Admitting: Internal Medicine

## 2023-03-17 VITALS — BP 118/70 | HR 69 | Temp 98.8°F | Ht 62.0 in

## 2023-03-17 DIAGNOSIS — F418 Other specified anxiety disorders: Secondary | ICD-10-CM

## 2023-03-17 DIAGNOSIS — J0100 Acute maxillary sinusitis, unspecified: Secondary | ICD-10-CM | POA: Diagnosis not present

## 2023-03-17 DIAGNOSIS — M7061 Trochanteric bursitis, right hip: Secondary | ICD-10-CM | POA: Diagnosis not present

## 2023-03-17 MED ORDER — METHYLPREDNISOLONE ACETATE 80 MG/ML IJ SUSP
80.0000 mg | Freq: Once | INTRAMUSCULAR | Status: AC
  Administered 2023-03-17: 80 mg via INTRAMUSCULAR

## 2023-03-17 MED ORDER — ARNUITY ELLIPTA 100 MCG/ACT IN AEPB: 1.0000 | INHALATION_SPRAY | Freq: Every day | RESPIRATORY_TRACT | 1 refills | Status: AC

## 2023-03-17 MED ORDER — CEFDINIR 300 MG PO CAPS: 300.0000 mg | ORAL_CAPSULE | Freq: Two times a day (BID) | ORAL | 0 refills | Status: DC

## 2023-03-17 MED ORDER — LINACLOTIDE 72 MCG PO CAPS: 72.0000 ug | ORAL_CAPSULE | Freq: Every day | ORAL | 3 refills | Status: AC

## 2023-03-17 NOTE — Progress Notes (Signed)
Subjective:  Patient ID: Evelyn Murphy, female    DOB: 09-Jan-1946  Age: 77 y.o. MRN: [id number]  CC: Medical Management of Chronic Issues (6 mnth f/u)   HPI Evelyn Murphy presents for sinus pain.  Worse with bending over.  White nasal discharge around 2 weeks duration C/o R lateral hip pain, severe of several weeks duration. Follow-up on hypertension, dyslipidemia  Outpatient Medications Prior to Visit  Medication Sig Dispense Refill   aspirin EC 81 MG tablet Take 1 tablet (81 mg total) by mouth daily. 30 tablet 0   citalopram (CELEXA) 20 MG tablet TAKE 1 TABLET BY MOUTH EVERY DAY 90 tablet 1   doxepin (SINEQUAN) 75 MG capsule TAKE 1 TO 2 CAPSULES BY MOUTH AT BEDTIME 180 capsule 1   ezetimibe (ZETIA) 10 MG tablet Take 1 tablet (10 mg total) by mouth daily. 90 tablet 3   metFORMIN (GLUCOPHAGE) 500 MG tablet TAKE 1 TABLET BY MOUTH EVERY DAY WITH BREAKFAST 90 tablet 3   Multiple Vitamins-Minerals (MULTIVITAMIN WITH MINERALS) tablet Take 1 tablet by mouth daily.      rosuvastatin (CRESTOR) 10 MG tablet TAKE 1 TABLET BY MOUTH EVERY DAY 90 tablet 3   UNABLE TO FIND Med Name: Valerian Root 2 per day     No facility-administered medications prior to visit.    ROS: Review of Systems  Constitutional:  Negative for activity change, appetite change, chills, fatigue and unexpected weight change.  HENT:  Negative for congestion, mouth sores and sinus pressure.   Eyes:  Negative for visual disturbance.  Respiratory:  Negative for cough and chest tightness.   Gastrointestinal:  Negative for abdominal pain and nausea.  Genitourinary:  Negative for difficulty urinating, frequency and vaginal pain.  Musculoskeletal:  Positive for arthralgias. Negative for back pain and gait problem.  Skin:  Negative for pallor and rash.  Neurological:  Negative for dizziness, tremors, weakness, numbness and headaches.  Hematological:  Does not bruise/bleed easily.  Psychiatric/Behavioral:  Positive for sleep  disturbance. Negative for confusion and self-injury.     Objective:  BP 118/70 (BP Location: Left Arm, Patient Position: Sitting, Cuff Size: Normal)   Pulse 69   Temp 98.8 F (37.1 C) (Oral)   Ht 5\' 2"  (1.575 m)   SpO2 97%   BMI 24.69 kg/m   BP Readings from Last 3 Encounters:  03/17/23 118/70  11/09/22 106/74  09/02/22 112/70    Wt Readings from Last 3 Encounters:  11/09/22 135 lb (61.2 kg)  03/01/22 135 lb (61.2 kg)  10/23/21 135 lb (61.2 kg)    Physical Exam Constitutional:      General: She is not in acute distress.    Appearance: She is well-developed.  HENT:     Head: Normocephalic.     Right Ear: External ear normal.     Left Ear: External ear normal.     Nose: Nose normal.  Eyes:     General:        Right eye: No discharge.        Left eye: No discharge.     Conjunctiva/sclera: Conjunctivae normal.     Pupils: Pupils are equal, round, and reactive to light.  Neck:     Thyroid: No thyromegaly.     Vascular: No JVD.     Trachea: No tracheal deviation.  Cardiovascular:     Rate and Rhythm: Normal rate and regular rhythm.     Heart sounds: Normal heart sounds.  Pulmonary:  Effort: No respiratory distress.     Breath sounds: No stridor. No wheezing.  Abdominal:     General: Bowel sounds are normal. There is no distension.     Palpations: Abdomen is soft. There is no mass.     Tenderness: There is no abdominal tenderness. There is no guarding or rebound.  Musculoskeletal:        General: No tenderness.     Cervical back: Normal range of motion and neck supple. No rigidity.  Lymphadenopathy:     Cervical: No cervical adenopathy.  Skin:    Findings: No erythema or rash.  Neurological:     Cranial Nerves: No cranial nerve deficit.     Motor: No abnormal muscle tone.     Coordination: Coordination normal.     Deep Tendon Reflexes: Reflexes normal.  Psychiatric:        Behavior: Behavior normal.        Thought Content: Thought content normal.         Judgment: Judgment normal.      R hip in the area of the trochanter major w/ substantial pain on palpation Straight leg elevation is negative.  Hip with full range of motion   Procedure Note :     Procedure : Joint Injection,  R  hip   Indication:  Trochanteric bursitis with refractory  chronic pain.   Risks including unsuccessful procedure , bleeding, infection, bruising, skin atrophy, "steroid flare-up" and others were explained to the patient in detail as well as the benefits. Informed consent was obtained verbally.  Tthe patient was placed in a comfortable lateral decubitus position. The point of maximal tenderness was identified. Skin was prepped with Betadine and alcohol. Then, a 5 cc syringe with a 2 inch long 24-gauge needle was used for a bursa injection.. The needle was advanced  Into the bursa. I injected the bursa with 4 mL of 2% lidocaine and 40 mg of Depo-Medrol .  Band-Aid was applied.   Tolerated well. Complications: None. Good pain relief following the procedure.   Postprocedure instructions :    A Band-Aid should be left on for 12 hours. Injection therapy is not a cure itself. It is used in conjunction with other modalities. You can use nonsteroidal anti-inflammatories like ibuprofen , hot and cold compresses. Rest is recommended in the next 24 hours. You need to report immediately  if fever, chills or any signs of infection develop.   Lab Results  Component Value Date   WBC 4.7 02/28/2023   HGB 12.7 02/28/2023   HCT 38.7 02/28/2023   PLT 297.0 02/28/2023   GLUCOSE 114 (H) 02/28/2023   CHOL 152 02/28/2023   TRIG 56.0 02/28/2023   HDL 71.40 02/28/2023   LDLDIRECT 57.0 08/30/2022   LDLCALC 69 02/28/2023   ALT 28 02/28/2023   AST 29 02/28/2023   NA 142 02/28/2023   K 4.3 02/28/2023   CL 103 02/28/2023   CREATININE 0.83 02/28/2023   BUN 22 02/28/2023   CO2 28 02/28/2023   TSH 3.55 02/28/2023   INR 0.96 06/29/2014   HGBA1C 6.7 (H) 02/28/2023   MICROALBUR  <0.7 03/05/2020    VAS US CAROTID Result Date: 11/25/2022 Carotid Arterial Duplex Study Patient Name:  Evelyn Murphy  Date of Exam:   11/25/2022 Medical Rec #: [id number]       Accession #:    [id number] Date of Birth: 01/21/1946        Patient Gender: F Patient Age:   77  years Exam Location:  [facility] Procedure:      VAS US CAROTID Referring Phys: [facility] CHANDRASEKHAR --------------------------------------------------------------------------------  Indications:  Mild carotid artery disease follow-up. Patient denies any               cerebrovascular symptoms. Risk Factors: Hyperlipidemia, Diabetes, no history of smoking. Limitations   Today's exam was limited due to the high bifurcation of the               carotid. Performing Technologist: Olegario Shearer RVT  Examination Guidelines: A complete evaluation includes B-mode imaging, spectral Doppler, color Doppler, and power Doppler as needed of all accessible portions of each vessel. Bilateral testing is considered an integral part of a complete examination. Limited examinations for reoccurring indications may be performed as noted.  Right Carotid Findings: +----------+--------+--------+--------+------------------+--------------+           PSV cm/sEDV cm/sStenosisPlaque DescriptionComments       +----------+--------+--------+--------+------------------+--------------+ CCA Prox  56      11                                               +----------+--------+--------+--------+------------------+--------------+ CCA Distal82      29                                               +----------+--------+--------+--------+------------------+--------------+ ICA Prox  70      26      1-39%   smooth            minimal plaque +----------+--------+--------+--------+------------------+--------------+ ICA Mid   68      30                                tortuous       +----------+--------+--------+--------+------------------+--------------+ ICA  Distal95      37                                               +----------+--------+--------+--------+------------------+--------------+ ECA       65      10                                               +----------+--------+--------+--------+------------------+--------------+ +----------+--------+-------+----------------+-------------------+           PSV cm/sEDV cmsDescribe        Arm Pressure (mmHG) +----------+--------+-------+----------------+-------------------+ Subclavian110            Multiphasic, WNL                    +----------+--------+-------+----------------+-------------------+ +---------+--------+--+--------+--+---------+ VertebralPSV cm/s40EDV cm/s12Antegrade +---------+--------+--+--------+--+---------+  Left Carotid Findings: +----------+--------+--------+--------+------------------+--------------+           PSV cm/sEDV cm/sStenosisPlaque DescriptionComments       +----------+--------+--------+--------+------------------+--------------+ CCA Prox  103     23                                               +----------+--------+--------+--------+------------------+--------------+  CCA Distal88      29                                               +----------+--------+--------+--------+------------------+--------------+ ICA Prox  50      14              smooth            minimal plaque +----------+--------+--------+--------+------------------+--------------+ ICA Mid   74      40      1-39%                                    +----------+--------+--------+--------+------------------+--------------+ ICA Distal78      34                                               +----------+--------+--------+--------+------------------+--------------+ ECA       53      13                                               +----------+--------+--------+--------+------------------+--------------+  +----------+--------+--------+----------------+-------------------+           PSV cm/sEDV cm/sDescribe        Arm Pressure (mmHG) +----------+--------+--------+----------------+-------------------+ [id number]              Multiphasic, WNL                    +----------+--------+--------+----------------+-------------------+ +---------+--------+--+--------+--+---------+ VertebralPSV cm/s67EDV cm/s26Antegrade +---------+--------+--+--------+--+---------+   Summary: Right Carotid: Velocities in the right ICA are consistent with a 1-39% stenosis.                Minimal plaque noted. Left Carotid: Velocities in the left ICA are consistent with a 1-39% stenosis.               Minimal plaque noted. Vertebrals:  Bilateral vertebral arteries demonstrate antegrade flow. Subclavians: Normal flow hemodynamics were seen in bilateral subclavian              arteries. *See table(s) above for measurements and observations.  Electronically signed by Nanetta Batty MD on 11/25/2022 at 1:12:58 PM.    Final     Assessment & Plan:   Problem List Items Addressed This Visit     Depression with anxiety   Continue on citalopram Evelyn Murphy is exercising at home 7 days a week, swimming 3 days a week, walking 12,000 steps a day.       Trochanteric bursitis of right hip - Primary   Hip opener exercises Blue-Emu cream was recommended to use 2-3 times a day Options discussed - will inject with steroids today.  See procedure      Acute sinusitis   Omnicef prescribed.  NyQuil/DayQuil as needed      Relevant Medications   cefdinir (OMNICEF) 300 MG capsule      Meds ordered this encounter  Medications   Fluticasone Furoate (ARNUITY ELLIPTA) 100 MCG/ACT AEPB    Sig: Inhale 1 puff into the lungs daily.    Dispense:  30 each  Refill:  1   cefdinir (OMNICEF) 300 MG capsule    Sig: Take 1 capsule (300 mg total) by mouth 2 (two) times daily.    Dispense:  20 capsule    Refill:  0   linaclotide (LINZESS)  72 MCG capsule    Sig: Take 1 capsule (72 mcg total) by mouth daily before breakfast.    Dispense:  90 capsule    Refill:  3   methylPREDNISolone acetate (DEPO-MEDROL) injection 80 mg      Follow-up: Return in about 6 months (around 09/15/2023) for a follow-up visit.  Sonda Primes, MD

## 2023-03-20 DIAGNOSIS — J019 Acute sinusitis, unspecified: Secondary | ICD-10-CM | POA: Insufficient documentation

## 2023-03-20 NOTE — Assessment & Plan Note (Signed)
Continue on citalopram Teliah is exercising at home 7 days a week, swimming 3 days a week, walking 12,000 steps a day.

## 2023-03-20 NOTE — Assessment & Plan Note (Signed)
Hip opener exercises Blue-Emu cream was recommended to use 2-3 times a day Options discussed - will inject with steroids today.  See procedure

## 2023-03-20 NOTE — Assessment & Plan Note (Signed)
Omnicef prescribed.  NyQuil/DayQuil as needed

## 2023-03-28 DIAGNOSIS — H25813 Combined forms of age-related cataract, bilateral: Secondary | ICD-10-CM | POA: Diagnosis not present

## 2023-04-12 ENCOUNTER — Other Ambulatory Visit: Payer: Self-pay | Admitting: Internal Medicine

## 2023-04-28 DIAGNOSIS — H25812 Combined forms of age-related cataract, left eye: Secondary | ICD-10-CM | POA: Diagnosis not present

## 2023-04-28 DIAGNOSIS — F32A Depression, unspecified: Secondary | ICD-10-CM | POA: Diagnosis not present

## 2023-04-28 DIAGNOSIS — E1136 Type 2 diabetes mellitus with diabetic cataract: Secondary | ICD-10-CM | POA: Diagnosis not present

## 2023-05-26 DIAGNOSIS — H25811 Combined forms of age-related cataract, right eye: Secondary | ICD-10-CM | POA: Diagnosis not present

## 2023-05-26 DIAGNOSIS — E1136 Type 2 diabetes mellitus with diabetic cataract: Secondary | ICD-10-CM | POA: Diagnosis not present

## 2023-05-26 DIAGNOSIS — F418 Other specified anxiety disorders: Secondary | ICD-10-CM | POA: Diagnosis not present

## 2023-06-30 ENCOUNTER — Encounter: Payer: Self-pay | Admitting: Internal Medicine

## 2023-07-04 ENCOUNTER — Other Ambulatory Visit: Payer: Self-pay | Admitting: Family

## 2023-07-04 MED ORDER — DOXEPIN HCL 75 MG PO CAPS: 75.0000 mg | ORAL_CAPSULE | Freq: Every day | ORAL | 1 refills | Status: DC

## 2023-07-18 DIAGNOSIS — H00024 Hordeolum internum left upper eyelid: Secondary | ICD-10-CM | POA: Diagnosis not present

## 2023-08-16 ENCOUNTER — Other Ambulatory Visit: Payer: Self-pay | Admitting: Internal Medicine

## 2023-08-17 ENCOUNTER — Ambulatory Visit (INDEPENDENT_AMBULATORY_CARE_PROVIDER_SITE_OTHER): Admitting: Internal Medicine

## 2023-08-17 ENCOUNTER — Encounter: Payer: Self-pay | Admitting: Internal Medicine

## 2023-08-17 VITALS — BP 114/70 | HR 83 | Temp 98.6°F | Ht 62.0 in

## 2023-08-17 DIAGNOSIS — M25551 Pain in right hip: Secondary | ICD-10-CM | POA: Diagnosis not present

## 2023-08-17 DIAGNOSIS — G8929 Other chronic pain: Secondary | ICD-10-CM

## 2023-08-17 DIAGNOSIS — E119 Type 2 diabetes mellitus without complications: Secondary | ICD-10-CM

## 2023-08-17 MED ORDER — METHYLPREDNISOLONE ACETATE 80 MG/ML IJ SUSP
80.0000 mg | Freq: Once | INTRAMUSCULAR | Status: AC
  Administered 2023-08-17: 80 mg via INTRAMUSCULAR

## 2023-08-17 NOTE — Progress Notes (Unsigned)
 Subjective:  Patient ID: Evelyn Murphy, female    DOB: [birth year]  Age: 78 y.o. MRN: [id number]  CC: Hip Pain (Rt hip pain, pt is requesting an injection to help with the pain as she will be traveling along distance,)   HPI [name] presents for R hip painrelapse. Shot helped last December. Pt wants to repeat the shot. Going on a cruise  Outpatient Medications Prior to Visit  Medication Sig Dispense Refill   aspirin  EC 81 MG tablet Take 1 tablet (81 mg total) by mouth daily. 30 tablet 0   citalopram  (CELEXA ) 20 MG tablet TAKE 1 TABLET BY MOUTH EVERY DAY 90 tablet 1   doxepin  (SINEQUAN ) 75 MG capsule Take 1 capsule (75 mg total) by mouth at bedtime. 180 capsule 1   ezetimibe  (ZETIA ) 10 MG tablet TAKE 1 TABLET BY MOUTH EVERY DAY 90 tablet 3   Fluticasone  Furoate (ARNUITY ELLIPTA ) 100 MCG/ACT AEPB Inhale 1 puff into the lungs daily. 30 each 1   linaclotide  (LINZESS ) 72 MCG capsule Take 1 capsule (72 mcg total) by mouth daily before breakfast. 90 capsule 3   metFORMIN  (GLUCOPHAGE ) 500 MG tablet TAKE 1 TABLET BY MOUTH EVERY DAY WITH BREAKFAST 90 tablet 3   Multiple Vitamins-Minerals (MULTIVITAMIN WITH MINERALS) tablet Take 1 tablet by mouth daily.      neomycin-polymyxin b-dexamethasone  (MAXITROL) 3.5-10000-0.1 SUSP 1 drop every 4 (four) hours.     prednisoLONE acetate (PRED FORTE) 1 % ophthalmic suspension 1 drop 3 (three) times daily.     rosuvastatin  (CRESTOR ) 10 MG tablet TAKE 1 TABLET BY MOUTH EVERY DAY 90 tablet 3   UNABLE TO FIND Med Name: Valerian Root 2 per day     cefdinir  (OMNICEF ) 300 MG capsule Take 1 capsule (300 mg total) by mouth 2 (two) times daily. (Patient not taking: Reported on 08/17/2023) 20 capsule 0   No facility-administered medications prior to visit.    ROS: Review of Systems  Constitutional:  Negative for fever.  Musculoskeletal:  Positive for arthralgias and gait problem. Negative for back pain.    Objective:  BP 114/70   Pulse 83   Temp 98.6 F (37  C) (Oral)   Ht 5\' 2"  (1.575 m)   SpO2 93%   BMI 24.69 kg/m   BP Readings from Last 3 Encounters:  08/17/23 114/70  03/17/23 118/70  11/09/22 106/74    Wt Readings from Last 3 Encounters:  11/09/22 135 lb (61.2 kg)  03/01/22 135 lb (61.2 kg)  10/23/21 135 lb (61.2 kg)    Physical Exam Constitutional:      Appearance: Normal appearance.  Musculoskeletal:        General: Tenderness present. No swelling or deformity.     Right lower leg: No edema.     Left lower leg: No edema.   R lat hip w/pain   Procedure Note :     Procedure : Joint Injection,   R hip   Indication:  Trochanteric bursitis with refractory  chronic pain.   Risks including unsuccessful procedure , bleeding, infection, bruising, skin atrophy, "steroid flare-up" and others were explained to the patient in detail as well as the benefits. Informed consent was obtained verbally.  Tthe patient was placed in a comfortable lateral decubitus position. The point of maximal tenderness was identified. Skin was prepped with Betadine and alcohol. Then, a 5 cc syringe with a 2 inch long 24-gauge needle was used for a bursa injection.. The needle was advanced  Into the  bursa. I injected the bursa with 4 mL of 2% lidocaine  and 80 mg of Depo-Medrol  .  Band-Aid was applied.   Tolerated well. Complications: None. Good pain relief following the procedure.   Postprocedure instructions :    A Band-Aid should be left on for 12 hours. Injection therapy is not a cure itself. It is used in conjunction with other modalities. You can use nonsteroidal anti-inflammatories like ibuprofen , hot and cold compresses. Rest is recommended in the next 24 hours. You need to report immediately  if fever, chills or any signs of infection develop.   Lab Results  Component Value Date   WBC 4.7 02/28/2023   HGB 12.7 02/28/2023   HCT 38.7 02/28/2023   PLT 297.0 02/28/2023   GLUCOSE 114 (H) 02/28/2023   CHOL 152 02/28/2023   TRIG 56.0 02/28/2023    HDL 71.40 02/28/2023   LDLDIRECT 57.0 08/30/2022   LDLCALC 69 02/28/2023   ALT 28 02/28/2023   AST 29 02/28/2023   NA 142 02/28/2023   K 4.3 02/28/2023   CL 103 02/28/2023   CREATININE 0.83 02/28/2023   BUN 22 02/28/2023   CO2 28 02/28/2023   TSH 3.55 02/28/2023   INR 0.96 06/29/2014   HGBA1C 6.7 (H) 02/28/2023   MICROALBUR <0.7 03/05/2020    VAS US  CAROTID Result Date: 11/25/2022 Carotid Arterial Duplex Study Patient Name:  Evelyn Murphy  Date of Exam:   11/25/2022 Medical Rec #: [id number]       Accession #:    [id number] Date of Birth: 02-Jan-1946        Patient Gender: F Patient Age:   56 years Exam Location:  [facility] Procedure:      VAS US  CAROTID Referring Phys: [facility] --------------------------------------------------------------------------------  Indications:  Mild carotid artery disease follow-up. Patient denies any               cerebrovascular symptoms. Risk Factors: Hyperlipidemia, Diabetes, no history of smoking. Limitations   Today's exam was limited due to the high bifurcation of the               carotid. Performing Technologist: Harless Lien RVT  Examination Guidelines: A complete evaluation includes B-mode imaging, spectral Doppler, color Doppler, and power Doppler as needed of all accessible portions of each vessel. Bilateral testing is considered an integral part of a complete examination. Limited examinations for reoccurring indications may be performed as noted.  Right Carotid Findings: +----------+--------+--------+--------+------------------+--------------+           PSV cm/sEDV cm/sStenosisPlaque DescriptionComments       +----------+--------+--------+--------+------------------+--------------+ CCA Prox  56      11                                               +----------+--------+--------+--------+------------------+--------------+ CCA Distal82      29                                                +----------+--------+--------+--------+------------------+--------------+ ICA Prox  70      26      1-39%   smooth            minimal plaque +----------+--------+--------+--------+------------------+--------------+ ICA Mid   68      30  tortuous       +----------+--------+--------+--------+------------------+--------------+ ICA Distal95      37                                               +----------+--------+--------+--------+------------------+--------------+ ECA       65      10                                               +----------+--------+--------+--------+------------------+--------------+ +----------+--------+-------+----------------+-------------------+           PSV cm/sEDV cmsDescribe        Arm Pressure (mmHG) +----------+--------+-------+----------------+-------------------+ Subclavian110            Multiphasic, WNL                    +----------+--------+-------+----------------+-------------------+ +---------+--------+--+--------+--+---------+ VertebralPSV cm/s40EDV cm/s12Antegrade +---------+--------+--+--------+--+---------+  Left Carotid Findings: +----------+--------+--------+--------+------------------+--------------+           PSV cm/sEDV cm/sStenosisPlaque DescriptionComments       +----------+--------+--------+--------+------------------+--------------+ CCA Prox  103     23                                               +----------+--------+--------+--------+------------------+--------------+ CCA Distal88      29                                               +----------+--------+--------+--------+------------------+--------------+ ICA Prox  50      14              smooth            minimal plaque +----------+--------+--------+--------+------------------+--------------+ ICA Mid   74      40      1-39%                                     +----------+--------+--------+--------+------------------+--------------+ ICA Distal78      34                                               +----------+--------+--------+--------+------------------+--------------+ ECA       53      13                                               +----------+--------+--------+--------+------------------+--------------+ +----------+--------+--------+----------------+-------------------+           PSV cm/sEDV cm/sDescribe        Arm Pressure (mmHG) +----------+--------+--------+----------------+-------------------+ [id number]              Multiphasic, WNL                    +----------+--------+--------+----------------+-------------------+ +---------+--------+--+--------+--+---------+ VertebralPSV cm/s67EDV cm/s26Antegrade +---------+--------+--+--------+--+---------+  Summary: Right Carotid: Velocities in the right ICA are consistent with a 1-39% stenosis.                Minimal plaque noted. Left Carotid: Velocities in the left ICA are consistent with a 1-39% stenosis.               Minimal plaque noted. Vertebrals:  Bilateral vertebral arteries demonstrate antegrade flow. Subclavians: Normal flow hemodynamics were seen in bilateral subclavian              arteries. *See table(s) above for measurements and observations.  Electronically signed by Lauro Portal MD on 11/25/2022 at 1:12:58 PM.    Final     Assessment & Plan:   Problem List Items Addressed This Visit     Hip pain, chronic, right - Primary   R recurrent trochanteric bursitis Hip opener exercises Blue-Emu cream was recommended to use 2-3 times a day Injected - see procedure      Diabetes mellitus type 2 in nonobese (HCC)   On Metformin  po.             Meds ordered this encounter  Medications   methylPREDNISolone  acetate (DEPO-MEDROL ) injection 80 mg      Follow-up: Return in about 3 months (around 11/17/2023) for a follow-up visit.  Anitra Barn, MD

## 2023-08-18 ENCOUNTER — Encounter: Payer: Self-pay | Admitting: Internal Medicine

## 2023-08-18 NOTE — Assessment & Plan Note (Signed)
 On Metformin  po.

## 2023-08-18 NOTE — Assessment & Plan Note (Signed)
 R recurrent trochanteric bursitis Hip opener exercises Blue-Emu cream was recommended to use 2-3 times a day Injected - see procedure

## 2023-08-24 ENCOUNTER — Ambulatory Visit: Admitting: Internal Medicine

## 2023-09-14 ENCOUNTER — Other Ambulatory Visit: Payer: Self-pay | Admitting: Internal Medicine

## 2023-09-14 ENCOUNTER — Other Ambulatory Visit

## 2023-09-14 ENCOUNTER — Other Ambulatory Visit (INDEPENDENT_AMBULATORY_CARE_PROVIDER_SITE_OTHER)

## 2023-09-14 DIAGNOSIS — R350 Frequency of micturition: Secondary | ICD-10-CM

## 2023-09-14 DIAGNOSIS — E119 Type 2 diabetes mellitus without complications: Secondary | ICD-10-CM | POA: Diagnosis not present

## 2023-09-14 DIAGNOSIS — D649 Anemia, unspecified: Secondary | ICD-10-CM | POA: Diagnosis not present

## 2023-09-14 LAB — CBC WITH DIFFERENTIAL/PLATELET
Basophils Absolute: 0 10*3/uL (ref 0.0–0.1)
Basophils Relative: 0.5 % (ref 0.0–3.0)
Eosinophils Absolute: 0.1 10*3/uL (ref 0.0–0.7)
Eosinophils Relative: 2.1 % (ref 0.0–5.0)
HCT: 37.6 % (ref 36.0–46.0)
Hemoglobin: 12.5 g/dL (ref 12.0–15.0)
Lymphocytes Relative: 20.6 % (ref 12.0–46.0)
Lymphs Abs: 1.2 10*3/uL (ref 0.7–4.0)
MCHC: 33.1 g/dL (ref 30.0–36.0)
MCV: 87.6 fl (ref 78.0–100.0)
Monocytes Absolute: 0.4 10*3/uL (ref 0.1–1.0)
Monocytes Relative: 7 % (ref 3.0–12.0)
Neutro Abs: 4.1 10*3/uL (ref 1.4–7.7)
Neutrophils Relative %: 69.8 % (ref 43.0–77.0)
Platelets: 269 10*3/uL (ref 150.0–400.0)
RBC: 4.3 Mil/uL (ref 3.87–5.11)
RDW: 13 % (ref 11.5–15.5)
WBC: 5.9 10*3/uL (ref 4.0–10.5)

## 2023-09-14 LAB — LIPID PANEL
Cholesterol: 180 mg/dL (ref 0–200)
HDL: 74.9 mg/dL (ref >39.00)
LDL Cholesterol: 84 mg/dL (ref 0–99)
NonHDL: 105.26
Total CHOL/HDL Ratio: 2
Triglycerides: 105 mg/dL (ref 0.0–149.0)
VLDL: 21 mg/dL (ref 0.0–40.0)

## 2023-09-14 LAB — COMPREHENSIVE METABOLIC PANEL WITH GFR
ALT: 24 U/L (ref 0–35)
AST: 20 U/L (ref 0–37)
Albumin: 4.4 g/dL (ref 3.5–5.2)
Alkaline Phosphatase: 81 U/L (ref 39–117)
BUN: 16 mg/dL (ref 6–23)
CO2: 30 meq/L (ref 19–32)
Calcium: 9.9 mg/dL (ref 8.4–10.5)
Chloride: 103 meq/L (ref 96–112)
Creatinine, Ser: 0.79 mg/dL (ref 0.40–1.20)
GFR: 72.84 mL/min (ref >60.00)
Glucose, Bld: 108 mg/dL — ABNORMAL HIGH (ref 70–99)
Potassium: 4.4 meq/L (ref 3.5–5.1)
Sodium: 142 meq/L (ref 135–145)
Total Bilirubin: 0.5 mg/dL (ref 0.2–1.2)
Total Protein: 6.8 g/dL (ref 6.0–8.3)

## 2023-09-14 LAB — TSH: TSH: 1.49 u[IU]/mL (ref 0.35–5.50)

## 2023-09-14 LAB — HEMOGLOBIN A1C: Hgb A1c MFr Bld: 6.8 % — ABNORMAL HIGH (ref 4.6–6.5)

## 2023-09-15 ENCOUNTER — Ambulatory Visit: Payer: Self-pay | Admitting: Internal Medicine

## 2023-09-15 ENCOUNTER — Ambulatory Visit: Payer: Medicare Other | Admitting: Internal Medicine

## 2023-09-22 ENCOUNTER — Encounter: Payer: Self-pay | Admitting: Internal Medicine

## 2023-09-22 ENCOUNTER — Ambulatory Visit: Admitting: Internal Medicine

## 2023-09-22 VITALS — BP 114/78 | HR 75 | Temp 98.2°F | Ht 62.0 in

## 2023-09-22 DIAGNOSIS — F418 Other specified anxiety disorders: Secondary | ICD-10-CM | POA: Diagnosis not present

## 2023-09-22 DIAGNOSIS — E119 Type 2 diabetes mellitus without complications: Secondary | ICD-10-CM | POA: Diagnosis not present

## 2023-09-22 DIAGNOSIS — R7989 Other specified abnormal findings of blood chemistry: Secondary | ICD-10-CM

## 2023-09-22 DIAGNOSIS — Z7984 Long term (current) use of oral hypoglycemic drugs: Secondary | ICD-10-CM

## 2023-09-22 DIAGNOSIS — Z0000 Encounter for general adult medical examination without abnormal findings: Secondary | ICD-10-CM

## 2023-09-22 DIAGNOSIS — F5101 Primary insomnia: Secondary | ICD-10-CM

## 2023-09-22 MED ORDER — DOXEPIN HCL 75 MG PO CAPS: 75.0000 mg | ORAL_CAPSULE | Freq: Every day | ORAL | 2 refills | Status: AC

## 2023-09-22 NOTE — Assessment & Plan Note (Signed)
 On Metformin  po.

## 2023-09-22 NOTE — Assessment & Plan Note (Signed)
Continue on citalopram Evelyn Murphy is exercising at home 7 days a week, swimming 3 days a week, walking 12,000 steps a day.

## 2023-09-22 NOTE — Progress Notes (Signed)
 Subjective:  Patient ID: Evelyn Murphy, female    DOB: 05/11/1945  Age: 78 y.o. MRN: [id number]  CC: Medical Management of Chronic Issues   HPI Evelyn Murphy presents for depression, HTN, DM  Outpatient Medications Prior to Visit  Medication Sig Dispense Refill   aspirin  EC 81 MG tablet Take 1 tablet (81 mg total) by mouth daily. 30 tablet 0   cefdinir  (OMNICEF ) 300 MG capsule Take 1 capsule (300 mg total) by mouth 2 (two) times daily. 20 capsule 0   citalopram  (CELEXA ) 20 MG tablet TAKE 1 TABLET BY MOUTH EVERY DAY 90 tablet 1   ezetimibe  (ZETIA ) 10 MG tablet TAKE 1 TABLET BY MOUTH EVERY DAY 90 tablet 3   Fluticasone  Furoate (ARNUITY ELLIPTA ) 100 MCG/ACT AEPB Inhale 1 puff into the lungs daily. 30 each 1   linaclotide  (LINZESS ) 72 MCG capsule Take 1 capsule (72 mcg total) by mouth daily before breakfast. 90 capsule 3   metFORMIN  (GLUCOPHAGE ) 500 MG tablet TAKE 1 TABLET BY MOUTH EVERY DAY WITH BREAKFAST 90 tablet 3   Multiple Vitamins-Minerals (MULTIVITAMIN WITH MINERALS) tablet Take 1 tablet by mouth daily.      neomycin-polymyxin b-dexamethasone  (MAXITROL) 3.5-10000-0.1 SUSP 1 drop every 4 (four) hours.     prednisoLONE acetate (PRED FORTE) 1 % ophthalmic suspension 1 drop 3 (three) times daily.     rosuvastatin  (CRESTOR ) 10 MG tablet TAKE 1 TABLET BY MOUTH EVERY DAY 90 tablet 3   UNABLE TO FIND Med Name: Valerian Root 2 per day     doxepin  (SINEQUAN ) 75 MG capsule Take 1 capsule (75 mg total) by mouth at bedtime. 180 capsule 1   No facility-administered medications prior to visit.    ROS: Review of Systems  Constitutional:  Negative for activity change, appetite change, chills, fatigue and unexpected weight change.  HENT:  Negative for congestion, mouth sores and sinus pressure.   Eyes:  Negative for visual disturbance.  Respiratory:  Negative for cough and chest tightness.   Gastrointestinal:  Negative for abdominal pain and nausea.  Genitourinary:  Negative for difficulty  urinating, frequency and vaginal pain.  Musculoskeletal:  Negative for back pain and gait problem.  Skin:  Negative for pallor and rash.  Neurological:  Negative for dizziness, tremors, weakness, numbness and headaches.  Psychiatric/Behavioral:  Negative for confusion, sleep disturbance and suicidal ideas.     Objective:  BP 114/78 (BP Location: Left Arm, Patient Position: Sitting, Cuff Size: Normal)   Pulse 75   Temp 98.2 F (36.8 C) (Oral)   Ht 5' 2 (1.575 m)   BMI 24.69 kg/m   BP Readings from Last 3 Encounters:  09/22/23 114/78  08/17/23 114/70  03/17/23 118/70    Wt Readings from Last 3 Encounters:  11/09/22 135 lb (61.2 kg)  03/01/22 135 lb (61.2 kg)  10/23/21 135 lb (61.2 kg)    Physical Exam Constitutional:      General: She is not in acute distress.    Appearance: She is well-developed. She is obese.  HENT:     Head: Normocephalic.     Right Ear: External ear normal.     Left Ear: External ear normal.     Nose: Nose normal.  Eyes:     General:        Right eye: No discharge.        Left eye: No discharge.     Conjunctiva/sclera: Conjunctivae normal.     Pupils: Pupils are equal, round, and reactive to light.  Neck:     Thyroid : No thyromegaly.     Vascular: No JVD.     Trachea: No tracheal deviation.  Cardiovascular:     Rate and Rhythm: Normal rate and regular rhythm.     Heart sounds: Normal heart sounds.  Pulmonary:     Effort: No respiratory distress.     Breath sounds: No stridor. No wheezing.  Abdominal:     General: Bowel sounds are normal. There is no distension.     Palpations: Abdomen is soft. There is no mass.     Tenderness: There is no abdominal tenderness. There is no guarding or rebound.  Musculoskeletal:        General: No tenderness.     Cervical back: Normal range of motion and neck supple. No rigidity.     Right lower leg: No edema.     Left lower leg: No edema.  Lymphadenopathy:     Cervical: No cervical adenopathy.   Skin:    Findings: No erythema or rash.  Neurological:     Mental Status: She is oriented to person, place, and time.     Cranial Nerves: No cranial nerve deficit.     Motor: No abnormal muscle tone.     Coordination: Coordination normal.     Deep Tendon Reflexes: Reflexes normal.  Psychiatric:        Behavior: Behavior normal.        Thought Content: Thought content normal.        Judgment: Judgment normal.     Lab Results  Component Value Date   WBC 5.9 09/14/2023   HGB 12.5 09/14/2023   HCT 37.6 09/14/2023   PLT 269.0 09/14/2023   GLUCOSE 108 (H) 09/14/2023   CHOL 180 09/14/2023   TRIG 105.0 09/14/2023   HDL 74.90 09/14/2023   LDLDIRECT 57.0 08/30/2022   LDLCALC 84 09/14/2023   ALT 24 09/14/2023   AST 20 09/14/2023   NA 142 09/14/2023   K 4.4 09/14/2023   CL 103 09/14/2023   CREATININE 0.79 09/14/2023   BUN 16 09/14/2023   CO2 30 09/14/2023   TSH 1.49 09/14/2023   INR 0.96 06/29/2014   HGBA1C 6.8 (H) 09/14/2023    VAS US  CAROTID Result Date: 11/25/2022 Carotid Arterial Duplex Study Patient Name:  Evelyn Murphy  Date of Exam:   11/25/2022 Medical Rec #: [id number]       Accession #:    [id number] Date of Birth: [birth year]        Patient Gender: F Patient Age:   52 years Exam Location:  [facility] Procedure:      VAS US  CAROTID Referring Phys: [facility] CHANDRASEKHAR --------------------------------------------------------------------------------  Indications:  Mild carotid artery disease follow-up. Patient denies any               cerebrovascular symptoms. Risk Factors: Hyperlipidemia, Diabetes, no history of smoking. Limitations   Today's exam was limited due to the high bifurcation of the               carotid. Performing Technologist: Edsel Mustard RVT  Examination Guidelines: A complete evaluation includes B-mode imaging, spectral Doppler, color Doppler, and power Doppler as needed of all accessible portions of each vessel. Bilateral testing is considered an integral part  of a complete examination. Limited examinations for reoccurring indications may be performed as noted.  Right Carotid Findings: +----------+--------+--------+--------+------------------+--------------+           PSV cm/sEDV cm/sStenosisPlaque DescriptionComments       +----------+--------+--------+--------+------------------+--------------+ CCA  Prox  56      11                                               +----------+--------+--------+--------+------------------+--------------+ CCA Distal82      29                                               +----------+--------+--------+--------+------------------+--------------+ ICA Prox  70      26      1-39%   smooth            minimal plaque +----------+--------+--------+--------+------------------+--------------+ ICA Mid   68      30                                tortuous       +----------+--------+--------+--------+------------------+--------------+ ICA Distal95      37                                               +----------+--------+--------+--------+------------------+--------------+ ECA       65      10                                               +----------+--------+--------+--------+------------------+--------------+ +----------+--------+-------+----------------+-------------------+           PSV cm/sEDV cmsDescribe        Arm Pressure (mmHG) +----------+--------+-------+----------------+-------------------+ Subclavian110            Multiphasic, WNL                    +----------+--------+-------+----------------+-------------------+ +---------+--------+--+--------+--+---------+ VertebralPSV cm/s40EDV cm/s12Antegrade +---------+--------+--+--------+--+---------+  Left Carotid Findings: +----------+--------+--------+--------+------------------+--------------+           PSV cm/sEDV cm/sStenosisPlaque DescriptionComments        +----------+--------+--------+--------+------------------+--------------+ CCA Prox  103     23                                               +----------+--------+--------+--------+------------------+--------------+ CCA Distal88      29                                               +----------+--------+--------+--------+------------------+--------------+ ICA Prox  50      14              smooth            minimal plaque +----------+--------+--------+--------+------------------+--------------+ ICA Mid   74      40      1-39%                                    +----------+--------+--------+--------+------------------+--------------+  ICA Distal78      34                                               +----------+--------+--------+--------+------------------+--------------+ ECA       53      13                                               +----------+--------+--------+--------+------------------+--------------+ +----------+--------+--------+----------------+-------------------+           PSV cm/sEDV cm/sDescribe        Arm Pressure (mmHG) +----------+--------+--------+----------------+-------------------+ Dlarojcpjw52              Multiphasic, WNL                    +----------+--------+--------+----------------+-------------------+ +---------+--------+--+--------+--+---------+ VertebralPSV cm/s67EDV cm/s26Antegrade +---------+--------+--+--------+--+---------+   Summary: Right Carotid: Velocities in the right ICA are consistent with a 1-39% stenosis.                Minimal plaque noted. Left Carotid: Velocities in the left ICA are consistent with a 1-39% stenosis.               Minimal plaque noted. Vertebrals:  Bilateral vertebral arteries demonstrate antegrade flow. Subclavians: Normal flow hemodynamics were seen in bilateral subclavian              arteries. *See table(s) above for measurements and observations.  Electronically signed by Dorn Lesches MD  on 11/25/2022 at 1:12:58 PM.    Final     Assessment & Plan:   Problem List Items Addressed This Visit     Insomnia   Continue with doxepin  at night.      Depression with anxiety   Continue on citalopram  Evelyn Murphy is exercising at home 7 days a week, swimming 3 days a week, walking 12,000 steps a day.       Relevant Medications   doxepin  (SINEQUAN ) 75 MG capsule   Abnormal CBC   Eosinophilia. Mild and chronic ?etiol - repeat q 6 mo      Relevant Orders   CBC with Differential/Platelet   Well adult exam   Relevant Orders   TSH   Urinalysis   CBC with Differential/Platelet   Lipid panel   Comprehensive metabolic panel with GFR   Hemoglobin A1c   Microalbumin / creatinine urine ratio   Diabetes mellitus type 2 in nonobese (HCC) - Primary   On Metformin  po        Relevant Orders   Hemoglobin A1c   Microalbumin / creatinine urine ratio      Meds ordered this encounter  Medications   doxepin  (SINEQUAN ) 75 MG capsule    Sig: Take 1-2 capsules (75-150 mg total) by mouth at bedtime.    Dispense:  180 capsule    Refill:  2      Follow-up: Return in about 6 months (around 03/24/2024) for Wellness Exam.  Marolyn Noel, MD

## 2023-10-03 NOTE — Assessment & Plan Note (Signed)
Continue with doxepin at night

## 2023-10-03 NOTE — Assessment & Plan Note (Signed)
Eosinophilia. Mild and chronic ?etiol - repeat q 6 mo

## 2023-11-03 ENCOUNTER — Telehealth: Payer: Self-pay

## 2023-11-03 DIAGNOSIS — G8929 Other chronic pain: Secondary | ICD-10-CM

## 2023-11-03 DIAGNOSIS — Z01419 Encounter for gynecological examination (general) (routine) without abnormal findings: Secondary | ICD-10-CM | POA: Diagnosis not present

## 2023-11-03 DIAGNOSIS — Z1231 Encounter for screening mammogram for malignant neoplasm of breast: Secondary | ICD-10-CM | POA: Diagnosis not present

## 2023-11-03 NOTE — Telephone Encounter (Signed)
 Copied from CRM [id number]. Topic: Clinical - Medical Advice >> Nov 03, 2023 12:07 PM [name] wrote: Reason for CRM: Patient called in stated the shot did not work and she needs to know what else she should do regarding this, wanted to know if she needs to see the sports medicine doctor, or come back in and see Dr.Plotnikov would like a respond through mychart

## 2023-11-08 NOTE — Telephone Encounter (Signed)
Please refer pt to ortho

## 2023-11-08 NOTE — Telephone Encounter (Signed)
 Okay.  Thanks.

## 2023-11-08 NOTE — Telephone Encounter (Signed)
 I am sorry.  We can have Vina see an orthopedist for consultation.  Thanks

## 2023-11-18 DIAGNOSIS — Z23 Encounter for immunization: Secondary | ICD-10-CM | POA: Diagnosis not present

## 2023-11-30 DIAGNOSIS — M7061 Trochanteric bursitis, right hip: Secondary | ICD-10-CM | POA: Diagnosis not present

## 2023-12-09 DIAGNOSIS — Z23 Encounter for immunization: Secondary | ICD-10-CM | POA: Diagnosis not present

## 2024-01-23 DIAGNOSIS — L570 Actinic keratosis: Secondary | ICD-10-CM | POA: Diagnosis not present

## 2024-01-23 DIAGNOSIS — X32XXXD Exposure to sunlight, subsequent encounter: Secondary | ICD-10-CM | POA: Diagnosis not present

## 2024-02-12 ENCOUNTER — Other Ambulatory Visit: Payer: Self-pay | Admitting: Internal Medicine

## 2024-02-21 ENCOUNTER — Ambulatory Visit: Admitting: Family Medicine

## 2024-02-22 ENCOUNTER — Ambulatory Visit (INDEPENDENT_AMBULATORY_CARE_PROVIDER_SITE_OTHER)

## 2024-02-22 ENCOUNTER — Encounter: Payer: Self-pay | Admitting: Internal Medicine

## 2024-02-22 ENCOUNTER — Ambulatory Visit: Admitting: Internal Medicine

## 2024-02-22 VITALS — BP 122/80 | HR 78 | Temp 98.1°F | Ht 62.0 in | Wt 135.0 lb

## 2024-02-22 DIAGNOSIS — M533 Sacrococcygeal disorders, not elsewhere classified: Secondary | ICD-10-CM | POA: Diagnosis not present

## 2024-02-22 DIAGNOSIS — M545 Low back pain, unspecified: Secondary | ICD-10-CM | POA: Diagnosis not present

## 2024-02-22 DIAGNOSIS — F5101 Primary insomnia: Secondary | ICD-10-CM | POA: Diagnosis not present

## 2024-02-22 DIAGNOSIS — R102 Pelvic and perineal pain unspecified side: Secondary | ICD-10-CM | POA: Diagnosis not present

## 2024-02-22 DIAGNOSIS — G8929 Other chronic pain: Secondary | ICD-10-CM | POA: Diagnosis not present

## 2024-02-22 DIAGNOSIS — M160 Bilateral primary osteoarthritis of hip: Secondary | ICD-10-CM | POA: Diagnosis not present

## 2024-02-22 MED ORDER — NABUMETONE 500 MG PO TABS: 500.0000 mg | ORAL_TABLET | Freq: Two times a day (BID) | ORAL | 1 refills | Status: DC | PRN

## 2024-02-22 NOTE — Progress Notes (Unsigned)
 Subjective:  Patient ID: Evelyn Murphy, female    DOB: May 03, 1945  Age: 78 y.o. MRN: [id number]  CC: Leg Pain (R leg and lower back; pain started about a month ago; hard to explain pain but hurts to go up and down stairs, can't sit for long periods of time; back pain has been waking me up at night)   HPI Evelyn Murphy presents for LBP - worse; R leg pain - anterior thigh Can't go up or down the steps, has to lead w/her L leg Evelyn took Naproxen. Waking up w/pain   Outpatient Medications Prior to Visit  Medication Sig Dispense Refill   aspirin  EC 81 MG tablet Take 1 tablet (81 mg total) by mouth daily. 30 tablet 0   cefdinir  (OMNICEF ) 300 MG capsule Take 1 capsule (300 mg total) by mouth 2 (two) times daily. 20 capsule 0   citalopram  (CELEXA ) 20 MG tablet TAKE 1 TABLET BY MOUTH EVERY DAY 90 tablet 1   doxepin  (SINEQUAN ) 75 MG capsule Take 1-2 capsules (75-150 mg total) by mouth at bedtime. 180 capsule 2   ezetimibe  (ZETIA ) 10 MG tablet TAKE 1 TABLET BY MOUTH EVERY DAY 90 tablet 3   Fluticasone  Furoate (ARNUITY ELLIPTA ) 100 MCG/ACT AEPB Inhale 1 puff into the lungs daily. 30 each 1   linaclotide  (LINZESS ) 72 MCG capsule Take 1 capsule (72 mcg total) by mouth daily before breakfast. 90 capsule 3   metFORMIN  (GLUCOPHAGE ) 500 MG tablet TAKE 1 TABLET BY MOUTH EVERY DAY WITH BREAKFAST 90 tablet 3   Multiple Vitamins-Minerals (MULTIVITAMIN WITH MINERALS) tablet Take 1 tablet by mouth daily.      neomycin-polymyxin b-dexamethasone  (MAXITROL) 3.5-10000-0.1 SUSP 1 drop every 4 (four) hours.     prednisoLONE acetate (PRED FORTE) 1 % ophthalmic suspension 1 drop 3 (three) times daily.     rosuvastatin  (CRESTOR ) 10 MG tablet TAKE 1 TABLET BY MOUTH EVERY DAY 90 tablet 3   UNABLE TO FIND Med Name: Valerian Root 2 per day     No facility-administered medications prior to visit.    ROS: Review of Systems  Constitutional:  Negative for activity change, appetite change, chills, fatigue and  unexpected weight change.  HENT:  Negative for congestion, mouth sores and sinus pressure.   Eyes:  Negative for visual disturbance.  Respiratory:  Negative for cough and chest tightness.   Gastrointestinal:  Negative for abdominal pain and nausea.  Genitourinary:  Negative for difficulty urinating, frequency and vaginal pain.  Musculoskeletal:  Negative for back pain and gait problem.  Skin:  Negative for pallor and rash.  Neurological:  Negative for dizziness, tremors, weakness, numbness and headaches.  Psychiatric/Behavioral:  Negative for confusion and sleep disturbance.     Objective:  BP 122/80   Pulse 78   Temp 98.1 F (36.7 C)   Ht 5' 2 (1.575 m)   Wt 135 lb (61.2 kg)   SpO2 98%   BMI 24.69 kg/m   BP Readings from Last 3 Encounters:  02/22/24 122/80  09/22/23 114/78  08/17/23 114/70    Wt Readings from Last 3 Encounters:  02/22/24 135 lb (61.2 kg)  11/09/22 135 lb (61.2 kg)  03/01/22 135 lb (61.2 kg)    Physical Exam Constitutional:      General: She is not in acute distress.    Appearance: Normal appearance. She is well-developed.  HENT:     Head: Normocephalic.     Right Ear: External ear normal.     Left Ear:  External ear normal.     Nose: Nose normal.  Eyes:     General:        Right eye: No discharge.        Left eye: No discharge.     Conjunctiva/sclera: Conjunctivae normal.     Pupils: Pupils are equal, round, and reactive to light.  Neck:     Thyroid : No thyromegaly.     Vascular: No JVD.     Trachea: No tracheal deviation.  Cardiovascular:     Rate and Rhythm: Normal rate and regular rhythm.     Heart sounds: Normal heart sounds.  Pulmonary:     Effort: No respiratory distress.     Breath sounds: No stridor. No wheezing.  Abdominal:     General: Bowel sounds are normal. There is no distension.     Palpations: Abdomen is soft. There is no mass.     Tenderness: There is no abdominal tenderness. There is no guarding or rebound.   Musculoskeletal:        General: No tenderness.     Cervical back: Normal range of motion and neck supple. No rigidity.  Lymphadenopathy:     Cervical: No cervical adenopathy.  Skin:    Findings: No erythema or rash.  Neurological:     Cranial Nerves: No cranial nerve deficit.     Motor: No abnormal muscle tone.     Coordination: Coordination normal.     Deep Tendon Reflexes: Reflexes normal.  Psychiatric:        Behavior: Behavior normal.        Thought Content: Thought content normal.        Judgment: Judgment normal.     Lab Results  Component Value Date   WBC 5.9 09/14/2023   HGB 12.5 09/14/2023   HCT 37.6 09/14/2023   PLT 269.0 09/14/2023   GLUCOSE 108 (H) 09/14/2023   CHOL 180 09/14/2023   TRIG 105.0 09/14/2023   HDL 74.90 09/14/2023   LDLDIRECT 57.0 08/30/2022   LDLCALC 84 09/14/2023   ALT 24 09/14/2023   AST 20 09/14/2023   NA 142 09/14/2023   K 4.4 09/14/2023   CL 103 09/14/2023   CREATININE 0.79 09/14/2023   BUN 16 09/14/2023   CO2 30 09/14/2023   TSH 1.49 09/14/2023   INR 0.96 06/29/2014   HGBA1C 6.8 (H) 09/14/2023    VAS US  CAROTID Result Date: 11/25/2022 Carotid Arterial Duplex Study Patient Name:  Evelyn Murphy  Date of Exam:   11/25/2022 Medical Rec #: [id number]       Accession #:    [id number] Date of Birth: 1945/07/06        Patient Gender: F Patient Age:   78 years Exam Location:  [facility] Procedure:      VAS US  CAROTID Referring Phys: [facility] CHANDRASEKHAR --------------------------------------------------------------------------------  Indications:  Mild carotid artery disease follow-up. Patient denies any               cerebrovascular symptoms. Risk Factors: Hyperlipidemia, Diabetes, no history of smoking. Limitations   Today's exam was limited due to the high bifurcation of the               carotid. Performing Technologist: Edsel Mustard RVT  Examination Guidelines: A complete evaluation includes B-mode imaging, spectral Doppler, color Doppler, and  power Doppler as needed of all accessible portions of each vessel. Bilateral testing is considered an integral part of a complete examination. Limited examinations for reoccurring indications may be performed as  noted.  Right Carotid Findings: +----------+--------+--------+--------+------------------+--------------+           PSV cm/sEDV cm/sStenosisPlaque DescriptionComments       +----------+--------+--------+--------+------------------+--------------+ CCA Prox  56      11                                               +----------+--------+--------+--------+------------------+--------------+ CCA Distal82      29                                               +----------+--------+--------+--------+------------------+--------------+ ICA Prox  70      26      1-39%   smooth            minimal plaque +----------+--------+--------+--------+------------------+--------------+ ICA Mid   68      30                                tortuous       +----------+--------+--------+--------+------------------+--------------+ ICA Distal95      37                                               +----------+--------+--------+--------+------------------+--------------+ ECA       65      10                                               +----------+--------+--------+--------+------------------+--------------+ +----------+--------+-------+----------------+-------------------+           PSV cm/sEDV cmsDescribe        Arm Pressure (mmHG) +----------+--------+-------+----------------+-------------------+ Subclavian110            Multiphasic, WNL                    +----------+--------+-------+----------------+-------------------+ +---------+--------+--+--------+--+---------+ VertebralPSV cm/s40EDV cm/s12Antegrade +---------+--------+--+--------+--+---------+  Left Carotid Findings: +----------+--------+--------+--------+------------------+--------------+           PSV cm/sEDV  cm/sStenosisPlaque DescriptionComments       +----------+--------+--------+--------+------------------+--------------+ CCA Prox  103     23                                               +----------+--------+--------+--------+------------------+--------------+ CCA Distal88      29                                               +----------+--------+--------+--------+------------------+--------------+ ICA Prox  50      14              smooth            minimal plaque +----------+--------+--------+--------+------------------+--------------+ ICA Mid   74      40      1-39%                                    +----------+--------+--------+--------+------------------+--------------+  ICA Distal78      34                                               +----------+--------+--------+--------+------------------+--------------+ ECA       53      13                                               +----------+--------+--------+--------+------------------+--------------+ +----------+--------+--------+----------------+-------------------+           PSV cm/sEDV cm/sDescribe        Arm Pressure (mmHG) +----------+--------+--------+----------------+-------------------+ Dlarojcpjw52              Multiphasic, WNL                    +----------+--------+--------+----------------+-------------------+ +---------+--------+--+--------+--+---------+ VertebralPSV cm/s67EDV cm/s26Antegrade +---------+--------+--+--------+--+---------+   Summary: Right Carotid: Velocities in the right ICA are consistent with a 1-39% stenosis.                Minimal plaque noted. Left Carotid: Velocities in the left ICA are consistent with a 1-39% stenosis.               Minimal plaque noted. Vertebrals:  Bilateral vertebral arteries demonstrate antegrade flow. Subclavians: Normal flow hemodynamics were seen in bilateral subclavian              arteries. *See table(s) above for measurements and  observations.  Electronically signed by Dorn Lesches MD on 11/25/2022 at 1:12:58 PM.    Final     Assessment & Plan:   Problem List Items Addressed This Visit     Sacroiliac joint pain - Primary   Relevant Orders   Ambulatory referral to Physical Therapy   XR HIP UNILAT W OR W/O PELVIS 2-3 VIEWS RIGHT   Other Visit Diagnoses       Pelvic pain       Relevant Orders   US  PELVIC COMPLETE WITH TRANSVAGINAL         Meds ordered this encounter  Medications   nabumetone  (RELAFEN ) 500 MG tablet    Sig: Take 1 tablet (500 mg total) by mouth 2 (two) times daily as needed for moderate pain (pain score 4-6).    Dispense:  60 tablet    Refill:  1      Follow-up: No follow-ups on file.  Marolyn Noel, MD

## 2024-02-22 NOTE — Assessment & Plan Note (Signed)
 SI joint pain Chiropractor suggested Start PT - [facility]

## 2024-02-23 ENCOUNTER — Ambulatory Visit: Payer: Self-pay | Admitting: Internal Medicine

## 2024-03-02 ENCOUNTER — Inpatient Hospital Stay: Admission: RE | Admit: 2024-03-02 | Discharge: 2024-03-02 | Attending: Internal Medicine | Admitting: Internal Medicine

## 2024-03-02 DIAGNOSIS — R102 Pelvic and perineal pain unspecified side: Secondary | ICD-10-CM

## 2024-03-02 DIAGNOSIS — R1031 Right lower quadrant pain: Secondary | ICD-10-CM | POA: Diagnosis not present

## 2024-03-05 ENCOUNTER — Other Ambulatory Visit: Payer: Self-pay

## 2024-03-06 NOTE — Assessment & Plan Note (Signed)
Continue with doxepin at night

## 2024-03-06 NOTE — Assessment & Plan Note (Signed)
Stretching exercises

## 2024-03-08 ENCOUNTER — Other Ambulatory Visit: Payer: Self-pay | Admitting: Internal Medicine

## 2024-03-26 ENCOUNTER — Ambulatory Visit: Admitting: Internal Medicine

## 2024-03-27 ENCOUNTER — Other Ambulatory Visit (INDEPENDENT_AMBULATORY_CARE_PROVIDER_SITE_OTHER)

## 2024-03-27 DIAGNOSIS — E119 Type 2 diabetes mellitus without complications: Secondary | ICD-10-CM

## 2024-03-27 DIAGNOSIS — Z0000 Encounter for general adult medical examination without abnormal findings: Secondary | ICD-10-CM

## 2024-03-27 DIAGNOSIS — R7989 Other specified abnormal findings of blood chemistry: Secondary | ICD-10-CM

## 2024-03-27 LAB — CBC WITH DIFFERENTIAL/PLATELET
Basophils Absolute: 0 K/uL (ref 0.0–0.1)
Basophils Relative: 0.8 % (ref 0.0–3.0)
Eosinophils Absolute: 0.2 K/uL (ref 0.0–0.7)
Eosinophils Relative: 2.8 % (ref 0.0–5.0)
HCT: 36.9 % (ref 36.0–46.0)
Hemoglobin: 12.2 g/dL (ref 12.0–15.0)
Lymphocytes Relative: 27 % (ref 12.0–46.0)
Lymphs Abs: 1.6 K/uL (ref 0.7–4.0)
MCHC: 33 g/dL (ref 30.0–36.0)
MCV: 87 fl (ref 78.0–100.0)
Monocytes Absolute: 0.5 K/uL (ref 0.1–1.0)
Monocytes Relative: 7.6 % (ref 3.0–12.0)
Neutro Abs: 3.7 K/uL (ref 1.4–7.7)
Neutrophils Relative %: 61.8 % (ref 43.0–77.0)
Platelets: 255 K/uL (ref 150.0–400.0)
RBC: 4.24 Mil/uL (ref 3.87–5.11)
RDW: 13.1 % (ref 11.5–15.5)
WBC: 6 K/uL (ref 4.0–10.5)

## 2024-03-27 LAB — TSH: TSH: 3.09 u[IU]/mL (ref 0.35–5.50)

## 2024-03-27 LAB — COMPREHENSIVE METABOLIC PANEL WITH GFR
ALT: 16 U/L (ref 3–35)
AST: 18 U/L (ref 5–37)
Albumin: 4.3 g/dL (ref 3.5–5.2)
Alkaline Phosphatase: 76 U/L (ref 39–117)
BUN: 17 mg/dL (ref 6–23)
CO2: 28 meq/L (ref 19–32)
Calcium: 9.6 mg/dL (ref 8.4–10.5)
Chloride: 104 meq/L (ref 96–112)
Creatinine, Ser: 0.82 mg/dL (ref 0.40–1.20)
GFR: 69.39 mL/min
Glucose, Bld: 169 mg/dL — ABNORMAL HIGH (ref 70–99)
Potassium: 4.3 meq/L (ref 3.5–5.1)
Sodium: 141 meq/L (ref 135–145)
Total Bilirubin: 0.4 mg/dL (ref 0.2–1.2)
Total Protein: 6.9 g/dL (ref 6.0–8.3)

## 2024-03-27 LAB — LIPID PANEL
Cholesterol: 130 mg/dL (ref 28–200)
HDL: 60.8 mg/dL
LDL Cholesterol: 46 mg/dL (ref 10–99)
NonHDL: 69.23
Total CHOL/HDL Ratio: 2
Triglycerides: 114 mg/dL (ref 10.0–149.0)
VLDL: 22.8 mg/dL (ref 0.0–40.0)

## 2024-03-27 LAB — HEMOGLOBIN A1C: Hgb A1c MFr Bld: 6.9 % — ABNORMAL HIGH (ref 4.6–6.5)

## 2024-03-28 ENCOUNTER — Ambulatory Visit: Payer: Self-pay | Admitting: Internal Medicine

## 2024-03-29 ENCOUNTER — Encounter: Payer: Self-pay | Admitting: Internal Medicine

## 2024-03-29 ENCOUNTER — Ambulatory Visit: Admitting: Internal Medicine

## 2024-03-29 VITALS — BP 110/74 | HR 82 | Temp 98.1°F | Ht 62.0 in | Wt 135.0 lb

## 2024-03-29 DIAGNOSIS — K5904 Chronic idiopathic constipation: Secondary | ICD-10-CM

## 2024-03-29 DIAGNOSIS — E119 Type 2 diabetes mellitus without complications: Secondary | ICD-10-CM

## 2024-03-29 DIAGNOSIS — R011 Cardiac murmur, unspecified: Secondary | ICD-10-CM | POA: Diagnosis not present

## 2024-03-29 DIAGNOSIS — F418 Other specified anxiety disorders: Secondary | ICD-10-CM | POA: Diagnosis not present

## 2024-03-29 DIAGNOSIS — I422 Other hypertrophic cardiomyopathy: Secondary | ICD-10-CM | POA: Insufficient documentation

## 2024-03-29 DIAGNOSIS — M7061 Trochanteric bursitis, right hip: Secondary | ICD-10-CM | POA: Diagnosis not present

## 2024-03-29 DIAGNOSIS — K219 Gastro-esophageal reflux disease without esophagitis: Secondary | ICD-10-CM | POA: Diagnosis not present

## 2024-03-29 DIAGNOSIS — Z7984 Long term (current) use of oral hypoglycemic drugs: Secondary | ICD-10-CM

## 2024-03-29 DIAGNOSIS — Z7985 Long-term (current) use of injectable non-insulin antidiabetic drugs: Secondary | ICD-10-CM | POA: Diagnosis not present

## 2024-03-29 DIAGNOSIS — E7800 Pure hypercholesterolemia, unspecified: Secondary | ICD-10-CM

## 2024-03-29 DIAGNOSIS — F5101 Primary insomnia: Secondary | ICD-10-CM

## 2024-03-29 MED ORDER — TIRZEPATIDE 2.5 MG/0.5ML ~~LOC~~ SOAJ: 2.5000 mg | SUBCUTANEOUS | 3 refills | Status: AC

## 2024-03-29 MED ORDER — METHYLPREDNISOLONE 4 MG PO TBPK: ORAL_TABLET | ORAL | 0 refills | Status: AC

## 2024-03-29 MED ORDER — ROSUVASTATIN CALCIUM 10 MG PO TABS: 10.0000 mg | ORAL_TABLET | Freq: Every day | ORAL | 3 refills | Status: AC

## 2024-03-29 NOTE — Assessment & Plan Note (Signed)
 On Crestor 

## 2024-03-29 NOTE — Progress Notes (Signed)
 "  Subjective:  Patient ID: Evelyn Murphy, female    DOB: September 08, 1945  Age: 79 y.o. MRN: [id number]  CC: Follow-up (Patient states review of her lab test and discuss lead again. )   HPI Evelyn Murphy presents for R hip pain - not better LBP - better   Outpatient Medications Prior to Visit  Medication Sig Dispense Refill   aspirin  EC 81 MG tablet Take 1 tablet (81 mg total) by mouth daily. 30 tablet 0   citalopram  (CELEXA ) 20 MG tablet TAKE 1 TABLET BY MOUTH EVERY DAY 90 tablet 1   doxepin  (SINEQUAN ) 75 MG capsule Take 1-2 capsules (75-150 mg total) by mouth at bedtime. 180 capsule 2   ezetimibe  (ZETIA ) 10 MG tablet TAKE 1 TABLET BY MOUTH EVERY DAY 90 tablet 3   Fluticasone  Furoate (ARNUITY ELLIPTA ) 100 MCG/ACT AEPB Inhale 1 puff into the lungs daily. 30 each 1   linaclotide  (LINZESS ) 72 MCG capsule Take 1 capsule (72 mcg total) by mouth daily before breakfast. 90 capsule 3   metFORMIN  (GLUCOPHAGE ) 500 MG tablet TAKE 1 TABLET BY MOUTH EVERY DAY WITH BREAKFAST 90 tablet 3   Multiple Vitamins-Minerals (MULTIVITAMIN WITH MINERALS) tablet Take 1 tablet by mouth daily.      nabumetone  (RELAFEN ) 500 MG tablet Take 1 tablet (500 mg total) by mouth 2 (two) times daily as needed for moderate pain (pain score 4-6). 60 tablet 1   neomycin-polymyxin b-dexamethasone  (MAXITROL) 3.5-10000-0.1 SUSP 1 drop every 4 (four) hours.     prednisoLONE acetate (PRED FORTE) 1 % ophthalmic suspension 1 drop 3 (three) times daily.     UNABLE TO FIND Med Name: Valerian Root 2 per day     cefdinir  (OMNICEF ) 300 MG capsule Take 1 capsule (300 mg total) by mouth 2 (two) times daily. 20 capsule 0   rosuvastatin  (CRESTOR ) 10 MG tablet TAKE 1 TABLET BY MOUTH EVERY DAY 90 tablet 3   No facility-administered medications prior to visit.    ROS: Review of Systems  Constitutional:  Negative for activity change, appetite change, chills, fatigue and unexpected weight change.  HENT:  Negative for congestion, mouth sores and  sinus pressure.   Eyes:  Negative for visual disturbance.  Respiratory:  Negative for cough and chest tightness.   Gastrointestinal:  Negative for abdominal pain and nausea.  Genitourinary:  Negative for difficulty urinating, frequency and vaginal pain.  Musculoskeletal:  Negative for back pain and gait problem.  Skin:  Negative for pallor and rash.  Neurological:  Negative for dizziness, tremors, weakness, numbness and headaches.  Psychiatric/Behavioral:  Negative for confusion and sleep disturbance.     Objective:  BP 110/74   Pulse 82   Temp 98.1 F (36.7 C) (Oral)   Ht 5' 2 (1.575 m)   Wt 135 lb (61.2 kg)   SpO2 92%   BMI 24.69 kg/m   BP Readings from Last 3 Encounters:  03/29/24 110/74  02/22/24 122/80  09/22/23 114/78    Wt Readings from Last 3 Encounters:  03/29/24 135 lb (61.2 kg)  02/22/24 135 lb (61.2 kg)  11/09/22 135 lb (61.2 kg)    Physical Exam Constitutional:      General: She is not in acute distress.    Appearance: She is well-developed. She is obese.  HENT:     Head: Normocephalic.     Right Ear: External ear normal.     Left Ear: External ear normal.     Nose: Nose normal.  Eyes:  General:        Right eye: No discharge.        Left eye: No discharge.     Conjunctiva/sclera: Conjunctivae normal.     Pupils: Pupils are equal, round, and reactive to light.  Neck:     Thyroid : No thyromegaly.     Vascular: No JVD.     Trachea: No tracheal deviation.  Cardiovascular:     Rate and Rhythm: Normal rate and regular rhythm.     Heart sounds: Murmur heard.  Pulmonary:     Effort: No respiratory distress.     Breath sounds: No stridor. No wheezing.  Abdominal:     General: Bowel sounds are normal. There is no distension.     Palpations: Abdomen is soft. There is no mass.     Tenderness: There is no abdominal tenderness. There is no guarding or rebound.  Musculoskeletal:        General: No tenderness.     Cervical back: Normal range of  motion and neck supple. No rigidity.     Right lower leg: No edema.     Left lower leg: No edema.  Lymphadenopathy:     Cervical: No cervical adenopathy.  Skin:    Findings: No erythema or rash.  Neurological:     Cranial Nerves: No cranial nerve deficit.     Motor: No abnormal muscle tone.     Coordination: Coordination normal.     Deep Tendon Reflexes: Reflexes normal.  Psychiatric:        Behavior: Behavior normal.        Thought Content: Thought content normal.        Judgment: Judgment normal.   R lat hip w/pain  Lab Results  Component Value Date   WBC 6.0 03/27/2024   HGB 12.2 03/27/2024   HCT 36.9 03/27/2024   PLT 255.0 03/27/2024   GLUCOSE 169 (H) 03/27/2024   CHOL 130 03/27/2024   TRIG 114.0 03/27/2024   HDL 60.80 03/27/2024   LDLDIRECT 57.0 08/30/2022   LDLCALC 46 03/27/2024   ALT 16 03/27/2024   AST 18 03/27/2024   NA 141 03/27/2024   K 4.3 03/27/2024   CL 104 03/27/2024   CREATININE 0.82 03/27/2024   BUN 17 03/27/2024   CO2 28 03/27/2024   TSH 3.09 03/27/2024   INR 0.96 06/29/2014   HGBA1C 6.9 (H) 03/27/2024    US  PELVIC COMPLETE WITH TRANSVAGINAL Result Date: 03/02/2024 CLINICAL DATA:  Right lower quadrant pain for 2 months. EXAM: TRANSABDOMINAL AND TRANSVAGINAL ULTRASOUND OF PELVIS TECHNIQUE: Both transabdominal and transvaginal ultrasound examinations of the pelvis were performed. Transabdominal technique was performed for global imaging of the pelvis including uterus, ovaries, adnexal regions, and pelvic cul-de-sac. It was necessary to proceed with endovaginal exam following the transabdominal exam to visualize the ovaries. COMPARISON:  None Available. FINDINGS: Uterus Measurements: 7.7 x 3.0 x 5.3 cm = volume: 65 mL. No fibroids or other mass visualized. Endometrium Thickness: 4 mm.  No focal abnormality visualized. Right ovary Measurements: 1.6 x 0.7 x 1.2 cm = volume: 0.7 mL. Normal appearance/no adnexal mass. Left ovary Measurements: Obscured by  overlying bowel gas. Other findings No intraperitoneal free fluid. IMPRESSION: 1. Unremarkable sonographic appearance of the uterus and right ovary. 2. Left ovary obscured by overlying bowel gas. Electronically Signed   By: Camellia Candle M.D.   On: 03/02/2024 14:53    Assessment & Plan:   Problem List Items Addressed This Visit     Insomnia  Continue with doxepin  at night.      Depression with anxiety   Continue on citalopram        GERD (gastroesophageal reflux disease)   Continue on Protonix       Constipation   Life-long problem Linzess  helps, but too strong, too $$$ Miralax 1-4 scoops/d Dulcolax tabs or supp prn      Hypercholesterolemia   On Crestor       Relevant Medications   rosuvastatin  (CRESTOR ) 10 MG tablet   Heart murmur   Suspected HCC in 2023 by ECHO.  Cardiac MRI IMPRESSION: Study does not meet criteria for hypertrophic cardiomyopathy. Suspect mild LVOT gradient related to hyperdynamic LV function. F/u w/Chandrasekhar       Trochanteric bursitis of right hip   Not better See Dr Delane for US  guided steroid inj Medrol  pack if worse (trip to [facility])      Relevant Orders   Ambulatory referral to Sports Medicine   Diabetes mellitus type 2 in nonobese (HCC) - Primary   Worse  Cont on Metformin  500 mg po every day Start Mounjaro  2.5 mg/wk sq Evelyn Murphy is exercising at home 7 days a week, swimming 3 days a week, walking 12,000 steps a day.       Relevant Medications   rosuvastatin  (CRESTOR ) 10 MG tablet   tirzepatide  (MOUNJARO ) 2.5 MG/0.5ML Pen   Other Relevant Orders   Hemoglobin A1c   Comprehensive metabolic panel with GFR   RESOLVED: Hypertrophic cardiomyopathy (HCC)   Relevant Medications   rosuvastatin  (CRESTOR ) 10 MG tablet      Meds ordered this encounter  Medications   rosuvastatin  (CRESTOR ) 10 MG tablet    Sig: Take 1 tablet (10 mg total) by mouth daily.    Dispense:  90 tablet    Refill:  3   tirzepatide  (MOUNJARO ) 2.5 MG/0.5ML Pen     Sig: Inject 2.5 mg into the skin once a week.    Dispense:  2 mL    Refill:  3   methylPREDNISolone  (MEDROL  DOSEPAK) 4 MG TBPK tablet    Sig: As directed    Dispense:  21 tablet    Refill:  0      Follow-up: Return in about 4 months (around 07/27/2024) for a follow-up visit.  Marolyn Noel, MD "

## 2024-03-29 NOTE — Assessment & Plan Note (Signed)
 Continue on Protonix

## 2024-03-29 NOTE — Assessment & Plan Note (Signed)
 Suspected HCC in 2023 by ECHO.  Cardiac MRI IMPRESSION: Study does not meet criteria for hypertrophic cardiomyopathy. Suspect mild LVOT gradient related to hyperdynamic LV function. F/u w/Chandrasekhar

## 2024-03-29 NOTE — Assessment & Plan Note (Signed)
 Not better See Dr Delane for US  guided steroid inj Medrol  pack if worse (trip to [facility])

## 2024-03-29 NOTE — Assessment & Plan Note (Signed)
Continue with doxepin at night

## 2024-03-29 NOTE — Assessment & Plan Note (Signed)
 Worse  Cont on Metformin  500 mg po every day Start Mounjaro  2.5 mg/wk sq Rhylei is exercising at home 7 days a week, swimming 3 days a week, walking 12,000 steps a day.

## 2024-03-29 NOTE — Assessment & Plan Note (Signed)
 Life-long problem Linzess  helps, but too strong, too $$$ Miralax 1-4 scoops/d Dulcolax tabs or supp prn

## 2024-03-29 NOTE — Assessment & Plan Note (Signed)
Continue on citalopram.

## 2024-03-30 ENCOUNTER — Other Ambulatory Visit: Payer: Self-pay

## 2024-04-10 ENCOUNTER — Encounter: Payer: Self-pay | Admitting: Internal Medicine

## 2024-04-12 ENCOUNTER — Other Ambulatory Visit: Payer: Self-pay

## 2024-04-12 ENCOUNTER — Ambulatory Visit: Admitting: Family Medicine

## 2024-04-12 ENCOUNTER — Encounter: Payer: Self-pay | Admitting: Family Medicine

## 2024-04-12 VITALS — BP 112/76 | HR 95 | Ht 62.0 in

## 2024-04-12 DIAGNOSIS — M25551 Pain in right hip: Secondary | ICD-10-CM | POA: Diagnosis not present

## 2024-04-12 NOTE — Progress Notes (Signed)
 "        I, Leotis Batter, CMA acting as a scribe for Artist Lloyd, MD.  Evelyn Murphy is a 79 y.o. female who presents to [facility] today for exacerbation of her R hip pain. Pt was last seen by Dr. Lloyd on 06/24/22 and was given a R GT steroid injection.  Today, pt reports continue R hip pain radiating into the R LE. Notes pain in the thigh not extending beyond the knee. Sx worse with sit-to-stand and with prolonged ambulation. Minimal pain with cycling/exercise. Hip feels good. Some lower back pain with prolonged standing. Notes being on oral Prednisone  for something unrelated, nothing hurt them.   Dx imaging: 02/22/24 R hip XR 09/17/21 R hip XR   Pertinent review of systems: No fevers or chills  Relevant historical information: History of right hip greater trochanteric bursitis.  Diabetes.   Exam:  BP 112/76   Pulse 95   Ht 5' 2 (1.575 m)   SpO2 97%   BMI 24.69 kg/m  General: Well Developed, well nourished, and in no acute distress.   MSK: Right hip normal-appearing Normal motion. Tender palpation greater trochanter.  Hip abduction strength is diminished.  With isolated strength testing and resistance of the gluteus medius muscle she is painful and weak.  She had difficulty activating the gluteus medius muscle with specific strength resistance testing.    Lab and Radiology Results  Procedure: Real-time Ultrasound Guided Injection of right hip greater trochanter bursa Device: Philips Affiniti 50G/GE Logiq Images permanently stored and available for review in PACS Verbal informed consent obtained.  Discussed risks and benefits of procedure. Warned about infection, bleeding, hyperglycemia damage to structures among others. Patient expresses understanding and agreement Time-out conducted.   Noted no overlying erythema, induration, or other signs of local infection.   Skin prepped in a sterile fashion.   Local anesthesia: Topical Ethyl chloride.    With sterile technique and under real time ultrasound guidance: 40 mg of Kenalog and 2 mL of Marcaine  injected into greater trochanter bursa. Fluid seen entering the bursa.   Completed without difficulty   Pain immediately resolved suggesting accurate placement of the medication.   Advised to call if fevers/chills, erythema, induration, drainage, or persistent bleeding.   Images permanently stored and available for review in the ultrasound unit.  Impression: Technically successful ultrasound guided injection.         Assessment and Plan: 79 y.o. female with right lateral hip pain due to greater trochanteric bursitis and hip abductor tendinitis.  Plan for repeat injection today.  Previous injection was April 2024 and before that July 2023.  She is a good candidate for exercise programming.  She notes that she has had physical therapy in the in the past and did not find it to be very effective.  We did review some basic exercises today to try to really target that gluteus medius muscle and help her key to it.  She agrees to use a systems analyst at [employer] which I think is a pretty good compromise.  If needed consider formal physical therapy.   PDMP not reviewed this encounter. Orders Placed This Encounter  Procedures   US  LIMITED JOINT SPACE STRUCTURES LOW RIGHT(NO LINKED CHARGES)    Reason for Exam (SYMPTOM  OR DIAGNOSIS REQUIRED):   right LE pain / hip    Preferred imaging location?:   [facility] [place]   No orders of the defined types were placed in  this encounter.    Discussed warning signs or symptoms. Please see discharge instructions. Patient expresses understanding.   The above documentation has been reviewed and is accurate and complete Artist Lloyd, M.D.   "

## 2024-04-12 NOTE — Patient Instructions (Addendum)
 Thank you for coming in today.   You received an injection today. Seek immediate medical attention if the joint becomes red, extremely painful, or is oozing fluid.   Here is a link to the home exercises I showed you: View at [email or web address] code [id number]  Check back as needed

## 2024-04-22 ENCOUNTER — Other Ambulatory Visit: Payer: Self-pay | Admitting: Internal Medicine

## 2024-07-05 ENCOUNTER — Ambulatory Visit: Admitting: Internal Medicine
